# Patient Record
Sex: Female | Born: 1940
Health system: Southern US, Community
[De-identification: ages and names within clinical notes are randomized; demographics above are authoritative.]

## PROBLEM LIST (undated history)

## (undated) DIAGNOSIS — L905 Scar conditions and fibrosis of skin: Secondary | ICD-10-CM

## (undated) DIAGNOSIS — Z8616 Personal history of COVID-19: Secondary | ICD-10-CM

## (undated) DIAGNOSIS — M545 Low back pain, unspecified: Secondary | ICD-10-CM

## (undated) DIAGNOSIS — K219 Gastro-esophageal reflux disease without esophagitis: Secondary | ICD-10-CM

## (undated) DIAGNOSIS — M810 Age-related osteoporosis without current pathological fracture: Secondary | ICD-10-CM

## (undated) DIAGNOSIS — I10 Essential (primary) hypertension: Secondary | ICD-10-CM

## (undated) DIAGNOSIS — E782 Mixed hyperlipidemia: Secondary | ICD-10-CM

## (undated) DIAGNOSIS — E0590 Thyrotoxicosis, unspecified without thyrotoxic crisis or storm: Secondary | ICD-10-CM

## (undated) DIAGNOSIS — E559 Vitamin D deficiency, unspecified: Secondary | ICD-10-CM

## (undated) DIAGNOSIS — N189 Chronic kidney disease, unspecified: Secondary | ICD-10-CM

## (undated) DIAGNOSIS — E7800 Pure hypercholesterolemia, unspecified: Secondary | ICD-10-CM

## (undated) DIAGNOSIS — R7301 Impaired fasting glucose: Secondary | ICD-10-CM

## (undated) DIAGNOSIS — M1990 Unspecified osteoarthritis, unspecified site: Secondary | ICD-10-CM

## (undated) DIAGNOSIS — R202 Paresthesia of skin: Secondary | ICD-10-CM

## (undated) DIAGNOSIS — K2270 Barrett's esophagus without dysplasia: Secondary | ICD-10-CM

## (undated) DIAGNOSIS — K589 Irritable bowel syndrome without diarrhea: Secondary | ICD-10-CM

## (undated) DIAGNOSIS — F5101 Primary insomnia: Secondary | ICD-10-CM

## (undated) DIAGNOSIS — I129 Hypertensive chronic kidney disease with stage 1 through stage 4 chronic kidney disease, or unspecified chronic kidney disease: Secondary | ICD-10-CM

## (undated) DIAGNOSIS — H9190 Unspecified hearing loss, unspecified ear: Secondary | ICD-10-CM

## (undated) DIAGNOSIS — C801 Malignant (primary) neoplasm, unspecified: Secondary | ICD-10-CM

## (undated) HISTORY — PX: MULTIPLE TOOTH EXTRACTIONS: SHX2053

## (undated) HISTORY — DX: Paresthesia of skin: R20.2

## (undated) HISTORY — PX: APPENDECTOMY: SHX54

## (undated) HISTORY — DX: Hypertensive chronic kidney disease with stage 1 through stage 4 chronic kidney disease, or unspecified chronic kidney disease: I12.9

## (undated) HISTORY — PX: CARDIAC CATHETERIZATION: SHX172

## (undated) HISTORY — PX: CATARACT EXTRACTION W/ INTRAOCULAR LENS  IMPLANT, BILATERAL: SHX1307

## (undated) HISTORY — DX: Impaired fasting glucose: R73.01

## (undated) HISTORY — DX: Unspecified osteoarthritis, unspecified site: M19.90

## (undated) HISTORY — DX: Chronic kidney disease, unspecified: N18.9

## (undated) HISTORY — DX: Barrett's esophagus without dysplasia: K22.70

## (undated) HISTORY — DX: Personal history of COVID-19: Z86.16

## (undated) HISTORY — DX: Malignant (primary) neoplasm, unspecified: C80.1

## (undated) HISTORY — DX: Irritable bowel syndrome, unspecified: K58.9

## (undated) HISTORY — PX: COLONOSCOPY W/ BIOPSIES AND POLYPECTOMY: SHX1376

## (undated) HISTORY — DX: Scar conditions and fibrosis of skin: L90.5

## (undated) HISTORY — PX: BUNIONECTOMY: SHX129

## (undated) HISTORY — DX: Essential (primary) hypertension: I10

## (undated) HISTORY — DX: Mixed hyperlipidemia: E78.2

## (undated) HISTORY — DX: Primary insomnia: F51.01

## (undated) HISTORY — DX: Vitamin D deficiency, unspecified: E55.9

## (undated) HISTORY — DX: Age-related osteoporosis without current pathological fracture: M81.0

## (undated) HISTORY — DX: Unspecified hearing loss, unspecified ear: H91.90

## (undated) HISTORY — DX: Thyrotoxicosis, unspecified without thyrotoxic crisis or storm: E05.90

## (undated) HISTORY — DX: Low back pain, unspecified: M54.50

## (undated) HISTORY — PX: TUBAL LIGATION: SHX77

---
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
## 2011-11-15 DIAGNOSIS — I10 Essential (primary) hypertension: Secondary | ICD-10-CM | POA: Diagnosis not present

## 2011-11-15 DIAGNOSIS — R197 Diarrhea, unspecified: Secondary | ICD-10-CM | POA: Diagnosis not present

## 2012-03-12 DIAGNOSIS — M546 Pain in thoracic spine: Secondary | ICD-10-CM | POA: Diagnosis not present

## 2012-03-12 DIAGNOSIS — G4700 Insomnia, unspecified: Secondary | ICD-10-CM | POA: Diagnosis not present

## 2012-04-02 DIAGNOSIS — H18459 Nodular corneal degeneration, unspecified eye: Secondary | ICD-10-CM | POA: Diagnosis not present

## 2012-04-02 DIAGNOSIS — H2510 Age-related nuclear cataract, unspecified eye: Secondary | ICD-10-CM | POA: Diagnosis not present

## 2012-04-11 DIAGNOSIS — H18459 Nodular corneal degeneration, unspecified eye: Secondary | ICD-10-CM | POA: Diagnosis not present

## 2012-05-12 DIAGNOSIS — H2589 Other age-related cataract: Secondary | ICD-10-CM | POA: Diagnosis not present

## 2012-05-12 DIAGNOSIS — I10 Essential (primary) hypertension: Secondary | ICD-10-CM | POA: Diagnosis not present

## 2012-05-12 DIAGNOSIS — H2510 Age-related nuclear cataract, unspecified eye: Secondary | ICD-10-CM | POA: Diagnosis not present

## 2012-05-12 DIAGNOSIS — F17200 Nicotine dependence, unspecified, uncomplicated: Secondary | ICD-10-CM | POA: Diagnosis not present

## 2012-05-26 DIAGNOSIS — H2510 Age-related nuclear cataract, unspecified eye: Secondary | ICD-10-CM | POA: Diagnosis not present

## 2012-05-26 DIAGNOSIS — H2589 Other age-related cataract: Secondary | ICD-10-CM | POA: Diagnosis not present

## 2012-05-26 DIAGNOSIS — F17200 Nicotine dependence, unspecified, uncomplicated: Secondary | ICD-10-CM | POA: Diagnosis not present

## 2012-05-27 DIAGNOSIS — H2589 Other age-related cataract: Secondary | ICD-10-CM | POA: Diagnosis not present

## 2012-06-11 DIAGNOSIS — I10 Essential (primary) hypertension: Secondary | ICD-10-CM | POA: Diagnosis not present

## 2012-06-11 DIAGNOSIS — E785 Hyperlipidemia, unspecified: Secondary | ICD-10-CM | POA: Diagnosis not present

## 2012-07-30 DIAGNOSIS — I10 Essential (primary) hypertension: Secondary | ICD-10-CM | POA: Diagnosis not present

## 2012-07-30 DIAGNOSIS — N183 Chronic kidney disease, stage 3 unspecified: Secondary | ICD-10-CM | POA: Diagnosis not present

## 2012-07-30 DIAGNOSIS — M129 Arthropathy, unspecified: Secondary | ICD-10-CM | POA: Diagnosis not present

## 2012-08-01 DIAGNOSIS — N390 Urinary tract infection, site not specified: Secondary | ICD-10-CM | POA: Diagnosis not present

## 2012-08-01 DIAGNOSIS — I10 Essential (primary) hypertension: Secondary | ICD-10-CM | POA: Diagnosis not present

## 2012-08-01 DIAGNOSIS — N183 Chronic kidney disease, stage 3 unspecified: Secondary | ICD-10-CM | POA: Diagnosis not present

## 2012-08-08 DIAGNOSIS — N289 Disorder of kidney and ureter, unspecified: Secondary | ICD-10-CM | POA: Diagnosis not present

## 2012-08-08 DIAGNOSIS — N189 Chronic kidney disease, unspecified: Secondary | ICD-10-CM | POA: Diagnosis not present

## 2012-09-04 DIAGNOSIS — G4700 Insomnia, unspecified: Secondary | ICD-10-CM | POA: Diagnosis not present

## 2012-09-04 DIAGNOSIS — N182 Chronic kidney disease, stage 2 (mild): Secondary | ICD-10-CM | POA: Diagnosis not present

## 2012-09-04 DIAGNOSIS — Z23 Encounter for immunization: Secondary | ICD-10-CM | POA: Diagnosis not present

## 2012-09-04 DIAGNOSIS — I10 Essential (primary) hypertension: Secondary | ICD-10-CM | POA: Diagnosis not present

## 2012-09-09 DIAGNOSIS — H905 Unspecified sensorineural hearing loss: Secondary | ICD-10-CM | POA: Diagnosis not present

## 2012-09-09 DIAGNOSIS — H903 Sensorineural hearing loss, bilateral: Secondary | ICD-10-CM | POA: Diagnosis not present

## 2012-09-09 DIAGNOSIS — H9319 Tinnitus, unspecified ear: Secondary | ICD-10-CM | POA: Diagnosis not present

## 2012-10-29 DIAGNOSIS — N183 Chronic kidney disease, stage 3 unspecified: Secondary | ICD-10-CM | POA: Diagnosis not present

## 2012-10-29 DIAGNOSIS — I10 Essential (primary) hypertension: Secondary | ICD-10-CM | POA: Diagnosis not present

## 2012-10-29 DIAGNOSIS — M129 Arthropathy, unspecified: Secondary | ICD-10-CM | POA: Diagnosis not present

## 2012-12-11 DIAGNOSIS — N289 Disorder of kidney and ureter, unspecified: Secondary | ICD-10-CM | POA: Diagnosis not present

## 2012-12-11 DIAGNOSIS — R799 Abnormal finding of blood chemistry, unspecified: Secondary | ICD-10-CM | POA: Diagnosis not present

## 2012-12-25 DIAGNOSIS — I700 Atherosclerosis of aorta: Secondary | ICD-10-CM | POA: Diagnosis not present

## 2012-12-25 DIAGNOSIS — N269 Renal sclerosis, unspecified: Secondary | ICD-10-CM | POA: Diagnosis not present

## 2012-12-25 DIAGNOSIS — N289 Disorder of kidney and ureter, unspecified: Secondary | ICD-10-CM | POA: Diagnosis not present

## 2012-12-29 DIAGNOSIS — Z961 Presence of intraocular lens: Secondary | ICD-10-CM | POA: Diagnosis not present

## 2012-12-29 DIAGNOSIS — H26499 Other secondary cataract, unspecified eye: Secondary | ICD-10-CM | POA: Diagnosis not present

## 2013-01-01 DIAGNOSIS — H26499 Other secondary cataract, unspecified eye: Secondary | ICD-10-CM | POA: Diagnosis not present

## 2013-01-01 DIAGNOSIS — H04129 Dry eye syndrome of unspecified lacrimal gland: Secondary | ICD-10-CM | POA: Diagnosis not present

## 2013-01-05 DIAGNOSIS — R7989 Other specified abnormal findings of blood chemistry: Secondary | ICD-10-CM | POA: Diagnosis not present

## 2013-01-05 DIAGNOSIS — N2889 Other specified disorders of kidney and ureter: Secondary | ICD-10-CM | POA: Diagnosis not present

## 2013-01-05 DIAGNOSIS — N289 Disorder of kidney and ureter, unspecified: Secondary | ICD-10-CM | POA: Diagnosis not present

## 2013-01-05 DIAGNOSIS — N281 Cyst of kidney, acquired: Secondary | ICD-10-CM | POA: Diagnosis not present

## 2013-01-07 DIAGNOSIS — N289 Disorder of kidney and ureter, unspecified: Secondary | ICD-10-CM | POA: Diagnosis not present

## 2013-01-07 DIAGNOSIS — M129 Arthropathy, unspecified: Secondary | ICD-10-CM | POA: Diagnosis not present

## 2013-01-07 DIAGNOSIS — N183 Chronic kidney disease, stage 3 unspecified: Secondary | ICD-10-CM | POA: Diagnosis not present

## 2013-01-07 DIAGNOSIS — I10 Essential (primary) hypertension: Secondary | ICD-10-CM | POA: Diagnosis not present

## 2013-01-20 DIAGNOSIS — E785 Hyperlipidemia, unspecified: Secondary | ICD-10-CM | POA: Diagnosis not present

## 2013-01-20 DIAGNOSIS — H26499 Other secondary cataract, unspecified eye: Secondary | ICD-10-CM | POA: Diagnosis not present

## 2013-01-27 DIAGNOSIS — H26499 Other secondary cataract, unspecified eye: Secondary | ICD-10-CM | POA: Diagnosis not present

## 2013-01-27 DIAGNOSIS — Z9849 Cataract extraction status, unspecified eye: Secondary | ICD-10-CM | POA: Diagnosis not present

## 2013-01-27 DIAGNOSIS — Z961 Presence of intraocular lens: Secondary | ICD-10-CM | POA: Diagnosis not present

## 2013-03-29 DIAGNOSIS — N390 Urinary tract infection, site not specified: Secondary | ICD-10-CM | POA: Diagnosis not present

## 2013-03-29 DIAGNOSIS — R112 Nausea with vomiting, unspecified: Secondary | ICD-10-CM | POA: Diagnosis not present

## 2013-03-29 DIAGNOSIS — R197 Diarrhea, unspecified: Secondary | ICD-10-CM | POA: Diagnosis not present

## 2013-03-29 DIAGNOSIS — R300 Dysuria: Secondary | ICD-10-CM | POA: Diagnosis not present

## 2013-06-04 DIAGNOSIS — D485 Neoplasm of uncertain behavior of skin: Secondary | ICD-10-CM | POA: Diagnosis not present

## 2013-06-04 DIAGNOSIS — N182 Chronic kidney disease, stage 2 (mild): Secondary | ICD-10-CM | POA: Diagnosis not present

## 2013-06-04 DIAGNOSIS — E785 Hyperlipidemia, unspecified: Secondary | ICD-10-CM | POA: Diagnosis not present

## 2013-06-04 DIAGNOSIS — I10 Essential (primary) hypertension: Secondary | ICD-10-CM | POA: Diagnosis not present

## 2013-06-04 DIAGNOSIS — M47812 Spondylosis without myelopathy or radiculopathy, cervical region: Secondary | ICD-10-CM | POA: Diagnosis not present

## 2013-06-04 DIAGNOSIS — M542 Cervicalgia: Secondary | ICD-10-CM | POA: Diagnosis not present

## 2013-06-09 DIAGNOSIS — M47812 Spondylosis without myelopathy or radiculopathy, cervical region: Secondary | ICD-10-CM | POA: Diagnosis not present

## 2013-06-09 DIAGNOSIS — M542 Cervicalgia: Secondary | ICD-10-CM | POA: Diagnosis not present

## 2013-06-11 DIAGNOSIS — D485 Neoplasm of uncertain behavior of skin: Secondary | ICD-10-CM | POA: Diagnosis not present

## 2013-06-11 DIAGNOSIS — M542 Cervicalgia: Secondary | ICD-10-CM | POA: Diagnosis not present

## 2013-06-11 DIAGNOSIS — M47812 Spondylosis without myelopathy or radiculopathy, cervical region: Secondary | ICD-10-CM | POA: Diagnosis not present

## 2013-06-11 DIAGNOSIS — L821 Other seborrheic keratosis: Secondary | ICD-10-CM | POA: Diagnosis not present

## 2013-06-16 DIAGNOSIS — M542 Cervicalgia: Secondary | ICD-10-CM | POA: Diagnosis not present

## 2013-06-16 DIAGNOSIS — M47812 Spondylosis without myelopathy or radiculopathy, cervical region: Secondary | ICD-10-CM | POA: Diagnosis not present

## 2013-06-18 DIAGNOSIS — M542 Cervicalgia: Secondary | ICD-10-CM | POA: Diagnosis not present

## 2013-06-18 DIAGNOSIS — M47812 Spondylosis without myelopathy or radiculopathy, cervical region: Secondary | ICD-10-CM | POA: Diagnosis not present

## 2013-06-25 DIAGNOSIS — M542 Cervicalgia: Secondary | ICD-10-CM | POA: Diagnosis not present

## 2013-06-25 DIAGNOSIS — M47812 Spondylosis without myelopathy or radiculopathy, cervical region: Secondary | ICD-10-CM | POA: Diagnosis not present

## 2013-08-14 DIAGNOSIS — F17200 Nicotine dependence, unspecified, uncomplicated: Secondary | ICD-10-CM | POA: Diagnosis not present

## 2013-08-14 DIAGNOSIS — L84 Corns and callosities: Secondary | ICD-10-CM | POA: Diagnosis not present

## 2013-08-14 DIAGNOSIS — I10 Essential (primary) hypertension: Secondary | ICD-10-CM | POA: Diagnosis not present

## 2013-08-14 DIAGNOSIS — E7800 Pure hypercholesterolemia, unspecified: Secondary | ICD-10-CM | POA: Diagnosis not present

## 2013-08-14 DIAGNOSIS — G5600 Carpal tunnel syndrome, unspecified upper limb: Secondary | ICD-10-CM | POA: Diagnosis not present

## 2013-08-14 DIAGNOSIS — Z1211 Encounter for screening for malignant neoplasm of colon: Secondary | ICD-10-CM | POA: Diagnosis not present

## 2013-08-14 DIAGNOSIS — Z1239 Encounter for other screening for malignant neoplasm of breast: Secondary | ICD-10-CM | POA: Diagnosis not present

## 2013-08-14 DIAGNOSIS — Z13820 Encounter for screening for osteoporosis: Secondary | ICD-10-CM | POA: Diagnosis not present

## 2013-09-09 DIAGNOSIS — M810 Age-related osteoporosis without current pathological fracture: Secondary | ICD-10-CM | POA: Diagnosis not present

## 2013-09-09 DIAGNOSIS — Z1231 Encounter for screening mammogram for malignant neoplasm of breast: Secondary | ICD-10-CM | POA: Diagnosis not present

## 2013-09-09 DIAGNOSIS — M899 Disorder of bone, unspecified: Secondary | ICD-10-CM | POA: Diagnosis not present

## 2013-09-10 ENCOUNTER — Encounter (INDEPENDENT_AMBULATORY_CARE_PROVIDER_SITE_OTHER): Payer: Self-pay

## 2013-09-10 ENCOUNTER — Ambulatory Visit (INDEPENDENT_AMBULATORY_CARE_PROVIDER_SITE_OTHER): Payer: Medicare Other

## 2013-09-10 VITALS — BP 127/68 | HR 77 | Resp 18

## 2013-09-10 DIAGNOSIS — Q828 Other specified congenital malformations of skin: Secondary | ICD-10-CM | POA: Diagnosis not present

## 2013-09-10 DIAGNOSIS — M79672 Pain in left foot: Secondary | ICD-10-CM

## 2013-09-10 DIAGNOSIS — M2040 Other hammer toe(s) (acquired), unspecified foot: Secondary | ICD-10-CM

## 2013-09-10 DIAGNOSIS — M79609 Pain in unspecified limb: Secondary | ICD-10-CM

## 2013-09-10 DIAGNOSIS — M19079 Primary osteoarthritis, unspecified ankle and foot: Secondary | ICD-10-CM | POA: Diagnosis not present

## 2013-09-10 DIAGNOSIS — M79671 Pain in right foot: Secondary | ICD-10-CM

## 2013-09-10 DIAGNOSIS — M19071 Primary osteoarthritis, right ankle and foot: Secondary | ICD-10-CM

## 2013-09-10 NOTE — Progress Notes (Signed)
  Subjective:    Patient ID: Pamela Schwartz, female    DOB: [birth year], 72 y.o.   MRN: [id number]  HPI corns are on both 2nd toes and no burning or throbing, hurts if standing a lot and rubbing against each other and been going on for about middle of august of this year Patient has a previous history of bunion surgery correction with screw fixations done several years ago in [place]. Patient is also hammertoe repair second digits bilateral however has residual medial and dorsal displacement of the second toes with successful fusion on the left second toe, however the right second toe has a nonunion site. Both second toes have medial keratoses secondary to irritation against the hallux. Patient has adductovarus rotation is of all lesser digits 234 and 5.   Review of Systems  Constitutional: Negative.   HENT:       Ringing in the ears  Eyes: Negative.   Respiratory: Negative.   Cardiovascular: Negative.   Gastrointestinal: Negative.   Endocrine: Negative.   Genitourinary: Negative.   Musculoskeletal: Negative.   Skin: Negative.   Allergic/Immunologic: Negative.   Neurological: Negative.   Hematological: Negative.   Psychiatric/Behavioral: Negative.        Objective:   Physical Exam  Vitals reviewed. Constitutional: She is oriented to person, place, and time. She appears well-developed and well-nourished.  Cardiovascular:  Pulses:      Dorsalis pedis pulses are 2+ on the right side, and 2+ on the left side.       Posterior tibial pulses are 2+ on the right side, and 2+ on the left side.  Musculoskeletal:  Hallux is rectus with a medial healed incision line from previous bunionectomy. Bilateral. Patient's had previous hammertoe repairs with a nonunion second digit IP joint right. Has medial displacements at the MTP joints digits 234 and 5. Bilateral  Neurological: She is alert and oriented to person, place, and time. She has normal strength and normal reflexes.  Epicritic and  proprioceptive sensations intact and symmetric bilateral. Normal plantar response and DTRs noted.  Skin: Skin is warm and dry. No cyanosis. Nails show no clubbing.  Skin color pigment normal. Well-healed incisions noted on the hallux and second digits bilateral. They're keratoses medially second digit proximal IP joints bilateral  Psychiatric: She has a normal mood and affect. Her behavior is normal.          Assessment & Plan:  Assessment this time his result HAV deformity bilateral with successful surgery however has residual keratoses second digit to medial displacements at the MTP joints digits 234 and 5. Patient demonstrates and osteoarthropathy the forefoot midfoot and radiographically. There is adductovarus rotation of lesser digits 34 and 5 bilateral medial displacements at the MTP joints bilateral. May recommendations for a straight rather than curved last shoes. Dispensed some tube foam silicone toe shields for the second toes bilateral following debridement of keratoses both second toes.  Patient may be future candidate for hammertoe review with screw fixation second toe right foot in the future. Patient may also be candidate for tenotomy capsulotomy procedures lesser digits 345 if she continues to have difficulties. Followup on an as-needed basis if symptoms persist. May also be candidate for future palliative care and as-needed basis  Harriet Masson DPM

## 2013-09-10 NOTE — Patient Instructions (Signed)
Osteoarthritis Osteoarthritis is the most common form of arthritis. It is redness, soreness, and swelling (inflammation) affecting the cartilage. Cartilage acts as a cushion, covering the ends of bones where they meet to form a joint. CAUSES  Over time, the cartilage begins to wear away. This causes bone to rub on bone. This produces pain and stiffness in the affected joints. Factors that contribute to this problem are:  Excessive body weight.  Age.  Overuse of joints. SYMPTOMS   People with osteoarthritis usually experience joint pain, swelling, or stiffness.  Over time, the joint may lose its normal shape.  Small deposits of bone (osteophytes) may grow on the edges of the joint.  Bits of bone or cartilage can break off and float inside the joint space. This may cause more pain and damage.  Osteoarthritis can lead to depression, anxiety, feelings of helplessness, and limitations on daily activities. The most commonly affected joints are in the:  Ends of the fingers.  Thumbs.  Neck.  Lower back.  Knees.  Hips. DIAGNOSIS  Diagnosis is mostly based on your symptoms and exam. Tests may be helpful, including:  X-rays of the affected joint.  A computerized magnetic scan (MRI).  Blood tests to rule out other types of arthritis.  Joint fluid tests. This involves using a needle to draw fluid from the joint and examining the fluid under a microscope. TREATMENT  Goals of treatment are to control pain, improve joint function, maintain a normal body weight, and maintain a healthy lifestyle. Treatment approaches may include:  A prescribed exercise program with rest and joint relief.  Weight control with nutritional education.  Pain relief techniques such as:  Properly applied heat and cold.  Electric pulses delivered to nerve endings under the skin (transcutaneous electrical nerve stimulation, TENS).  Massage.  Certain supplements. Ask your caregiver before using any  supplements, especially in combination with prescribed drugs.  Medicines to control pain, such as:  Acetaminophen.  Nonsteroidal anti-inflammatory drugs (NSAIDs), such as naproxen.  Narcotic or central-acting agents, such as tramadol. This drug carries a risk of addiction and is generally prescribed for short-term use.  Corticosteroids. These can be given orally or as injection. This is a short-term treatment, not recommended for routine use.  Surgery to reposition the bones and relieve pain (osteotomy) or to remove loose pieces of bone and cartilage. Joint replacement may be needed in advanced states of osteoarthritis. HOME CARE INSTRUCTIONS  Your caregiver can recommend specific types of exercise. These may include:  Strengthening exercises. These are done to strengthen the muscles that support joints affected by arthritis. They can be performed with weights or with exercise bands to add resistance.  Aerobic activities. These are exercises, such as brisk walking or low-impact aerobics, that get your heart pumping. They can help keep your lungs and circulatory system in shape.  Range-of-motion activities. These keep your joints limber.  Balance and agility exercises. These help you maintain daily living skills. Learning about your condition and being actively involved in your care will help improve the course of your osteoarthritis. SEEK MEDICAL CARE IF:   You feel hot or your skin turns red.  You develop a rash in addition to your joint pain.  You have an oral temperature above 102 F (38.9 C). FOR MORE INFORMATION  National Institute of Arthritis and Musculoskeletal and Skin Diseases: www.niams.nih.gov National Institute on Aging: www.nia.nih.gov American College of Rheumatology: www.rheumatology.org Document Released: 10/29/2005 Document Revised: 01/21/2012 Document Reviewed: 02/09/2010 [employer] Patient Information 2014 [employer].  

## 2013-09-22 DIAGNOSIS — M542 Cervicalgia: Secondary | ICD-10-CM | POA: Diagnosis not present

## 2013-09-22 DIAGNOSIS — I10 Essential (primary) hypertension: Secondary | ICD-10-CM | POA: Diagnosis not present

## 2013-09-22 DIAGNOSIS — G5600 Carpal tunnel syndrome, unspecified upper limb: Secondary | ICD-10-CM | POA: Diagnosis not present

## 2013-09-22 DIAGNOSIS — M810 Age-related osteoporosis without current pathological fracture: Secondary | ICD-10-CM | POA: Diagnosis not present

## 2013-09-22 DIAGNOSIS — Z0000 Encounter for general adult medical examination without abnormal findings: Secondary | ICD-10-CM | POA: Diagnosis not present

## 2013-09-22 DIAGNOSIS — F17200 Nicotine dependence, unspecified, uncomplicated: Secondary | ICD-10-CM | POA: Diagnosis not present

## 2013-09-22 DIAGNOSIS — M216X9 Other acquired deformities of unspecified foot: Secondary | ICD-10-CM | POA: Diagnosis not present

## 2013-10-12 DIAGNOSIS — M542 Cervicalgia: Secondary | ICD-10-CM | POA: Diagnosis not present

## 2013-10-15 DIAGNOSIS — M542 Cervicalgia: Secondary | ICD-10-CM | POA: Diagnosis not present

## 2013-10-21 DIAGNOSIS — M542 Cervicalgia: Secondary | ICD-10-CM | POA: Diagnosis not present

## 2013-10-23 DIAGNOSIS — M542 Cervicalgia: Secondary | ICD-10-CM | POA: Diagnosis not present

## 2013-10-27 DIAGNOSIS — M542 Cervicalgia: Secondary | ICD-10-CM | POA: Diagnosis not present

## 2013-12-08 DIAGNOSIS — M542 Cervicalgia: Secondary | ICD-10-CM | POA: Diagnosis not present

## 2013-12-08 DIAGNOSIS — E7800 Pure hypercholesterolemia, unspecified: Secondary | ICD-10-CM | POA: Diagnosis not present

## 2013-12-08 DIAGNOSIS — M6530 Trigger finger, unspecified finger: Secondary | ICD-10-CM | POA: Diagnosis not present

## 2013-12-08 DIAGNOSIS — E559 Vitamin D deficiency, unspecified: Secondary | ICD-10-CM | POA: Diagnosis not present

## 2013-12-08 DIAGNOSIS — I10 Essential (primary) hypertension: Secondary | ICD-10-CM | POA: Diagnosis not present

## 2013-12-08 DIAGNOSIS — Z79899 Other long term (current) drug therapy: Secondary | ICD-10-CM | POA: Diagnosis not present

## 2013-12-08 DIAGNOSIS — G5600 Carpal tunnel syndrome, unspecified upper limb: Secondary | ICD-10-CM | POA: Diagnosis not present

## 2014-02-17 DIAGNOSIS — M940 Chondrocostal junction syndrome [Tietze]: Secondary | ICD-10-CM | POA: Diagnosis not present

## 2014-03-23 DIAGNOSIS — I10 Essential (primary) hypertension: Secondary | ICD-10-CM | POA: Diagnosis not present

## 2014-03-23 DIAGNOSIS — M542 Cervicalgia: Secondary | ICD-10-CM | POA: Diagnosis not present

## 2014-03-23 DIAGNOSIS — E538 Deficiency of other specified B group vitamins: Secondary | ICD-10-CM | POA: Diagnosis not present

## 2014-03-23 DIAGNOSIS — E7800 Pure hypercholesterolemia, unspecified: Secondary | ICD-10-CM | POA: Diagnosis not present

## 2014-03-23 DIAGNOSIS — E782 Mixed hyperlipidemia: Secondary | ICD-10-CM | POA: Diagnosis not present

## 2014-03-23 DIAGNOSIS — E559 Vitamin D deficiency, unspecified: Secondary | ICD-10-CM | POA: Diagnosis not present

## 2014-05-18 DIAGNOSIS — N281 Cyst of kidney, acquired: Secondary | ICD-10-CM | POA: Diagnosis not present

## 2014-05-18 DIAGNOSIS — R1011 Right upper quadrant pain: Secondary | ICD-10-CM | POA: Diagnosis not present

## 2014-05-18 DIAGNOSIS — R1013 Epigastric pain: Secondary | ICD-10-CM | POA: Diagnosis not present

## 2014-05-28 DIAGNOSIS — R1013 Epigastric pain: Secondary | ICD-10-CM | POA: Diagnosis not present

## 2014-05-28 DIAGNOSIS — R112 Nausea with vomiting, unspecified: Secondary | ICD-10-CM | POA: Diagnosis not present

## 2014-06-15 DIAGNOSIS — R112 Nausea with vomiting, unspecified: Secondary | ICD-10-CM | POA: Diagnosis not present

## 2014-06-15 DIAGNOSIS — K589 Irritable bowel syndrome without diarrhea: Secondary | ICD-10-CM | POA: Diagnosis not present

## 2014-06-15 DIAGNOSIS — R1013 Epigastric pain: Secondary | ICD-10-CM | POA: Diagnosis not present

## 2014-06-21 DIAGNOSIS — K2270 Barrett's esophagus without dysplasia: Secondary | ICD-10-CM | POA: Diagnosis not present

## 2014-06-21 DIAGNOSIS — D130 Benign neoplasm of esophagus: Secondary | ICD-10-CM | POA: Diagnosis not present

## 2014-06-21 DIAGNOSIS — N189 Chronic kidney disease, unspecified: Secondary | ICD-10-CM | POA: Diagnosis not present

## 2014-06-21 DIAGNOSIS — K449 Diaphragmatic hernia without obstruction or gangrene: Secondary | ICD-10-CM | POA: Diagnosis not present

## 2014-06-21 DIAGNOSIS — Z7982 Long term (current) use of aspirin: Secondary | ICD-10-CM | POA: Diagnosis not present

## 2014-06-21 DIAGNOSIS — E559 Vitamin D deficiency, unspecified: Secondary | ICD-10-CM | POA: Diagnosis not present

## 2014-06-21 DIAGNOSIS — D1330 Benign neoplasm of unspecified part of small intestine: Secondary | ICD-10-CM | POA: Diagnosis not present

## 2014-06-21 DIAGNOSIS — Z79899 Other long term (current) drug therapy: Secondary | ICD-10-CM | POA: Diagnosis not present

## 2014-06-21 DIAGNOSIS — M810 Age-related osteoporosis without current pathological fracture: Secondary | ICD-10-CM | POA: Diagnosis not present

## 2014-06-21 DIAGNOSIS — K589 Irritable bowel syndrome without diarrhea: Secondary | ICD-10-CM | POA: Diagnosis not present

## 2014-06-21 DIAGNOSIS — K2970 Gastritis, unspecified, without bleeding: Secondary | ICD-10-CM | POA: Diagnosis not present

## 2014-06-21 DIAGNOSIS — F17200 Nicotine dependence, unspecified, uncomplicated: Secondary | ICD-10-CM | POA: Diagnosis not present

## 2014-06-21 DIAGNOSIS — R112 Nausea with vomiting, unspecified: Secondary | ICD-10-CM | POA: Diagnosis not present

## 2014-06-21 DIAGNOSIS — H547 Unspecified visual loss: Secondary | ICD-10-CM | POA: Diagnosis not present

## 2014-06-21 DIAGNOSIS — R1013 Epigastric pain: Secondary | ICD-10-CM | POA: Diagnosis not present

## 2014-06-21 DIAGNOSIS — I129 Hypertensive chronic kidney disease with stage 1 through stage 4 chronic kidney disease, or unspecified chronic kidney disease: Secondary | ICD-10-CM | POA: Diagnosis not present

## 2014-06-28 DIAGNOSIS — E7800 Pure hypercholesterolemia, unspecified: Secondary | ICD-10-CM | POA: Diagnosis not present

## 2014-06-28 DIAGNOSIS — R197 Diarrhea, unspecified: Secondary | ICD-10-CM | POA: Diagnosis not present

## 2014-06-28 DIAGNOSIS — K2270 Barrett's esophagus without dysplasia: Secondary | ICD-10-CM | POA: Diagnosis not present

## 2014-06-28 DIAGNOSIS — E559 Vitamin D deficiency, unspecified: Secondary | ICD-10-CM | POA: Diagnosis not present

## 2014-06-28 DIAGNOSIS — I10 Essential (primary) hypertension: Secondary | ICD-10-CM | POA: Diagnosis not present

## 2014-06-28 DIAGNOSIS — K589 Irritable bowel syndrome without diarrhea: Secondary | ICD-10-CM | POA: Diagnosis not present

## 2014-07-12 DIAGNOSIS — I129 Hypertensive chronic kidney disease with stage 1 through stage 4 chronic kidney disease, or unspecified chronic kidney disease: Secondary | ICD-10-CM | POA: Diagnosis not present

## 2014-07-12 DIAGNOSIS — D128 Benign neoplasm of rectum: Secondary | ICD-10-CM | POA: Diagnosis not present

## 2014-07-12 DIAGNOSIS — D175 Benign lipomatous neoplasm of intra-abdominal organs: Secondary | ICD-10-CM | POA: Diagnosis not present

## 2014-07-12 DIAGNOSIS — K5730 Diverticulosis of large intestine without perforation or abscess without bleeding: Secondary | ICD-10-CM | POA: Diagnosis not present

## 2014-07-12 DIAGNOSIS — D126 Benign neoplasm of colon, unspecified: Secondary | ICD-10-CM | POA: Diagnosis not present

## 2014-07-12 DIAGNOSIS — M810 Age-related osteoporosis without current pathological fracture: Secondary | ICD-10-CM | POA: Diagnosis not present

## 2014-07-12 DIAGNOSIS — Z1211 Encounter for screening for malignant neoplasm of colon: Secondary | ICD-10-CM | POA: Diagnosis not present

## 2014-07-12 DIAGNOSIS — N189 Chronic kidney disease, unspecified: Secondary | ICD-10-CM | POA: Diagnosis not present

## 2014-07-12 DIAGNOSIS — D129 Benign neoplasm of anus and anal canal: Secondary | ICD-10-CM | POA: Diagnosis not present

## 2014-07-12 DIAGNOSIS — Z79899 Other long term (current) drug therapy: Secondary | ICD-10-CM | POA: Diagnosis not present

## 2014-07-12 DIAGNOSIS — F17200 Nicotine dependence, unspecified, uncomplicated: Secondary | ICD-10-CM | POA: Diagnosis not present

## 2014-07-12 DIAGNOSIS — Z7982 Long term (current) use of aspirin: Secondary | ICD-10-CM | POA: Diagnosis not present

## 2014-07-12 DIAGNOSIS — R1013 Epigastric pain: Secondary | ICD-10-CM | POA: Diagnosis not present

## 2014-07-12 DIAGNOSIS — K589 Irritable bowel syndrome without diarrhea: Secondary | ICD-10-CM | POA: Diagnosis not present

## 2014-07-12 DIAGNOSIS — K648 Other hemorrhoids: Secondary | ICD-10-CM | POA: Diagnosis not present

## 2014-07-12 DIAGNOSIS — E559 Vitamin D deficiency, unspecified: Secondary | ICD-10-CM | POA: Diagnosis not present

## 2014-07-13 DIAGNOSIS — Z23 Encounter for immunization: Secondary | ICD-10-CM | POA: Diagnosis not present

## 2014-08-19 DIAGNOSIS — H3531 Nonexudative age-related macular degeneration: Secondary | ICD-10-CM | POA: Diagnosis not present

## 2014-10-05 DIAGNOSIS — F17210 Nicotine dependence, cigarettes, uncomplicated: Secondary | ICD-10-CM | POA: Diagnosis not present

## 2014-10-05 DIAGNOSIS — E780 Pure hypercholesterolemia: Secondary | ICD-10-CM | POA: Diagnosis not present

## 2014-10-05 DIAGNOSIS — Z1231 Encounter for screening mammogram for malignant neoplasm of breast: Secondary | ICD-10-CM | POA: Diagnosis not present

## 2014-10-05 DIAGNOSIS — K580 Irritable bowel syndrome with diarrhea: Secondary | ICD-10-CM | POA: Diagnosis not present

## 2014-10-05 DIAGNOSIS — K2270 Barrett's esophagus without dysplasia: Secondary | ICD-10-CM | POA: Diagnosis not present

## 2014-10-05 DIAGNOSIS — I10 Essential (primary) hypertension: Secondary | ICD-10-CM | POA: Diagnosis not present

## 2014-10-05 DIAGNOSIS — E559 Vitamin D deficiency, unspecified: Secondary | ICD-10-CM | POA: Diagnosis not present

## 2014-10-19 DIAGNOSIS — Z23 Encounter for immunization: Secondary | ICD-10-CM | POA: Diagnosis not present

## 2014-10-20 DIAGNOSIS — Z1231 Encounter for screening mammogram for malignant neoplasm of breast: Secondary | ICD-10-CM | POA: Diagnosis not present

## 2014-12-09 DIAGNOSIS — I493 Ventricular premature depolarization: Secondary | ICD-10-CM | POA: Diagnosis not present

## 2014-12-09 DIAGNOSIS — Z0000 Encounter for general adult medical examination without abnormal findings: Secondary | ICD-10-CM | POA: Diagnosis not present

## 2014-12-09 DIAGNOSIS — M810 Age-related osteoporosis without current pathological fracture: Secondary | ICD-10-CM | POA: Diagnosis not present

## 2014-12-09 DIAGNOSIS — F17218 Nicotine dependence, cigarettes, with other nicotine-induced disorders: Secondary | ICD-10-CM | POA: Diagnosis not present

## 2014-12-09 DIAGNOSIS — R05 Cough: Secondary | ICD-10-CM | POA: Diagnosis not present

## 2014-12-09 DIAGNOSIS — F17210 Nicotine dependence, cigarettes, uncomplicated: Secondary | ICD-10-CM | POA: Diagnosis not present

## 2014-12-13 DIAGNOSIS — R05 Cough: Secondary | ICD-10-CM | POA: Diagnosis not present

## 2014-12-13 DIAGNOSIS — F17200 Nicotine dependence, unspecified, uncomplicated: Secondary | ICD-10-CM | POA: Diagnosis not present

## 2014-12-27 DIAGNOSIS — R21 Rash and other nonspecific skin eruption: Secondary | ICD-10-CM | POA: Diagnosis not present

## 2015-01-11 DIAGNOSIS — I10 Essential (primary) hypertension: Secondary | ICD-10-CM | POA: Diagnosis not present

## 2015-01-11 DIAGNOSIS — F17210 Nicotine dependence, cigarettes, uncomplicated: Secondary | ICD-10-CM | POA: Diagnosis not present

## 2015-01-11 DIAGNOSIS — R21 Rash and other nonspecific skin eruption: Secondary | ICD-10-CM | POA: Diagnosis not present

## 2015-01-11 DIAGNOSIS — E559 Vitamin D deficiency, unspecified: Secondary | ICD-10-CM | POA: Diagnosis not present

## 2015-01-11 DIAGNOSIS — E780 Pure hypercholesterolemia: Secondary | ICD-10-CM | POA: Diagnosis not present

## 2015-01-11 DIAGNOSIS — I493 Ventricular premature depolarization: Secondary | ICD-10-CM | POA: Diagnosis not present

## 2015-01-11 DIAGNOSIS — K580 Irritable bowel syndrome with diarrhea: Secondary | ICD-10-CM | POA: Diagnosis not present

## 2015-01-11 DIAGNOSIS — K2270 Barrett's esophagus without dysplasia: Secondary | ICD-10-CM | POA: Diagnosis not present

## 2015-02-08 DIAGNOSIS — L02215 Cutaneous abscess of perineum: Secondary | ICD-10-CM | POA: Diagnosis not present

## 2015-02-08 DIAGNOSIS — L03319 Cellulitis of trunk, unspecified: Secondary | ICD-10-CM | POA: Diagnosis not present

## 2015-02-11 DIAGNOSIS — L02215 Cutaneous abscess of perineum: Secondary | ICD-10-CM | POA: Diagnosis not present

## 2015-04-15 DIAGNOSIS — J209 Acute bronchitis, unspecified: Secondary | ICD-10-CM | POA: Diagnosis not present

## 2015-04-15 DIAGNOSIS — J0100 Acute maxillary sinusitis, unspecified: Secondary | ICD-10-CM | POA: Diagnosis not present

## 2015-04-18 DIAGNOSIS — R05 Cough: Secondary | ICD-10-CM | POA: Diagnosis not present

## 2015-04-18 DIAGNOSIS — J4541 Moderate persistent asthma with (acute) exacerbation: Secondary | ICD-10-CM | POA: Diagnosis not present

## 2015-04-18 DIAGNOSIS — J0100 Acute maxillary sinusitis, unspecified: Secondary | ICD-10-CM | POA: Diagnosis not present

## 2015-04-18 DIAGNOSIS — J209 Acute bronchitis, unspecified: Secondary | ICD-10-CM | POA: Diagnosis not present

## 2015-04-21 DIAGNOSIS — I10 Essential (primary) hypertension: Secondary | ICD-10-CM | POA: Diagnosis not present

## 2015-04-21 DIAGNOSIS — I493 Ventricular premature depolarization: Secondary | ICD-10-CM | POA: Diagnosis not present

## 2015-04-21 DIAGNOSIS — M542 Cervicalgia: Secondary | ICD-10-CM | POA: Diagnosis not present

## 2015-04-21 DIAGNOSIS — E780 Pure hypercholesterolemia: Secondary | ICD-10-CM | POA: Diagnosis not present

## 2015-04-21 DIAGNOSIS — J4541 Moderate persistent asthma with (acute) exacerbation: Secondary | ICD-10-CM | POA: Diagnosis not present

## 2015-04-21 DIAGNOSIS — J3089 Other allergic rhinitis: Secondary | ICD-10-CM | POA: Diagnosis not present

## 2015-05-26 DIAGNOSIS — H524 Presbyopia: Secondary | ICD-10-CM | POA: Diagnosis not present

## 2015-05-26 DIAGNOSIS — H3531 Nonexudative age-related macular degeneration: Secondary | ICD-10-CM | POA: Diagnosis not present

## 2015-07-25 DIAGNOSIS — E780 Pure hypercholesterolemia: Secondary | ICD-10-CM | POA: Diagnosis not present

## 2015-07-25 DIAGNOSIS — I10 Essential (primary) hypertension: Secondary | ICD-10-CM | POA: Diagnosis not present

## 2015-07-25 DIAGNOSIS — R7301 Impaired fasting glucose: Secondary | ICD-10-CM | POA: Diagnosis not present

## 2015-07-28 DIAGNOSIS — S61011A Laceration without foreign body of right thumb without damage to nail, initial encounter: Secondary | ICD-10-CM | POA: Diagnosis not present

## 2015-07-28 DIAGNOSIS — I10 Essential (primary) hypertension: Secondary | ICD-10-CM | POA: Diagnosis not present

## 2015-07-28 DIAGNOSIS — E780 Pure hypercholesterolemia: Secondary | ICD-10-CM | POA: Diagnosis not present

## 2015-07-28 DIAGNOSIS — G5600 Carpal tunnel syndrome, unspecified upper limb: Secondary | ICD-10-CM | POA: Diagnosis not present

## 2015-07-28 DIAGNOSIS — Z23 Encounter for immunization: Secondary | ICD-10-CM | POA: Diagnosis not present

## 2015-07-28 DIAGNOSIS — M542 Cervicalgia: Secondary | ICD-10-CM | POA: Diagnosis not present

## 2015-07-28 DIAGNOSIS — R7301 Impaired fasting glucose: Secondary | ICD-10-CM | POA: Diagnosis not present

## 2015-08-29 DIAGNOSIS — I10 Essential (primary) hypertension: Secondary | ICD-10-CM | POA: Diagnosis not present

## 2015-08-29 DIAGNOSIS — R001 Bradycardia, unspecified: Secondary | ICD-10-CM | POA: Diagnosis not present

## 2015-08-29 DIAGNOSIS — R0789 Other chest pain: Secondary | ICD-10-CM | POA: Diagnosis not present

## 2015-08-29 DIAGNOSIS — M542 Cervicalgia: Secondary | ICD-10-CM | POA: Diagnosis not present

## 2015-08-30 DIAGNOSIS — R001 Bradycardia, unspecified: Secondary | ICD-10-CM | POA: Diagnosis not present

## 2015-09-13 DIAGNOSIS — R0789 Other chest pain: Secondary | ICD-10-CM | POA: Diagnosis not present

## 2015-09-30 DIAGNOSIS — M47812 Spondylosis without myelopathy or radiculopathy, cervical region: Secondary | ICD-10-CM | POA: Diagnosis not present

## 2015-09-30 DIAGNOSIS — M542 Cervicalgia: Secondary | ICD-10-CM | POA: Diagnosis not present

## 2015-10-03 DIAGNOSIS — J3089 Other allergic rhinitis: Secondary | ICD-10-CM | POA: Diagnosis not present

## 2015-10-03 DIAGNOSIS — M50123 Cervical disc disorder at C6-C7 level with radiculopathy: Secondary | ICD-10-CM | POA: Diagnosis not present

## 2015-10-03 DIAGNOSIS — R0789 Other chest pain: Secondary | ICD-10-CM | POA: Diagnosis not present

## 2015-10-03 DIAGNOSIS — I10 Essential (primary) hypertension: Secondary | ICD-10-CM | POA: Diagnosis not present

## 2015-10-03 DIAGNOSIS — R008 Other abnormalities of heart beat: Secondary | ICD-10-CM | POA: Diagnosis not present

## 2015-10-03 DIAGNOSIS — M50122 Cervical disc disorder at C5-C6 level with radiculopathy: Secondary | ICD-10-CM | POA: Diagnosis not present

## 2015-10-13 DIAGNOSIS — M47817 Spondylosis without myelopathy or radiculopathy, lumbosacral region: Secondary | ICD-10-CM | POA: Diagnosis not present

## 2015-10-13 DIAGNOSIS — M50122 Cervical disc disorder at C5-C6 level with radiculopathy: Secondary | ICD-10-CM | POA: Diagnosis not present

## 2015-10-13 DIAGNOSIS — M50123 Cervical disc disorder at C6-C7 level with radiculopathy: Secondary | ICD-10-CM | POA: Diagnosis not present

## 2015-10-13 DIAGNOSIS — M47892 Other spondylosis, cervical region: Secondary | ICD-10-CM | POA: Diagnosis not present

## 2015-10-19 DIAGNOSIS — E042 Nontoxic multinodular goiter: Secondary | ICD-10-CM | POA: Diagnosis not present

## 2015-10-20 DIAGNOSIS — Z6825 Body mass index (BMI) 25.0-25.9, adult: Secondary | ICD-10-CM | POA: Diagnosis not present

## 2015-10-20 DIAGNOSIS — M5412 Radiculopathy, cervical region: Secondary | ICD-10-CM | POA: Diagnosis not present

## 2015-10-31 DIAGNOSIS — E041 Nontoxic single thyroid nodule: Secondary | ICD-10-CM | POA: Diagnosis not present

## 2015-10-31 DIAGNOSIS — E042 Nontoxic multinodular goiter: Secondary | ICD-10-CM | POA: Diagnosis not present

## 2015-11-22 DIAGNOSIS — M5412 Radiculopathy, cervical region: Secondary | ICD-10-CM | POA: Diagnosis not present

## 2015-12-12 DIAGNOSIS — Z0000 Encounter for general adult medical examination without abnormal findings: Secondary | ICD-10-CM | POA: Diagnosis not present

## 2015-12-12 DIAGNOSIS — L658 Other specified nonscarring hair loss: Secondary | ICD-10-CM | POA: Diagnosis not present

## 2015-12-12 DIAGNOSIS — E782 Mixed hyperlipidemia: Secondary | ICD-10-CM | POA: Diagnosis not present

## 2015-12-12 DIAGNOSIS — I7389 Other specified peripheral vascular diseases: Secondary | ICD-10-CM | POA: Diagnosis not present

## 2015-12-12 DIAGNOSIS — I10 Essential (primary) hypertension: Secondary | ICD-10-CM | POA: Diagnosis not present

## 2015-12-12 DIAGNOSIS — Z6825 Body mass index (BMI) 25.0-25.9, adult: Secondary | ICD-10-CM | POA: Diagnosis not present

## 2015-12-12 DIAGNOSIS — M810 Age-related osteoporosis without current pathological fracture: Secondary | ICD-10-CM | POA: Diagnosis not present

## 2015-12-12 DIAGNOSIS — R7301 Impaired fasting glucose: Secondary | ICD-10-CM | POA: Diagnosis not present

## 2015-12-12 DIAGNOSIS — Z1211 Encounter for screening for malignant neoplasm of colon: Secondary | ICD-10-CM | POA: Diagnosis not present

## 2015-12-13 DIAGNOSIS — M5412 Radiculopathy, cervical region: Secondary | ICD-10-CM | POA: Diagnosis not present

## 2015-12-19 DIAGNOSIS — Z780 Asymptomatic menopausal state: Secondary | ICD-10-CM | POA: Diagnosis not present

## 2015-12-19 DIAGNOSIS — M8589 Other specified disorders of bone density and structure, multiple sites: Secondary | ICD-10-CM | POA: Diagnosis not present

## 2015-12-22 DIAGNOSIS — I739 Peripheral vascular disease, unspecified: Secondary | ICD-10-CM | POA: Diagnosis not present

## 2015-12-22 DIAGNOSIS — I743 Embolism and thrombosis of arteries of the lower extremities: Secondary | ICD-10-CM | POA: Diagnosis not present

## 2016-01-04 DIAGNOSIS — I10 Essential (primary) hypertension: Secondary | ICD-10-CM | POA: Diagnosis not present

## 2016-01-04 DIAGNOSIS — Z6825 Body mass index (BMI) 25.0-25.9, adult: Secondary | ICD-10-CM | POA: Diagnosis not present

## 2016-01-04 DIAGNOSIS — M47812 Spondylosis without myelopathy or radiculopathy, cervical region: Secondary | ICD-10-CM | POA: Diagnosis not present

## 2016-01-05 DIAGNOSIS — I10 Essential (primary) hypertension: Secondary | ICD-10-CM | POA: Diagnosis not present

## 2016-01-05 DIAGNOSIS — I70203 Unspecified atherosclerosis of native arteries of extremities, bilateral legs: Secondary | ICD-10-CM | POA: Diagnosis not present

## 2016-02-14 DIAGNOSIS — K210 Gastro-esophageal reflux disease with esophagitis: Secondary | ICD-10-CM | POA: Diagnosis not present

## 2016-02-14 DIAGNOSIS — K591 Functional diarrhea: Secondary | ICD-10-CM | POA: Diagnosis not present

## 2016-02-14 DIAGNOSIS — A09 Infectious gastroenteritis and colitis, unspecified: Secondary | ICD-10-CM | POA: Diagnosis not present

## 2016-02-14 DIAGNOSIS — R197 Diarrhea, unspecified: Secondary | ICD-10-CM | POA: Diagnosis not present

## 2016-02-27 DIAGNOSIS — H353131 Nonexudative age-related macular degeneration, bilateral, early dry stage: Secondary | ICD-10-CM | POA: Diagnosis not present

## 2016-02-27 DIAGNOSIS — H26493 Other secondary cataract, bilateral: Secondary | ICD-10-CM | POA: Diagnosis not present

## 2016-03-02 ENCOUNTER — Other Ambulatory Visit: Payer: Self-pay | Admitting: Neurosurgery

## 2016-03-02 DIAGNOSIS — Z6825 Body mass index (BMI) 25.0-25.9, adult: Secondary | ICD-10-CM | POA: Diagnosis not present

## 2016-03-02 DIAGNOSIS — I10 Essential (primary) hypertension: Secondary | ICD-10-CM | POA: Diagnosis not present

## 2016-03-02 DIAGNOSIS — M47812 Spondylosis without myelopathy or radiculopathy, cervical region: Secondary | ICD-10-CM | POA: Diagnosis not present

## 2016-03-05 ENCOUNTER — Encounter (HOSPITAL_COMMUNITY): Payer: Self-pay | Admitting: *Deleted

## 2016-03-05 NOTE — Progress Notes (Signed)
Spoke with medical staff, Nira Conn, from Dr. Burke Keels office regarding pt cardiac records requested. According to Nira Conn, she can only send records from office visit in 2012; there was an electronic conversion in 2011 and she is unable to get any EKG's or cardiac records from that period and prior. Heather to fax what records she has on file.

## 2016-03-05 NOTE — Progress Notes (Signed)
Pt denies SOB, chest pain, and being under the care of a cardiologist. Pt denies having an echo but stated that a cardiac cath was done " 5 years ago at [facility] in [place], [place] by Dr. Claudius Sis, everything was fine, nothing was done." Pt stated that an EKG and CXR was done within the last year at [facility]; records requested for all diagnostic studies. Pt made aware to stop taking Aspirin, vitamins, fish oil and herbal medications. Do not take any NSAIDs ie: Ibuprofen, Advil, Naproxen, BC and Goody Powder or any medication containing Aspirin. Pt verbalized understanding of all pre-op instructions.

## 2016-03-06 ENCOUNTER — Inpatient Hospital Stay (HOSPITAL_COMMUNITY): Payer: Medicare Other | Admitting: Anesthesiology

## 2016-03-06 ENCOUNTER — Encounter (HOSPITAL_COMMUNITY): Payer: Self-pay | Admitting: *Deleted

## 2016-03-06 ENCOUNTER — Encounter (HOSPITAL_COMMUNITY)
Admission: RE | Disposition: A | Discharge status: Home / Self Care | Payer: Self-pay | Source: Ambulatory Visit | Attending: Neurosurgery

## 2016-03-06 ENCOUNTER — Inpatient Hospital Stay (HOSPITAL_COMMUNITY)
Admission: RE | Admit: 2016-03-06 | Discharge: 2016-03-07 | DRG: 473 | Disposition: A | Discharge status: Home / Self Care | Payer: Medicare Other | Source: Ambulatory Visit | Attending: Neurosurgery | Admitting: Neurosurgery

## 2016-03-06 ENCOUNTER — Inpatient Hospital Stay (HOSPITAL_COMMUNITY): Payer: Medicare Other

## 2016-03-06 DIAGNOSIS — Z961 Presence of intraocular lens: Secondary | ICD-10-CM | POA: Diagnosis present

## 2016-03-06 DIAGNOSIS — E7800 Pure hypercholesterolemia, unspecified: Secondary | ICD-10-CM | POA: Diagnosis present

## 2016-03-06 DIAGNOSIS — I129 Hypertensive chronic kidney disease with stage 1 through stage 4 chronic kidney disease, or unspecified chronic kidney disease: Secondary | ICD-10-CM | POA: Diagnosis present

## 2016-03-06 DIAGNOSIS — Z888 Allergy status to other drugs, medicaments and biological substances status: Secondary | ICD-10-CM

## 2016-03-06 DIAGNOSIS — M479 Spondylosis, unspecified: Secondary | ICD-10-CM | POA: Diagnosis present

## 2016-03-06 DIAGNOSIS — K219 Gastro-esophageal reflux disease without esophagitis: Secondary | ICD-10-CM | POA: Diagnosis present

## 2016-03-06 DIAGNOSIS — Z9842 Cataract extraction status, left eye: Secondary | ICD-10-CM

## 2016-03-06 DIAGNOSIS — N189 Chronic kidney disease, unspecified: Secondary | ICD-10-CM | POA: Diagnosis present

## 2016-03-06 DIAGNOSIS — Z7982 Long term (current) use of aspirin: Secondary | ICD-10-CM

## 2016-03-06 DIAGNOSIS — Z9841 Cataract extraction status, right eye: Secondary | ICD-10-CM

## 2016-03-06 DIAGNOSIS — M79601 Pain in right arm: Secondary | ICD-10-CM | POA: Diagnosis present

## 2016-03-06 DIAGNOSIS — Z87891 Personal history of nicotine dependence: Secondary | ICD-10-CM | POA: Diagnosis not present

## 2016-03-06 DIAGNOSIS — M4322 Fusion of spine, cervical region: Secondary | ICD-10-CM | POA: Diagnosis not present

## 2016-03-06 DIAGNOSIS — Z91048 Other nonmedicinal substance allergy status: Secondary | ICD-10-CM | POA: Diagnosis not present

## 2016-03-06 DIAGNOSIS — M542 Cervicalgia: Secondary | ICD-10-CM

## 2016-03-06 DIAGNOSIS — H9190 Unspecified hearing loss, unspecified ear: Secondary | ICD-10-CM | POA: Diagnosis present

## 2016-03-06 DIAGNOSIS — R531 Weakness: Secondary | ICD-10-CM | POA: Diagnosis present

## 2016-03-06 DIAGNOSIS — Z79899 Other long term (current) drug therapy: Secondary | ICD-10-CM

## 2016-03-06 DIAGNOSIS — K589 Irritable bowel syndrome without diarrhea: Secondary | ICD-10-CM | POA: Diagnosis present

## 2016-03-06 DIAGNOSIS — M47812 Spondylosis without myelopathy or radiculopathy, cervical region: Secondary | ICD-10-CM | POA: Diagnosis not present

## 2016-03-06 DIAGNOSIS — M79602 Pain in left arm: Secondary | ICD-10-CM | POA: Diagnosis present

## 2016-03-06 DIAGNOSIS — M4802 Spinal stenosis, cervical region: Principal | ICD-10-CM | POA: Diagnosis present

## 2016-03-06 DIAGNOSIS — M5412 Radiculopathy, cervical region: Secondary | ICD-10-CM | POA: Diagnosis present

## 2016-03-06 HISTORY — DX: Spinal stenosis, cervical region: M48.02

## 2016-03-06 HISTORY — DX: Pure hypercholesterolemia, unspecified: E78.00

## 2016-03-06 HISTORY — PX: ANTERIOR CERVICAL DECOMP/DISCECTOMY FUSION: SHX1161

## 2016-03-06 HISTORY — DX: Gastro-esophageal reflux disease without esophagitis: K21.9

## 2016-03-06 LAB — BASIC METABOLIC PANEL
Anion gap: 12 (ref 5–15)
BUN: 25 mg/dL — ABNORMAL HIGH (ref 6–20)
CO2: 23 mmol/L (ref 22–32)
Calcium: 9.9 mg/dL (ref 8.9–10.3)
Chloride: 106 mmol/L (ref 101–111)
Creatinine, Ser: 1.56 mg/dL — ABNORMAL HIGH (ref 0.44–1.00)
GFR calc Af Amer: 37 mL/min — ABNORMAL LOW (ref >60)
GFR calc non Af Amer: 32 mL/min — ABNORMAL LOW (ref >60)
Glucose, Bld: 106 mg/dL — ABNORMAL HIGH (ref 65–99)
Potassium: 4.4 mmol/L (ref 3.5–5.1)
Sodium: 141 mmol/L (ref 135–145)

## 2016-03-06 LAB — CBC WITH DIFFERENTIAL/PLATELET
Basophils Absolute: 0 10*3/uL (ref 0.0–0.1)
Basophils Relative: 0 %
Eosinophils Absolute: 0.5 10*3/uL (ref 0.0–0.7)
Eosinophils Relative: 6 %
HCT: 42.1 % (ref 36.0–46.0)
Hemoglobin: 13.3 g/dL (ref 12.0–15.0)
Lymphocytes Relative: 28 %
Lymphs Abs: 2.2 10*3/uL (ref 0.7–4.0)
MCH: 29.2 pg (ref 26.0–34.0)
MCHC: 31.6 g/dL (ref 30.0–36.0)
MCV: 92.5 fL (ref 78.0–100.0)
Monocytes Absolute: 0.6 10*3/uL (ref 0.1–1.0)
Monocytes Relative: 8 %
Neutro Abs: 4.6 10*3/uL (ref 1.7–7.7)
Neutrophils Relative %: 58 %
Platelets: 204 10*3/uL (ref 150–400)
RBC: 4.55 MIL/uL (ref 3.87–5.11)
RDW: 12.7 % (ref 11.5–15.5)
WBC: 8 10*3/uL (ref 4.0–10.5)

## 2016-03-06 LAB — SURGICAL PCR SCREEN
MRSA, PCR: NEGATIVE
Staphylococcus aureus: NEGATIVE

## 2016-03-06 SURGERY — ANTERIOR CERVICAL DECOMPRESSION/DISCECTOMY FUSION 2 LEVELS
Anesthesia: General | Site: Neck

## 2016-03-06 MED ORDER — AMLODIPINE BESYLATE 10 MG PO TABS
10.0000 mg | ORAL_TABLET | Freq: Every day | ORAL | Status: DC
  Filled 2016-03-06: qty 1

## 2016-03-06 MED ORDER — FENTANYL CITRATE (PF) 250 MCG/5ML IJ SOLN
INTRAMUSCULAR | Status: AC
  Filled 2016-03-06: qty 5

## 2016-03-06 MED ORDER — EPHEDRINE SULFATE 50 MG/ML IJ SOLN
INTRAMUSCULAR | Status: DC | PRN
  Administered 2016-03-06: 10 mg via INTRAVENOUS

## 2016-03-06 MED ORDER — SODIUM CHLORIDE 0.9 % IV SOLN: 250.0000 mL | INTRAVENOUS | Status: DC

## 2016-03-06 MED ORDER — SODIUM CHLORIDE 0.9% FLUSH: 3.0000 mL | INTRAVENOUS | Status: DC | PRN

## 2016-03-06 MED ORDER — FENTANYL CITRATE (PF) 100 MCG/2ML IJ SOLN
INTRAMUSCULAR | Status: AC
  Filled 2016-03-06: qty 2

## 2016-03-06 MED ORDER — MUPIROCIN 2 % EX OINT
TOPICAL_OINTMENT | Freq: Once | CUTANEOUS | Status: AC
  Administered 2016-03-06: 1 via NASAL

## 2016-03-06 MED ORDER — MENTHOL 3 MG MT LOZG: 1.0000 | LOZENGE | OROMUCOSAL | Status: DC | PRN

## 2016-03-06 MED ORDER — PROPOFOL 10 MG/ML IV BOLUS
INTRAVENOUS | Status: DC | PRN
  Administered 2016-03-06: 120 mg via INTRAVENOUS

## 2016-03-06 MED ORDER — ONDANSETRON HCL 4 MG/2ML IJ SOLN: 4.0000 mg | Freq: Once | INTRAMUSCULAR | Status: DC | PRN

## 2016-03-06 MED ORDER — CEFAZOLIN SODIUM-DEXTROSE 2-4 GM/100ML-% IV SOLN
2.0000 g | INTRAVENOUS | Status: AC
  Administered 2016-03-06: 2 g via INTRAVENOUS
  Filled 2016-03-06: qty 100

## 2016-03-06 MED ORDER — DICYCLOMINE HCL 10 MG PO CAPS
20.0000 mg | ORAL_CAPSULE | Freq: Three times a day (TID) | ORAL | Status: DC
  Filled 2016-03-06 (×2): qty 2

## 2016-03-06 MED ORDER — OXYCODONE-ACETAMINOPHEN 5-325 MG PO TABS
1.0000 | ORAL_TABLET | ORAL | Status: DC | PRN
  Administered 2016-03-06 – 2016-03-07 (×3): 2 via ORAL
  Filled 2016-03-06 (×2): qty 2

## 2016-03-06 MED ORDER — ACETAMINOPHEN 10 MG/ML IV SOLN
INTRAVENOUS | Status: AC
  Administered 2016-03-06: 1000 mg via INTRAVENOUS
  Filled 2016-03-06: qty 100

## 2016-03-06 MED ORDER — THROMBIN 5000 UNITS EX SOLR
OROMUCOSAL | Status: DC | PRN
  Administered 2016-03-06: 10 mL via TOPICAL

## 2016-03-06 MED ORDER — HYDROMORPHONE HCL 1 MG/ML IJ SOLN: 0.5000 mg | INTRAMUSCULAR | Status: DC | PRN

## 2016-03-06 MED ORDER — CEFAZOLIN SODIUM 1-5 GM-% IV SOLN
1.0000 g | Freq: Three times a day (TID) | INTRAVENOUS | Status: AC
  Administered 2016-03-06 – 2016-03-07 (×2): 1 g via INTRAVENOUS
  Filled 2016-03-06 (×2): qty 50

## 2016-03-06 MED ORDER — OXYCODONE-ACETAMINOPHEN 5-325 MG PO TABS
ORAL_TABLET | ORAL | Status: AC
  Filled 2016-03-06: qty 2

## 2016-03-06 MED ORDER — CYCLOBENZAPRINE HCL 10 MG PO TABS
10.0000 mg | ORAL_TABLET | Freq: Three times a day (TID) | ORAL | Status: DC | PRN
  Administered 2016-03-06 (×2): 10 mg via ORAL
  Filled 2016-03-06: qty 1

## 2016-03-06 MED ORDER — LACTATED RINGERS IV SOLN
INTRAVENOUS | Status: DC
  Administered 2016-03-06 (×3): via INTRAVENOUS

## 2016-03-06 MED ORDER — 0.9 % SODIUM CHLORIDE (POUR BTL) OPTIME
TOPICAL | Status: DC | PRN
  Administered 2016-03-06: 1000 mL

## 2016-03-06 MED ORDER — SODIUM CHLORIDE 0.9% FLUSH
3.0000 mL | Freq: Two times a day (BID) | INTRAVENOUS | Status: DC
  Administered 2016-03-06: 3 mL via INTRAVENOUS

## 2016-03-06 MED ORDER — PHENYLEPHRINE HCL 10 MG/ML IJ SOLN
INTRAMUSCULAR | Status: DC | PRN
  Administered 2016-03-06: 80 ug via INTRAVENOUS

## 2016-03-06 MED ORDER — TRAMADOL HCL 50 MG PO TABS: 50.0000 mg | ORAL_TABLET | Freq: Three times a day (TID) | ORAL | Status: DC | PRN

## 2016-03-06 MED ORDER — ACETAMINOPHEN 650 MG RE SUPP: 650.0000 mg | RECTAL | Status: DC | PRN

## 2016-03-06 MED ORDER — SODIUM CHLORIDE 0.9 % IR SOLN
Status: DC | PRN
  Administered 2016-03-06: 500 mL

## 2016-03-06 MED ORDER — HEMOSTATIC AGENTS (NO CHARGE) OPTIME
TOPICAL | Status: DC | PRN
  Administered 2016-03-06: 1 via TOPICAL

## 2016-03-06 MED ORDER — HYDROCODONE-ACETAMINOPHEN 5-325 MG PO TABS: 1.0000 | ORAL_TABLET | ORAL | Status: DC | PRN

## 2016-03-06 MED ORDER — SUGAMMADEX SODIUM 200 MG/2ML IV SOLN
INTRAVENOUS | Status: AC
  Filled 2016-03-06: qty 2

## 2016-03-06 MED ORDER — MIDAZOLAM HCL 2 MG/2ML IJ SOLN
INTRAMUSCULAR | Status: AC
  Filled 2016-03-06: qty 2

## 2016-03-06 MED ORDER — CYCLOBENZAPRINE HCL 10 MG PO TABS
ORAL_TABLET | ORAL | Status: AC
  Filled 2016-03-06: qty 1

## 2016-03-06 MED ORDER — ROCURONIUM BROMIDE 100 MG/10ML IV SOLN
INTRAVENOUS | Status: DC | PRN
  Administered 2016-03-06: 40 mg via INTRAVENOUS

## 2016-03-06 MED ORDER — PROPOFOL 10 MG/ML IV BOLUS
INTRAVENOUS | Status: AC
  Filled 2016-03-06: qty 20

## 2016-03-06 MED ORDER — ACETAMINOPHEN 325 MG PO TABS: 650.0000 mg | ORAL_TABLET | ORAL | Status: DC | PRN

## 2016-03-06 MED ORDER — ROCURONIUM BROMIDE 50 MG/5ML IV SOLN
INTRAVENOUS | Status: AC
  Filled 2016-03-06: qty 1

## 2016-03-06 MED ORDER — MIDAZOLAM HCL 5 MG/5ML IJ SOLN
INTRAMUSCULAR | Status: DC | PRN
  Administered 2016-03-06: 1 mg via INTRAVENOUS

## 2016-03-06 MED ORDER — METOPROLOL SUCCINATE ER 100 MG PO TB24
300.0000 mg | ORAL_TABLET | Freq: Every day | ORAL | Status: DC
  Filled 2016-03-06: qty 3

## 2016-03-06 MED ORDER — ONDANSETRON HCL 4 MG/2ML IJ SOLN
INTRAMUSCULAR | Status: AC
  Filled 2016-03-06: qty 2

## 2016-03-06 MED ORDER — DEXAMETHASONE SODIUM PHOSPHATE 10 MG/ML IJ SOLN
10.0000 mg | INTRAMUSCULAR | Status: AC
  Administered 2016-03-06: 10 mg via INTRAVENOUS
  Filled 2016-03-06: qty 1

## 2016-03-06 MED ORDER — LIDOCAINE HCL (CARDIAC) 20 MG/ML IV SOLN
INTRAVENOUS | Status: DC | PRN
  Administered 2016-03-06: 100 mg via INTRAVENOUS

## 2016-03-06 MED ORDER — ONDANSETRON HCL 4 MG/2ML IJ SOLN: 4.0000 mg | INTRAMUSCULAR | Status: DC | PRN

## 2016-03-06 MED ORDER — ZOLPIDEM TARTRATE 5 MG PO TABS: 5.0000 mg | ORAL_TABLET | Freq: Every evening | ORAL | Status: DC | PRN

## 2016-03-06 MED ORDER — THROMBIN 5000 UNITS EX SOLR
CUTANEOUS | Status: DC | PRN
  Administered 2016-03-06 (×2): 5000 [IU] via TOPICAL

## 2016-03-06 MED ORDER — MUPIROCIN 2 % EX OINT
TOPICAL_OINTMENT | CUTANEOUS | Status: AC
  Filled 2016-03-06: qty 22

## 2016-03-06 MED ORDER — FENTANYL CITRATE (PF) 100 MCG/2ML IJ SOLN
INTRAMUSCULAR | Status: DC | PRN
  Administered 2016-03-06: 50 ug via INTRAVENOUS
  Administered 2016-03-06 (×2): 100 ug via INTRAVENOUS

## 2016-03-06 MED ORDER — VITAMIN D (ERGOCALCIFEROL) 1.25 MG (50000 UNIT) PO CAPS: 50000.0000 [IU] | ORAL_CAPSULE | ORAL | Status: DC

## 2016-03-06 MED ORDER — PRAVASTATIN SODIUM 40 MG PO TABS
20.0000 mg | ORAL_TABLET | Freq: Every day | ORAL | Status: DC
  Administered 2016-03-06: 20 mg via ORAL
  Filled 2016-03-06: qty 1

## 2016-03-06 MED ORDER — LIDOCAINE HCL (CARDIAC) 20 MG/ML IV SOLN
INTRAVENOUS | Status: AC
  Filled 2016-03-06: qty 5

## 2016-03-06 MED ORDER — SUGAMMADEX SODIUM 200 MG/2ML IV SOLN
INTRAVENOUS | Status: DC | PRN
  Administered 2016-03-06: 150 mg via INTRAVENOUS

## 2016-03-06 MED ORDER — ONDANSETRON HCL 4 MG/2ML IJ SOLN
INTRAMUSCULAR | Status: DC | PRN
  Administered 2016-03-06: 4 mg via INTRAVENOUS

## 2016-03-06 MED ORDER — FENTANYL CITRATE (PF) 100 MCG/2ML IJ SOLN
25.0000 ug | INTRAMUSCULAR | Status: DC | PRN
  Administered 2016-03-06 (×2): 50 ug via INTRAVENOUS
  Administered 2016-03-06: 25 ug via INTRAVENOUS

## 2016-03-06 MED ORDER — PANTOPRAZOLE SODIUM 40 MG PO TBEC
40.0000 mg | DELAYED_RELEASE_TABLET | Freq: Every day | ORAL | Status: DC
  Administered 2016-03-06: 40 mg via ORAL
  Filled 2016-03-06: qty 1

## 2016-03-06 MED ORDER — PHENOL 1.4 % MT LIQD: 1.0000 | OROMUCOSAL | Status: DC | PRN

## 2016-03-06 MED ORDER — ASPIRIN EC 81 MG PO TBEC
81.0000 mg | DELAYED_RELEASE_TABLET | Freq: Every day | ORAL | Status: DC
  Administered 2016-03-06: 81 mg via ORAL
  Filled 2016-03-06: qty 1

## 2016-03-06 MED ORDER — GEMFIBROZIL 600 MG PO TABS
600.0000 mg | ORAL_TABLET | Freq: Two times a day (BID) | ORAL | Status: DC
  Administered 2016-03-06: 600 mg via ORAL
  Filled 2016-03-06 (×2): qty 1

## 2016-03-06 SURGICAL SUPPLY — 54 items
BAG DECANTER FOR FLEXI CONT (MISCELLANEOUS) ×2 IMPLANT
BENZOIN TINCTURE PRP APPL 2/3 (GAUZE/BANDAGES/DRESSINGS) ×2 IMPLANT
BIT DRILL 13 (BIT) ×2 IMPLANT
BRUSH SCRUB EZ PLAIN DRY (MISCELLANEOUS) ×2 IMPLANT
BUR MATCHSTICK NEURO 3.0 LAGG (BURR) ×2 IMPLANT
CAGE PEEK 6X14X11 (Cage) ×2 IMPLANT
CANISTER SUCT 3000ML PPV (MISCELLANEOUS) ×2 IMPLANT
DRAPE C-ARM 42X72 X-RAY (DRAPES) ×4 IMPLANT
DRAPE LAPAROTOMY 100X72 PEDS (DRAPES) ×2 IMPLANT
DRAPE MICROSCOPE LEICA (MISCELLANEOUS) ×2 IMPLANT
DRAPE POUCH INSTRU U-SHP 10X18 (DRAPES) ×2 IMPLANT
DRSG OPSITE POSTOP 4X6 (GAUZE/BANDAGES/DRESSINGS) ×2 IMPLANT
DURAPREP 6ML APPLICATOR 50/CS (WOUND CARE) ×2 IMPLANT
ELECT COATED BLADE 2.86 ST (ELECTRODE) ×2 IMPLANT
ELECT REM PT RETURN 9FT ADLT (ELECTROSURGICAL) ×2
ELECTRODE REM PT RTRN 9FT ADLT (ELECTROSURGICAL) ×1 IMPLANT
GAUZE SPONGE 4X4 12PLY STRL (GAUZE/BANDAGES/DRESSINGS) ×2 IMPLANT
GAUZE SPONGE 4X4 16PLY XRAY LF (GAUZE/BANDAGES/DRESSINGS) IMPLANT
GLOVE ECLIPSE 9.0 STRL (GLOVE) ×2 IMPLANT
GLOVE EXAM NITRILE LRG STRL (GLOVE) IMPLANT
GLOVE EXAM NITRILE MD LF STRL (GLOVE) IMPLANT
GLOVE EXAM NITRILE XL STR (GLOVE) IMPLANT
GLOVE EXAM NITRILE XS STR PU (GLOVE) IMPLANT
GOWN STRL REUS W/ TWL LRG LVL3 (GOWN DISPOSABLE) IMPLANT
GOWN STRL REUS W/ TWL XL LVL3 (GOWN DISPOSABLE) ×1 IMPLANT
GOWN STRL REUS W/TWL 2XL LVL3 (GOWN DISPOSABLE) IMPLANT
GOWN STRL REUS W/TWL LRG LVL3 (GOWN DISPOSABLE)
GOWN STRL REUS W/TWL XL LVL3 (GOWN DISPOSABLE) ×1
HALTER HD/CHIN CERV TRACTION D (MISCELLANEOUS) ×2 IMPLANT
HEMOSTAT POWDER KIT SURGIFOAM (HEMOSTASIS) ×2 IMPLANT
HEMOSTAT SURGICEL 2X14 (HEMOSTASIS) IMPLANT
KIT BASIN OR (CUSTOM PROCEDURE TRAY) ×2 IMPLANT
KIT ROOM TURNOVER OR (KITS) ×2 IMPLANT
NEEDLE SPNL 20GX3.5 QUINCKE YW (NEEDLE) ×2 IMPLANT
NS IRRIG 1000ML POUR BTL (IV SOLUTION) ×2 IMPLANT
PACK LAMINECTOMY NEURO (CUSTOM PROCEDURE TRAY) ×2 IMPLANT
PAD ARMBOARD 7.5X6 YLW CONV (MISCELLANEOUS) ×6 IMPLANT
PLATE VISION ELITE 40MM (Plate) ×2 IMPLANT
RUBBERBAND STERILE (MISCELLANEOUS) ×4 IMPLANT
SCREW ST 13X4XST VA NS SPNE (Screw) ×6 IMPLANT
SCREW ST VAR 4 ATL (Screw) ×6 IMPLANT
SPACER SPNL 11X14X6XPEEK CVD (Cage) ×2 IMPLANT
SPCR SPNL 11X14X6XPEEK CVD (Cage) ×2 IMPLANT
SPONGE INTESTINAL PEANUT (DISPOSABLE) ×2 IMPLANT
SPONGE SURGIFOAM ABS GEL SZ50 (HEMOSTASIS) ×2 IMPLANT
STRIP CLOSURE SKIN 1/2X4 (GAUZE/BANDAGES/DRESSINGS) ×2 IMPLANT
SUT VIC AB 3-0 SH 8-18 (SUTURE) ×2 IMPLANT
SUT VIC AB 4-0 RB1 18 (SUTURE) ×2 IMPLANT
TAPE CLOTH 4X10 WHT NS (GAUZE/BANDAGES/DRESSINGS) ×2 IMPLANT
TAPE STRIPS DRAPE STRL (GAUZE/BANDAGES/DRESSINGS) ×2 IMPLANT
TOWEL OR 17X24 6PK STRL BLUE (TOWEL DISPOSABLE) ×2 IMPLANT
TOWEL OR 17X26 10 PK STRL BLUE (TOWEL DISPOSABLE) ×2 IMPLANT
TRAP SPECIMEN MUCOUS 40CC (MISCELLANEOUS) ×2 IMPLANT
WATER STERILE IRR 1000ML POUR (IV SOLUTION) ×2 IMPLANT

## 2016-03-06 NOTE — Op Note (Signed)
Date of procedure: 03/06/2016  Date of dictation: Same  Service: Neurosurgery  Preoperative diagnosis: C5-6, C6-7 spondylosis with stenosis and radiculopathy  Postoperative diagnosis: Same  Procedure Name: C5-6, C6-7 anterior cervical discectomy with interbody fusion utilizing interbody peek cages, locally harvested autograft, and anterior plate instrumentation.  Surgeon:Cadee Agro A.Massiah Longanecker, M.D.  Asst. Surgeon: Vertell Limber  Anesthesia: General  Indication: 75 year old female with neck and bilateral upper extremity symptoms. Workup demonstrates evidence of significant spondylosis and stenosis at C5-6 and C6-7. Patient presents now for total anterior cervical decompression and fusion in hopes of improving her symptoms.  Operative note: After induction of anesthesia, patient positioned supine with neck slightly extended and held in place of halter traction. Anterior cervical region prepped and draped sterilely. Incision made overlying C6. Dissection performed down to the platysma. Platysma divided vertically. Dissection proceeds along the medial border of the sternocleidomastoid muscle and carotid sheath. Trachea and esophagus mobilized and retracted towards the left. Prevertebral fascia stripped off the anterior spinal: Per longus colli muscles elevated bilaterally. Self retaining retractor placed. Fluoroscopy used. Levels confirm. Disc spaces incised and mid discectomies performed with various instruments to remove all elements the disc down to the level of the posterior annulus. Microscope [place] used throughout the remainder of the discectomy. Remaining aspects of annulus and osteophytes removed using a high-speed drill down to the level of the posterior longitudinal ligament. Posterior longitudinal ligament was then elevated and resected in a piecemeal fashion. Underlying thecal sac was then identified. A wide central decompression was then performed by undercutting the bodies of C5 and C6.  Decompression then proceeded into the neural foramina. Wide anterior foraminotomies were then performed bilaterally along the course of the C6 nerve root. At this point and very thorough decompression had been achieved. There was no evidence of injury to the thecal sac or nerve roots. Procedures then repeated at C6-7 again without complication. Wound was then irrigated and isolation and Surgifoam was used for hemostasis. 6 mm Medtronic anatomic peek cages packed with locally harvested autograft were then packed into place and recessed slightly from the anterior cortical margin. 40 mm Atlantis anterior cervical plate was then placed over the C5, C6 and C7 levels. This is an attached under fluoroscopic guidance using 13 mm variable-angle screws to each at both levels. All 6 screws were then final tightening and found to be solidly within the bone. Locking screws were engaged and L3 levels. Final images revealed good position of bone grafts and hardware at proper operative level with normal alignment of the spine. Wounds. One final time. Hemostasis was assured. Wound is then closed in typical fashion. Steri-Strips and sterile dressing applied. No apparent complications. Patient tolerated the procedure well and she returns to the recovery room postoperatively

## 2016-03-06 NOTE — Brief Op Note (Signed)
03/06/2016  1:15 PM  PATIENT:  Ventura Sellers  75 y.o. female  PRE-OPERATIVE DIAGNOSIS:  Spondylosis  POST-OPERATIVE DIAGNOSIS:  Spondylosis  PROCEDURE:  Procedure(s) with comments: ACDF - C5-C6 - C6-C7   (N/A) - ACDF - C5-C6 - C6-C7    SURGEON:  Surgeon(s) and Role:    * Earnie Larsson, MD - Primary    * Erline Levine, MD - Assisting  PHYSICIAN ASSISTANT:   ASSISTANTS:    ANESTHESIA:   general  EBL:  Total I/O In: 1000 [I.V.:1000] Out: 200 [Blood:200]  BLOOD ADMINISTERED:none  DRAINS: none   LOCAL MEDICATIONS USED:  NONE  SPECIMEN:  No Specimen  DISPOSITION OF SPECIMEN:  N/A  COUNTS:  YES  TOURNIQUET:  * No tourniquets in log *  DICTATION: .Dragon Dictation  PLAN OF CARE: Admit to inpatient   PATIENT DISPOSITION:  PACU - hemodynamically stable.   Delay start of Pharmacological VTE agent (>24hrs) due to surgical blood loss or risk of bleeding: yes

## 2016-03-06 NOTE — Transfer of Care (Signed)
Immediate Anesthesia Transfer of Care Note  Patient: Pamela Schwartz  Procedure(s) Performed: Procedure(s) with comments: ACDF - C5-C6 - C6-C7   (N/A) - ACDF - C5-C6 - C6-C7    Patient Location: PACU  Anesthesia Type:General  Level of Consciousness: awake, alert  and oriented  Airway & Oxygen Therapy: Patient Spontanous Breathing and Patient connected to nasal cannula oxygen  Post-op Assessment: Report given to RN and Post -op Vital signs reviewed and stable  Post vital signs: Reviewed and stable  Last Vitals:  Filed Vitals:   03/06/16 0923  BP: 147/76  Pulse: 65  Temp: 36.7 C  Resp: 20    Complications: No apparent anesthesia complications

## 2016-03-06 NOTE — Anesthesia Procedure Notes (Signed)
Procedure Name: Intubation Date/Time: 03/06/2016 11:32 AM Performed by: Clearnce Sorrel Pre-anesthesia Checklist: Patient identified, Timeout performed, Emergency Drugs available, Suction available and Patient being monitored Patient Re-evaluated:Patient Re-evaluated prior to inductionOxygen Delivery Method: Circle system utilized Preoxygenation: Pre-oxygenation with 100% oxygen Intubation Type: IV induction Ventilation: Mask ventilation without difficulty Laryngoscope Size: Mac and 3 Grade View: Grade I Tube type: Oral Tube size: 7.0 mm Number of attempts: 1 Airway Equipment and Method: Stylet Placement Confirmation: ETT inserted through vocal cords under direct vision,  breath sounds checked- equal and bilateral and positive ETCO2 Secured at: 22 cm Tube secured with: Tape Dental Injury: Teeth and Oropharynx as per pre-operative assessment

## 2016-03-06 NOTE — Anesthesia Preprocedure Evaluation (Addendum)
Anesthesia Evaluation  Patient identified by MRN, date of birth, ID band Patient awake    Reviewed: Allergy & Precautions, NPO status , Patient's Chart, lab work & pertinent test results  History of Anesthesia Complications Negative for: history of anesthetic complications  Airway Mallampati: II  TM Distance: >3 FB Neck ROM: Full    Dental no notable dental hx. (+) Dental Advisory Given   Pulmonary former smoker,    Pulmonary exam normal breath sounds clear to auscultation       Cardiovascular Exercise Tolerance: Good hypertension, Pt. on medications and Pt. on home beta blockers + CAD  Normal cardiovascular exam Rhythm:Regular Rate:Normal     Neuro/Psych negative neurological ROS  negative psych ROS   GI/Hepatic Neg liver ROS, GERD  Medicated and Controlled,  Endo/Other  negative endocrine ROS  Renal/GU negative Renal ROS  negative genitourinary   Musculoskeletal  (+) Arthritis ,   Abdominal   Peds negative pediatric ROS (+)  Hematology negative hematology ROS (+)   Anesthesia Other Findings   Reproductive/Obstetrics negative OB ROS                            Anesthesia Physical Anesthesia Plan  ASA: II  Anesthesia Plan: General   Post-op Pain Management:    Induction: Intravenous  Airway Management Planned: Oral ETT  Additional Equipment:   Intra-op Plan:   Post-operative Plan: Extubation in OR  Informed Consent: I have reviewed the patients History and Physical, chart, labs and discussed the procedure including the risks, benefits and alternatives for the proposed anesthesia with the patient or authorized representative who has indicated his/her understanding and acceptance.   Dental advisory given  Plan Discussed with: CRNA  Anesthesia Plan Comments:         Anesthesia Quick Evaluation

## 2016-03-06 NOTE — Anesthesia Postprocedure Evaluation (Signed)
Anesthesia Post Note  Patient: Pamela Schwartz  Procedure(s) Performed: Procedure(s) (LRB): ACDF - C5-C6 - C6-C7   (N/A)  Patient location during evaluation: PACU Anesthesia Type: General Level of consciousness: awake and alert Pain management: pain level controlled Vital Signs Assessment: post-procedure vital signs reviewed and stable Respiratory status: spontaneous breathing, nonlabored ventilation, respiratory function stable and patient connected to nasal cannula oxygen Cardiovascular status: blood pressure returned to baseline and stable Postop Assessment: no signs of nausea or vomiting Anesthetic complications: no    Last Vitals:  Filed Vitals:   03/06/16 1359 03/06/16 1454  BP:  170/71  Pulse: 66 66  Temp:  36.4 C  Resp: 17 18    Last Pain:  Filed Vitals:   03/06/16 1546  PainSc: 3                  Cam Dauphin JENNETTE

## 2016-03-06 NOTE — Progress Notes (Signed)
Orthopedic Tech Progress Note Patient Details:  [name] 03/28/1941 [id number] Patient already has c-collar. Patient ID: Pamela Schwartz, female   DOB: [birth year], 75 y.o.   MRN: [id number]   Braulio Bosch 03/06/2016, 3:12 PM

## 2016-03-06 NOTE — H&P (Signed)
Pamela Schwartz is an 75 y.o. female.   Chief Complaint: Neck and bilateral arm pain and weakness HPI: 70 female with progressive neck pain with associated bilateral upper extremity numbness, paresthesias and weakness failing conservative management. Workup demonstrates evidence of marked spondylosis with stenosis at C5-6 and C6-7. Patient presents now for two-level anterior cervical decompression and fusion in hopes of improving her symptoms.  Past Medical History  Diagnosis Date  . Hearing loss   . Arthritis   . Hypertension   . Chronic kidney disease   . Scars   . IBS (irritable bowel syndrome)   . Cancer [facility])     denies  . GERD (gastroesophageal reflux disease)   . Hypercholesterolemia     Past Surgical History  Procedure Laterality Date  . Appendectomy    . Cesarean section    . Bunionectomy      right and left  . Cardiac catheterization      had in [place] around 2012  . Tubal ligation    . Colonoscopy w/ biopsies and polypectomy    . Multiple tooth extractions    . Cataract extraction w/ intraocular lens  implant, bilateral      Family History  Problem Relation Age of Onset  . Hypertension Sister   . Hypertension Brother   . Renal Disease Brother   . Hypertension Sister   . Hypertension Sister    Social History:  reports that she quit smoking about 5 months ago. Her smoking use included Cigarettes. She smoked 0.50 packs per day. She has never used smokeless tobacco. She reports that she does not drink alcohol or use illicit drugs.  Allergies:  Allergies  Allergen Reactions  . Nickel   . Tape     adhesive    Medications Prior to Admission  Medication Sig Dispense Refill  . amLODipine (NORVASC) 10 MG tablet Take 10 mg by mouth daily.  1  . aspirin 81 MG tablet Take 81 mg by mouth daily.    Marland Kitchen dicyclomine (BENTYL) 20 MG tablet Take 20 mg by mouth 4 (four) times daily -  before meals and at bedtime.  1  . gemfibrozil (LOPID) 600 MG tablet Take 600 mg by  mouth 2 (two) times daily before a meal.    . metoprolol (TOPROL-XL) 200 MG 24 hr tablet Take 300 mg by mouth daily.    . pantoprazole (PROTONIX) 40 MG tablet Take 40 mg by mouth daily.  1  . pravastatin (PRAVACHOL) 20 MG tablet Take 20 mg by mouth at bedtime.  1  . traMADol (ULTRAM) 50 MG tablet Take 50 mg by mouth every 8 (eight) hours as needed for moderate pain.   2  . Vitamin D, Ergocalciferol, (DRISDOL) 50000 units CAPS capsule Take 50,000 Units by mouth 2 (two) times daily. Monday and Thursday  1  . zolpidem (AMBIEN CR) 12.5 MG CR tablet Take 12.5 mg by mouth at bedtime.  1  . Multiple Vitamins-Minerals (PRESERVISION AREDS PO) Take 1 tablet by mouth 2 (two) times daily.      Results for orders placed or performed during the hospital encounter of 03/06/16 (from the past 48 hour(s))  Basic metabolic panel     Status: Abnormal   Collection Time: 03/06/16  9:30 AM  Result Value Ref Range   Sodium 141 135 - 145 mmol/L   Potassium 4.4 3.5 - 5.1 mmol/L   Chloride 106 101 - 111 mmol/L   CO2 23 22 - 32 mmol/L   Glucose, Bld  106 (H) 65 - 99 mg/dL   BUN 25 (H) 6 - 20 mg/dL   Creatinine, Ser 1.56 (H) 0.44 - 1.00 mg/dL   Calcium 9.9 8.9 - 10.3 mg/dL   GFR calc non Af Amer 32 (L) >60 mL/min   GFR calc Af Amer 37 (L) >60 mL/min    Comment: (NOTE) The eGFR has been calculated using the CKD EPI equation. This calculation has not been validated in all clinical situations. eGFR's persistently <60 mL/min signify possible Chronic Kidney Disease.    Anion gap 12 5 - 15  CBC WITH DIFFERENTIAL     Status: None   Collection Time: 03/06/16  9:30 AM  Result Value Ref Range   WBC 8.0 4.0 - 10.5 K/uL   RBC 4.55 3.87 - 5.11 MIL/uL   Hemoglobin 13.3 12.0 - 15.0 g/dL   HCT 42.1 36.0 - 46.0 %   MCV 92.5 78.0 - 100.0 fL   MCH 29.2 26.0 - 34.0 pg   MCHC 31.6 30.0 - 36.0 g/dL   RDW 12.7 11.5 - 15.5 %   Platelets 204 150 - 400 K/uL   Neutrophils Relative % 58 %   Neutro Abs 4.6 1.7 - 7.7 K/uL    Lymphocytes Relative 28 %   Lymphs Abs 2.2 0.7 - 4.0 K/uL   Monocytes Relative 8 %   Monocytes Absolute 0.6 0.1 - 1.0 K/uL   Eosinophils Relative 6 %   Eosinophils Absolute 0.5 0.0 - 0.7 K/uL   Basophils Relative 0 %   Basophils Absolute 0.0 0.0 - 0.1 K/uL   No results found.    Blood pressure 147/76, pulse 65, temperature 98 F (36.7 C), resp. rate 20, height '5\' 5"'$  (1.651 m), weight 68.04 kg (150 lb), SpO2 100 %.  The patient is awake and alert. She is oriented and appropriate. Her speech is fluent. Her judgment and insight are intact. Her cranial nerve function is intact bilaterally. Motor and sensory function of the extremities reveal some mild distal weakness and loss of fine motor control in both hands. She has decreased sensation pinprick and light touch and her C6-C7 and C8 dermatomes bilaterally. Reflexes are brisk. No evidence of long tract signs. Gait reasonably normal. Posture normal. Examination of the head ears eyes nose and throat is unremarkable. Chest and abdomen are benign. Extremities are free from injury or deformity. Neck is supple with normal airway and normal pulses. Assessment/Plan  C5-6, C6-7 spondylosis with stenosis. Plan C5-6, C6-7 anterior cervical discectomy with interbody fusion utilizing interbody cages and locally harvested autograft augmented with anterior plate instrumentation. Risks and benefits explained. Patient wishes to proceed.  Param Capri A 03/06/2016, 10:56 AM

## 2016-03-07 MED ORDER — HYDROCODONE-ACETAMINOPHEN 5-325 MG PO TABS: 1.0000 | ORAL_TABLET | ORAL | Status: DC | PRN

## 2016-03-07 MED ORDER — CYCLOBENZAPRINE HCL 10 MG PO TABS: 10.0000 mg | ORAL_TABLET | Freq: Three times a day (TID) | ORAL | Status: DC | PRN

## 2016-03-07 NOTE — Discharge Summary (Signed)
Physician Discharge Summary  Patient ID: Pamela Schwartz MRN: [id number] DOB/AGE: May 18, 1941 75 y.o.  Admit date: 03/06/2016 Discharge date: 03/07/2016  Admission Diagnoses:  Discharge Diagnoses:  Active Problems:   Cervical spinal stenosis   Discharged Condition: good  Hospital Course: Patient admitted to the hospital where she underwent an uncomplicated two-level anterior cervical decompression infusion. Postoperative she is doing quite well peer preoperative neck and upper extremity pain improved. Pain well controlled. Swallowing well. Voice strong. Ready for discharge home.  Consults:   Significant Diagnostic Studies:   Treatments:   Discharge Exam: Blood pressure 142/64, pulse 66, temperature 98.2 F (36.8 C), temperature source Oral, resp. rate 18, height 5\' 5"  (1.651 m), weight 68.04 kg (150 lb), SpO2 97 %. Awake and alert. Oriented and appropriate. Speech fluent. Judgment and insight intact. Motor and sensory function extremities normal. Wound clean and dry. Chest and abdomen benign.  Disposition: Final discharge disposition not confirmed     Medication List    TAKE these medications        amLODipine 10 MG tablet  Commonly known as:  NORVASC  Take 10 mg by mouth daily.     aspirin 81 MG tablet  Take 81 mg by mouth daily.     cyclobenzaprine 10 MG tablet  Commonly known as:  FLEXERIL  Take 1 tablet (10 mg total) by mouth 3 (three) times daily as needed for muscle spasms.     dicyclomine 20 MG tablet  Commonly known as:  BENTYL  Take 20 mg by mouth 4 (four) times daily -  before meals and at bedtime.     gemfibrozil 600 MG tablet  Commonly known as:  LOPID  Take 600 mg by mouth 2 (two) times daily before a meal.     HYDROcodone-acetaminophen 5-325 MG tablet  Commonly known as:  NORCO/VICODIN  Take 1-2 tablets by mouth every 4 (four) hours as needed (mild pain).     metoprolol 200 MG 24 hr tablet  Commonly known as:  TOPROL-XL  Take 300 mg by mouth  daily.     pantoprazole 40 MG tablet  Commonly known as:  PROTONIX  Take 40 mg by mouth daily.     pravastatin 20 MG tablet  Commonly known as:  PRAVACHOL  Take 20 mg by mouth at bedtime.     PRESERVISION AREDS PO  Take 1 tablet by mouth 2 (two) times daily.     traMADol 50 MG tablet  Commonly known as:  ULTRAM  Take 50 mg by mouth every 8 (eight) hours as needed for moderate pain.     Vitamin D (Ergocalciferol) 50000 units Caps capsule  Commonly known as:  DRISDOL  Take 50,000 Units by mouth 2 (two) times daily. Monday and Thursday     zolpidem 12.5 MG CR tablet  Commonly known as:  AMBIEN CR  Take 12.5 mg by mouth at bedtime.           Follow-up Information    Follow up with Charlie Pitter, MD.   Specialty:  Neurosurgery   Contact information:   [address] [place] [address] [phone]       Signed: Charlie Pitter 03/07/2016, 10:05 AM

## 2016-03-07 NOTE — Discharge Instructions (Signed)

## 2016-03-07 NOTE — Progress Notes (Signed)
Pt doing well. Pt and husband given D/C instructions with Rx's, verbal understanding was provided. Pt's incision is clean and dry with no sign of infection. Pt's IV was removed prior to D/C. Pt D/C'd home via wheelchair @ 1100 per MD order. Pt is stable @ D/C and has no other needs at this time. Holli Humbles, RN

## 2016-03-08 ENCOUNTER — Encounter (HOSPITAL_COMMUNITY): Payer: Self-pay | Admitting: Neurosurgery

## 2016-04-05 DIAGNOSIS — K210 Gastro-esophageal reflux disease with esophagitis: Secondary | ICD-10-CM | POA: Diagnosis not present

## 2016-04-05 DIAGNOSIS — E782 Mixed hyperlipidemia: Secondary | ICD-10-CM | POA: Diagnosis not present

## 2016-04-05 DIAGNOSIS — R7301 Impaired fasting glucose: Secondary | ICD-10-CM | POA: Diagnosis not present

## 2016-04-05 DIAGNOSIS — M5412 Radiculopathy, cervical region: Secondary | ICD-10-CM | POA: Diagnosis not present

## 2016-04-05 DIAGNOSIS — I10 Essential (primary) hypertension: Secondary | ICD-10-CM | POA: Diagnosis not present

## 2016-04-12 DIAGNOSIS — I10 Essential (primary) hypertension: Secondary | ICD-10-CM | POA: Diagnosis not present

## 2016-04-12 DIAGNOSIS — M47812 Spondylosis without myelopathy or radiculopathy, cervical region: Secondary | ICD-10-CM | POA: Diagnosis not present

## 2016-04-12 DIAGNOSIS — Z6826 Body mass index (BMI) 26.0-26.9, adult: Secondary | ICD-10-CM | POA: Diagnosis not present

## 2016-05-23 DIAGNOSIS — M47812 Spondylosis without myelopathy or radiculopathy, cervical region: Secondary | ICD-10-CM | POA: Diagnosis not present

## 2016-05-23 DIAGNOSIS — Z6826 Body mass index (BMI) 26.0-26.9, adult: Secondary | ICD-10-CM | POA: Diagnosis not present

## 2016-06-21 DIAGNOSIS — Z1231 Encounter for screening mammogram for malignant neoplasm of breast: Secondary | ICD-10-CM | POA: Diagnosis not present

## 2016-06-24 IMAGING — RF DG CERVICAL SPINE 2 OR 3 VIEWS
1 series · 2 of 2 positions shown · non-contrast
Comparison: No prior.

CLINICAL DATA: Cervical spine surgery.

EXAM:
CERVICAL SPINE - 2-3 VIEW; DG C-ARM 61-120 MIN

[Series 1: run · 2 of 2 slices shown]
[im 1/2]
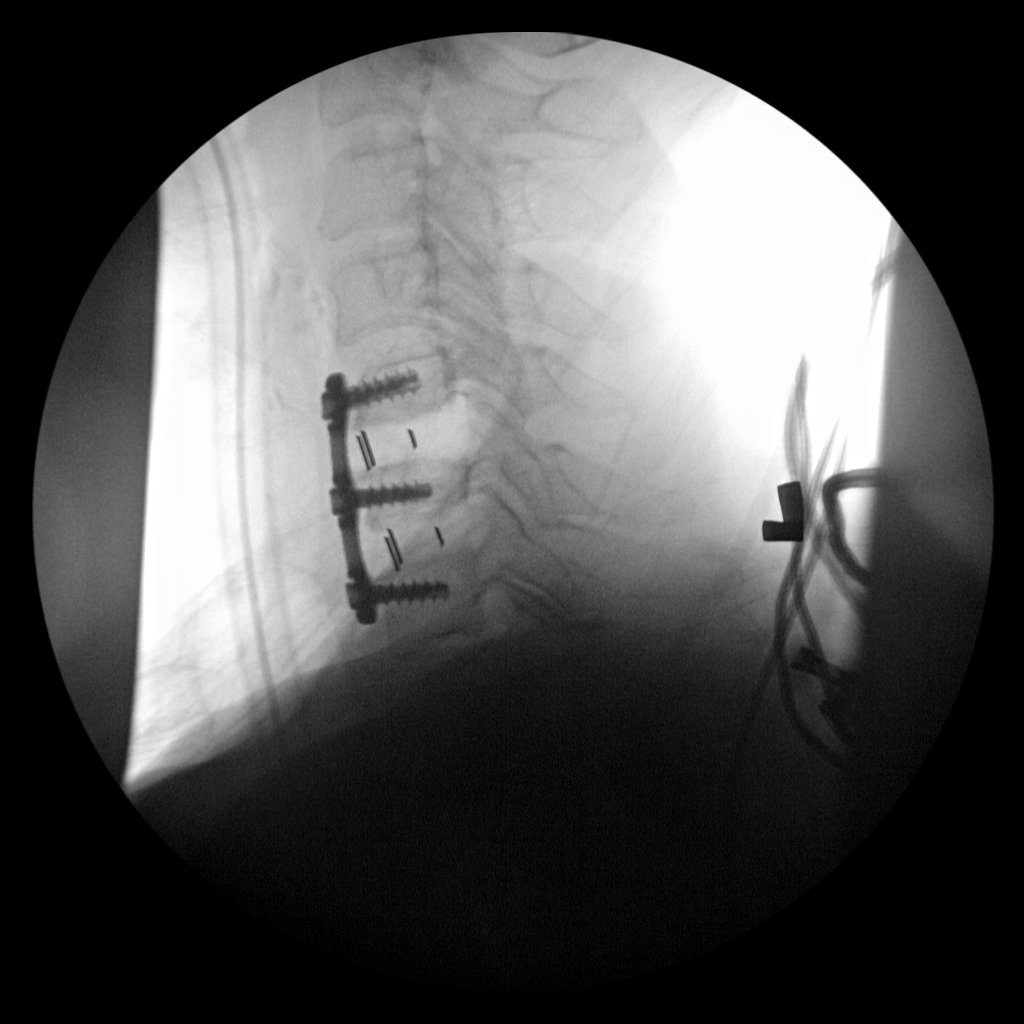
[im 2/2]
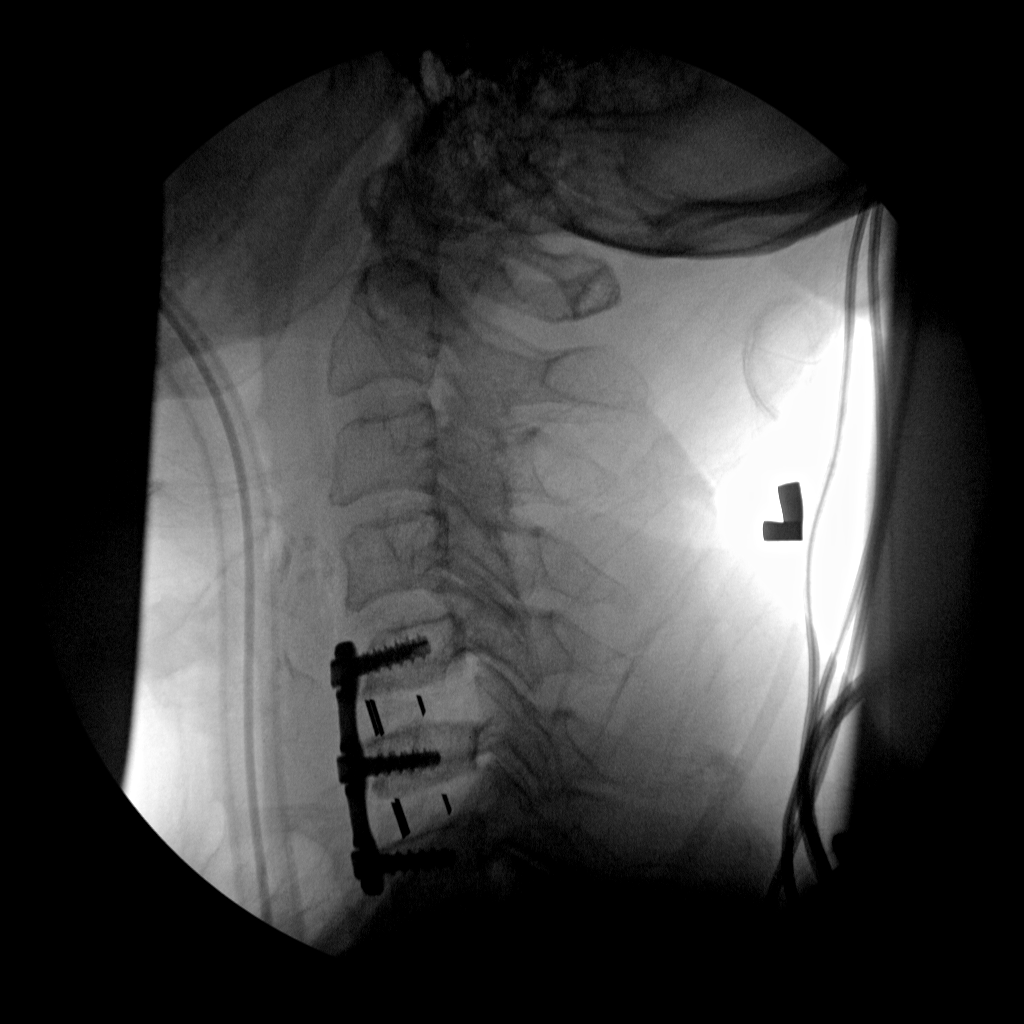

[2 of 2 positions shown; findings below may reference images not displayed]

FINDINGS: C5 through C7 anterior interbody fusion with good anatomic
alignment. Hardware intact. No acute bony abnormality. 0 minutes 5
seconds fluoroscopy. Two images.
IMPRESSION: C5 through C7 anterior and interbody fusion . No acute abnormality
identified.

## 2016-07-26 DIAGNOSIS — K210 Gastro-esophageal reflux disease with esophagitis: Secondary | ICD-10-CM | POA: Diagnosis not present

## 2016-07-26 DIAGNOSIS — E559 Vitamin D deficiency, unspecified: Secondary | ICD-10-CM | POA: Diagnosis not present

## 2016-07-26 DIAGNOSIS — M546 Pain in thoracic spine: Secondary | ICD-10-CM | POA: Diagnosis not present

## 2016-07-26 DIAGNOSIS — E782 Mixed hyperlipidemia: Secondary | ICD-10-CM | POA: Diagnosis not present

## 2016-07-26 DIAGNOSIS — R7301 Impaired fasting glucose: Secondary | ICD-10-CM | POA: Diagnosis not present

## 2016-07-26 DIAGNOSIS — Z23 Encounter for immunization: Secondary | ICD-10-CM | POA: Diagnosis not present

## 2016-07-26 DIAGNOSIS — I10 Essential (primary) hypertension: Secondary | ICD-10-CM | POA: Diagnosis not present

## 2016-08-03 DIAGNOSIS — M546 Pain in thoracic spine: Secondary | ICD-10-CM | POA: Diagnosis not present

## 2016-11-02 DIAGNOSIS — E782 Mixed hyperlipidemia: Secondary | ICD-10-CM | POA: Diagnosis not present

## 2016-11-02 DIAGNOSIS — I10 Essential (primary) hypertension: Secondary | ICD-10-CM | POA: Diagnosis not present

## 2016-11-02 DIAGNOSIS — M5412 Radiculopathy, cervical region: Secondary | ICD-10-CM | POA: Diagnosis not present

## 2016-11-02 DIAGNOSIS — R7301 Impaired fasting glucose: Secondary | ICD-10-CM | POA: Diagnosis not present

## 2016-11-02 DIAGNOSIS — E559 Vitamin D deficiency, unspecified: Secondary | ICD-10-CM | POA: Diagnosis not present

## 2016-11-02 DIAGNOSIS — K210 Gastro-esophageal reflux disease with esophagitis: Secondary | ICD-10-CM | POA: Diagnosis not present

## 2017-02-12 DIAGNOSIS — K219 Gastro-esophageal reflux disease without esophagitis: Secondary | ICD-10-CM | POA: Diagnosis not present

## 2017-02-12 DIAGNOSIS — I10 Essential (primary) hypertension: Secondary | ICD-10-CM | POA: Diagnosis not present

## 2017-02-12 DIAGNOSIS — F17200 Nicotine dependence, unspecified, uncomplicated: Secondary | ICD-10-CM | POA: Diagnosis not present

## 2017-02-12 DIAGNOSIS — E782 Mixed hyperlipidemia: Secondary | ICD-10-CM | POA: Diagnosis not present

## 2017-02-12 DIAGNOSIS — M542 Cervicalgia: Secondary | ICD-10-CM | POA: Diagnosis not present

## 2017-02-12 DIAGNOSIS — R7301 Impaired fasting glucose: Secondary | ICD-10-CM | POA: Diagnosis not present

## 2017-02-12 DIAGNOSIS — I498 Other specified cardiac arrhythmias: Secondary | ICD-10-CM | POA: Diagnosis not present

## 2017-02-27 DIAGNOSIS — H524 Presbyopia: Secondary | ICD-10-CM | POA: Diagnosis not present

## 2017-02-27 DIAGNOSIS — H26493 Other secondary cataract, bilateral: Secondary | ICD-10-CM | POA: Diagnosis not present

## 2017-05-21 DIAGNOSIS — M5432 Sciatica, left side: Secondary | ICD-10-CM | POA: Diagnosis not present

## 2017-05-21 DIAGNOSIS — M542 Cervicalgia: Secondary | ICD-10-CM | POA: Diagnosis not present

## 2017-05-21 DIAGNOSIS — M5431 Sciatica, right side: Secondary | ICD-10-CM | POA: Diagnosis not present

## 2017-05-21 DIAGNOSIS — R7301 Impaired fasting glucose: Secondary | ICD-10-CM | POA: Diagnosis not present

## 2017-05-21 DIAGNOSIS — E782 Mixed hyperlipidemia: Secondary | ICD-10-CM | POA: Diagnosis not present

## 2017-05-21 DIAGNOSIS — I10 Essential (primary) hypertension: Secondary | ICD-10-CM | POA: Diagnosis not present

## 2017-06-07 ENCOUNTER — Other Ambulatory Visit: Payer: Self-pay

## 2017-07-16 DIAGNOSIS — M79674 Pain in right toe(s): Secondary | ICD-10-CM | POA: Diagnosis not present

## 2017-07-16 DIAGNOSIS — M7751 Other enthesopathy of right foot: Secondary | ICD-10-CM | POA: Diagnosis not present

## 2017-07-16 DIAGNOSIS — M898X9 Other specified disorders of bone, unspecified site: Secondary | ICD-10-CM

## 2017-07-16 DIAGNOSIS — M79675 Pain in left toe(s): Secondary | ICD-10-CM | POA: Diagnosis not present

## 2017-07-16 DIAGNOSIS — M7752 Other enthesopathy of left foot: Secondary | ICD-10-CM | POA: Diagnosis not present

## 2017-07-16 HISTORY — DX: Other specified disorders of bone, unspecified site: M89.8X9

## 2017-08-21 DIAGNOSIS — Z23 Encounter for immunization: Secondary | ICD-10-CM | POA: Diagnosis not present

## 2017-08-21 DIAGNOSIS — Z1231 Encounter for screening mammogram for malignant neoplasm of breast: Secondary | ICD-10-CM | POA: Diagnosis not present

## 2017-08-21 DIAGNOSIS — R7301 Impaired fasting glucose: Secondary | ICD-10-CM | POA: Diagnosis not present

## 2017-08-21 DIAGNOSIS — K635 Polyp of colon: Secondary | ICD-10-CM | POA: Diagnosis not present

## 2017-08-21 DIAGNOSIS — I10 Essential (primary) hypertension: Secondary | ICD-10-CM | POA: Diagnosis not present

## 2017-08-21 DIAGNOSIS — K2270 Barrett's esophagus without dysplasia: Secondary | ICD-10-CM | POA: Diagnosis not present

## 2017-08-21 DIAGNOSIS — E782 Mixed hyperlipidemia: Secondary | ICD-10-CM | POA: Diagnosis not present

## 2017-09-27 DIAGNOSIS — K2270 Barrett's esophagus without dysplasia: Secondary | ICD-10-CM | POA: Diagnosis not present

## 2017-09-27 DIAGNOSIS — Z800 Family history of malignant neoplasm of digestive organs: Secondary | ICD-10-CM | POA: Diagnosis not present

## 2017-09-27 DIAGNOSIS — K210 Gastro-esophageal reflux disease with esophagitis: Secondary | ICD-10-CM | POA: Diagnosis not present

## 2017-09-27 DIAGNOSIS — Z86010 Personal history of colonic polyps: Secondary | ICD-10-CM | POA: Diagnosis not present

## 2017-11-29 DIAGNOSIS — I10 Essential (primary) hypertension: Secondary | ICD-10-CM | POA: Diagnosis not present

## 2017-11-29 DIAGNOSIS — E559 Vitamin D deficiency, unspecified: Secondary | ICD-10-CM | POA: Diagnosis not present

## 2017-11-29 DIAGNOSIS — R7301 Impaired fasting glucose: Secondary | ICD-10-CM | POA: Diagnosis not present

## 2017-11-29 DIAGNOSIS — H8110 Benign paroxysmal vertigo, unspecified ear: Secondary | ICD-10-CM | POA: Diagnosis not present

## 2017-11-29 DIAGNOSIS — E782 Mixed hyperlipidemia: Secondary | ICD-10-CM | POA: Diagnosis not present

## 2017-12-11 ENCOUNTER — Encounter: Payer: Self-pay | Admitting: Family Medicine

## 2017-12-11 DIAGNOSIS — I129 Hypertensive chronic kidney disease with stage 1 through stage 4 chronic kidney disease, or unspecified chronic kidney disease: Secondary | ICD-10-CM | POA: Diagnosis not present

## 2017-12-11 DIAGNOSIS — Z800 Family history of malignant neoplasm of digestive organs: Secondary | ICD-10-CM | POA: Diagnosis not present

## 2017-12-11 DIAGNOSIS — K648 Other hemorrhoids: Secondary | ICD-10-CM | POA: Diagnosis not present

## 2017-12-11 DIAGNOSIS — K635 Polyp of colon: Secondary | ICD-10-CM | POA: Diagnosis not present

## 2017-12-11 DIAGNOSIS — N189 Chronic kidney disease, unspecified: Secondary | ICD-10-CM | POA: Diagnosis not present

## 2017-12-11 DIAGNOSIS — K208 Other esophagitis: Secondary | ICD-10-CM | POA: Diagnosis not present

## 2017-12-11 DIAGNOSIS — E559 Vitamin D deficiency, unspecified: Secondary | ICD-10-CM | POA: Diagnosis not present

## 2017-12-11 DIAGNOSIS — K589 Irritable bowel syndrome without diarrhea: Secondary | ICD-10-CM | POA: Diagnosis not present

## 2017-12-11 DIAGNOSIS — Z87891 Personal history of nicotine dependence: Secondary | ICD-10-CM | POA: Diagnosis not present

## 2017-12-11 DIAGNOSIS — E7800 Pure hypercholesterolemia, unspecified: Secondary | ICD-10-CM | POA: Diagnosis not present

## 2017-12-11 DIAGNOSIS — Z7982 Long term (current) use of aspirin: Secondary | ICD-10-CM | POA: Diagnosis not present

## 2017-12-11 DIAGNOSIS — K2970 Gastritis, unspecified, without bleeding: Secondary | ICD-10-CM | POA: Diagnosis not present

## 2017-12-11 DIAGNOSIS — K2900 Acute gastritis without bleeding: Secondary | ICD-10-CM | POA: Diagnosis not present

## 2017-12-11 DIAGNOSIS — Z79899 Other long term (current) drug therapy: Secondary | ICD-10-CM | POA: Diagnosis not present

## 2017-12-11 DIAGNOSIS — Z8719 Personal history of other diseases of the digestive system: Secondary | ICD-10-CM | POA: Diagnosis not present

## 2017-12-11 DIAGNOSIS — K210 Gastro-esophageal reflux disease with esophagitis: Secondary | ICD-10-CM | POA: Diagnosis not present

## 2017-12-11 DIAGNOSIS — K219 Gastro-esophageal reflux disease without esophagitis: Secondary | ICD-10-CM | POA: Diagnosis not present

## 2017-12-11 DIAGNOSIS — K5730 Diverticulosis of large intestine without perforation or abscess without bleeding: Secondary | ICD-10-CM | POA: Diagnosis not present

## 2017-12-11 DIAGNOSIS — D125 Benign neoplasm of sigmoid colon: Secondary | ICD-10-CM | POA: Diagnosis not present

## 2017-12-11 DIAGNOSIS — Z1211 Encounter for screening for malignant neoplasm of colon: Secondary | ICD-10-CM | POA: Diagnosis not present

## 2017-12-11 DIAGNOSIS — Z86010 Personal history of colonic polyps: Secondary | ICD-10-CM | POA: Diagnosis not present

## 2017-12-11 DIAGNOSIS — K449 Diaphragmatic hernia without obstruction or gangrene: Secondary | ICD-10-CM | POA: Diagnosis not present

## 2017-12-11 DIAGNOSIS — M810 Age-related osteoporosis without current pathological fracture: Secondary | ICD-10-CM | POA: Diagnosis not present

## 2018-03-06 DIAGNOSIS — E559 Vitamin D deficiency, unspecified: Secondary | ICD-10-CM | POA: Diagnosis not present

## 2018-03-06 DIAGNOSIS — M7712 Lateral epicondylitis, left elbow: Secondary | ICD-10-CM | POA: Diagnosis not present

## 2018-03-06 DIAGNOSIS — F430 Acute stress reaction: Secondary | ICD-10-CM | POA: Diagnosis not present

## 2018-03-06 DIAGNOSIS — E782 Mixed hyperlipidemia: Secondary | ICD-10-CM | POA: Diagnosis not present

## 2018-03-06 DIAGNOSIS — L718 Other rosacea: Secondary | ICD-10-CM | POA: Diagnosis not present

## 2018-03-06 DIAGNOSIS — I10 Essential (primary) hypertension: Secondary | ICD-10-CM | POA: Diagnosis not present

## 2018-03-06 DIAGNOSIS — R7301 Impaired fasting glucose: Secondary | ICD-10-CM | POA: Diagnosis not present

## 2018-03-10 DIAGNOSIS — H26493 Other secondary cataract, bilateral: Secondary | ICD-10-CM | POA: Diagnosis not present

## 2018-03-10 DIAGNOSIS — H524 Presbyopia: Secondary | ICD-10-CM | POA: Diagnosis not present

## 2018-03-17 DIAGNOSIS — L719 Rosacea, unspecified: Secondary | ICD-10-CM | POA: Diagnosis not present

## 2018-03-17 DIAGNOSIS — L710 Perioral dermatitis: Secondary | ICD-10-CM | POA: Diagnosis not present

## 2018-03-17 DIAGNOSIS — L820 Inflamed seborrheic keratosis: Secondary | ICD-10-CM | POA: Diagnosis not present

## 2018-06-02 DIAGNOSIS — M1852 Other unilateral secondary osteoarthritis of first carpometacarpal joint, left hand: Secondary | ICD-10-CM | POA: Diagnosis not present

## 2018-06-02 DIAGNOSIS — G5603 Carpal tunnel syndrome, bilateral upper limbs: Secondary | ICD-10-CM | POA: Diagnosis not present

## 2018-06-12 DIAGNOSIS — M180 Bilateral primary osteoarthritis of first carpometacarpal joints: Secondary | ICD-10-CM | POA: Diagnosis not present

## 2018-06-18 DIAGNOSIS — G5603 Carpal tunnel syndrome, bilateral upper limbs: Secondary | ICD-10-CM | POA: Diagnosis not present

## 2018-06-20 DIAGNOSIS — R7301 Impaired fasting glucose: Secondary | ICD-10-CM | POA: Diagnosis not present

## 2018-06-20 DIAGNOSIS — E782 Mixed hyperlipidemia: Secondary | ICD-10-CM | POA: Diagnosis not present

## 2018-06-20 DIAGNOSIS — G5603 Carpal tunnel syndrome, bilateral upper limbs: Secondary | ICD-10-CM | POA: Diagnosis not present

## 2018-06-20 DIAGNOSIS — R233 Spontaneous ecchymoses: Secondary | ICD-10-CM | POA: Diagnosis not present

## 2018-06-20 DIAGNOSIS — I10 Essential (primary) hypertension: Secondary | ICD-10-CM | POA: Diagnosis not present

## 2018-06-26 DIAGNOSIS — M180 Bilateral primary osteoarthritis of first carpometacarpal joints: Secondary | ICD-10-CM | POA: Diagnosis not present

## 2018-06-26 DIAGNOSIS — G5603 Carpal tunnel syndrome, bilateral upper limbs: Secondary | ICD-10-CM | POA: Diagnosis not present

## 2018-06-30 DIAGNOSIS — M1811 Unilateral primary osteoarthritis of first carpometacarpal joint, right hand: Secondary | ICD-10-CM | POA: Diagnosis not present

## 2018-06-30 DIAGNOSIS — G5601 Carpal tunnel syndrome, right upper limb: Secondary | ICD-10-CM | POA: Diagnosis not present

## 2018-07-03 DIAGNOSIS — G5603 Carpal tunnel syndrome, bilateral upper limbs: Secondary | ICD-10-CM

## 2018-07-03 DIAGNOSIS — M79642 Pain in left hand: Secondary | ICD-10-CM

## 2018-07-03 DIAGNOSIS — M79641 Pain in right hand: Secondary | ICD-10-CM

## 2018-07-03 HISTORY — DX: Pain in right hand: M79.642

## 2018-07-03 HISTORY — DX: Pain in right hand: M79.641

## 2018-07-03 HISTORY — DX: Carpal tunnel syndrome, bilateral upper limbs: G56.03

## 2018-07-15 DIAGNOSIS — Z23 Encounter for immunization: Secondary | ICD-10-CM | POA: Diagnosis not present

## 2018-09-22 DIAGNOSIS — E663 Overweight: Secondary | ICD-10-CM | POA: Diagnosis not present

## 2018-09-22 DIAGNOSIS — M8589 Other specified disorders of bone density and structure, multiple sites: Secondary | ICD-10-CM | POA: Diagnosis not present

## 2018-09-22 DIAGNOSIS — Z0001 Encounter for general adult medical examination with abnormal findings: Secondary | ICD-10-CM | POA: Diagnosis not present

## 2018-09-22 DIAGNOSIS — Z6826 Body mass index (BMI) 26.0-26.9, adult: Secondary | ICD-10-CM | POA: Diagnosis not present

## 2018-09-22 DIAGNOSIS — I129 Hypertensive chronic kidney disease with stage 1 through stage 4 chronic kidney disease, or unspecified chronic kidney disease: Secondary | ICD-10-CM | POA: Diagnosis not present

## 2018-09-22 DIAGNOSIS — Z1231 Encounter for screening mammogram for malignant neoplasm of breast: Secondary | ICD-10-CM | POA: Diagnosis not present

## 2018-10-06 DIAGNOSIS — E782 Mixed hyperlipidemia: Secondary | ICD-10-CM | POA: Diagnosis not present

## 2018-10-06 DIAGNOSIS — J208 Acute bronchitis due to other specified organisms: Secondary | ICD-10-CM | POA: Diagnosis not present

## 2018-10-06 DIAGNOSIS — I129 Hypertensive chronic kidney disease with stage 1 through stage 4 chronic kidney disease, or unspecified chronic kidney disease: Secondary | ICD-10-CM | POA: Diagnosis not present

## 2018-10-06 DIAGNOSIS — R7301 Impaired fasting glucose: Secondary | ICD-10-CM | POA: Diagnosis not present

## 2018-10-29 DIAGNOSIS — M8589 Other specified disorders of bone density and structure, multiple sites: Secondary | ICD-10-CM | POA: Diagnosis not present

## 2018-10-29 DIAGNOSIS — Z1231 Encounter for screening mammogram for malignant neoplasm of breast: Secondary | ICD-10-CM | POA: Diagnosis not present

## 2018-10-29 LAB — HM DEXA SCAN

## 2018-10-29 LAB — HM MAMMOGRAPHY

## 2018-11-11 DIAGNOSIS — M8589 Other specified disorders of bone density and structure, multiple sites: Secondary | ICD-10-CM | POA: Diagnosis not present

## 2018-11-11 DIAGNOSIS — I129 Hypertensive chronic kidney disease with stage 1 through stage 4 chronic kidney disease, or unspecified chronic kidney disease: Secondary | ICD-10-CM | POA: Diagnosis not present

## 2018-11-11 DIAGNOSIS — I10 Essential (primary) hypertension: Secondary | ICD-10-CM | POA: Diagnosis not present

## 2019-01-12 DIAGNOSIS — M79671 Pain in right foot: Secondary | ICD-10-CM | POA: Diagnosis not present

## 2019-01-12 DIAGNOSIS — E782 Mixed hyperlipidemia: Secondary | ICD-10-CM | POA: Diagnosis not present

## 2019-01-12 DIAGNOSIS — K580 Irritable bowel syndrome with diarrhea: Secondary | ICD-10-CM | POA: Diagnosis not present

## 2019-01-12 DIAGNOSIS — R7301 Impaired fasting glucose: Secondary | ICD-10-CM | POA: Diagnosis not present

## 2019-01-12 DIAGNOSIS — I129 Hypertensive chronic kidney disease with stage 1 through stage 4 chronic kidney disease, or unspecified chronic kidney disease: Secondary | ICD-10-CM | POA: Diagnosis not present

## 2019-01-12 DIAGNOSIS — M79672 Pain in left foot: Secondary | ICD-10-CM | POA: Diagnosis not present

## 2019-01-13 DIAGNOSIS — I129 Hypertensive chronic kidney disease with stage 1 through stage 4 chronic kidney disease, or unspecified chronic kidney disease: Secondary | ICD-10-CM | POA: Diagnosis not present

## 2019-01-13 DIAGNOSIS — R7301 Impaired fasting glucose: Secondary | ICD-10-CM | POA: Diagnosis not present

## 2019-01-13 DIAGNOSIS — E782 Mixed hyperlipidemia: Secondary | ICD-10-CM | POA: Diagnosis not present

## 2019-01-26 ENCOUNTER — Ambulatory Visit: Payer: Medicare Other | Admitting: Podiatry

## 2019-02-02 ENCOUNTER — Ambulatory Visit: Payer: Medicare Other | Admitting: Podiatry

## 2019-02-23 ENCOUNTER — Ambulatory Visit: Payer: Medicare Other | Admitting: Podiatry

## 2019-03-20 ENCOUNTER — Other Ambulatory Visit: Payer: Self-pay

## 2019-03-23 ENCOUNTER — Encounter: Payer: Self-pay | Admitting: Podiatry

## 2019-03-23 ENCOUNTER — Ambulatory Visit (INDEPENDENT_AMBULATORY_CARE_PROVIDER_SITE_OTHER): Payer: Medicare Other | Admitting: Podiatry

## 2019-03-23 ENCOUNTER — Telehealth: Payer: Self-pay | Admitting: *Deleted

## 2019-03-23 ENCOUNTER — Other Ambulatory Visit: Payer: Self-pay | Admitting: Podiatry

## 2019-03-23 ENCOUNTER — Other Ambulatory Visit: Payer: Self-pay

## 2019-03-23 ENCOUNTER — Ambulatory Visit (INDEPENDENT_AMBULATORY_CARE_PROVIDER_SITE_OTHER): Payer: Medicare Other

## 2019-03-23 VITALS — BP 135/74 | HR 67 | Temp 97.5°F | Resp 16 | Ht 66.0 in | Wt 150.0 lb

## 2019-03-23 DIAGNOSIS — M21961 Unspecified acquired deformity of right lower leg: Secondary | ICD-10-CM

## 2019-03-23 DIAGNOSIS — M2011 Hallux valgus (acquired), right foot: Secondary | ICD-10-CM

## 2019-03-23 DIAGNOSIS — M2061 Acquired deformities of toe(s), unspecified, right foot: Secondary | ICD-10-CM

## 2019-03-23 DIAGNOSIS — Z969 Presence of functional implant, unspecified: Secondary | ICD-10-CM | POA: Diagnosis not present

## 2019-03-23 DIAGNOSIS — M2041 Other hammer toe(s) (acquired), right foot: Secondary | ICD-10-CM

## 2019-03-23 NOTE — Progress Notes (Signed)
Subjective:  Patient ID: Pamela Schwartz, female    DOB: 10-Oct-1941,  MRN: [id number]  Chief Complaint  Patient presents with  . Foot Pain    Rt 2nd toe medial side lesion and pain x 7 yrs -pt states pain is always a high 7/10 most of the time -pt states,"started after they did sx on that toe, it never healed good and now it rubs against the big toe." Tx: toe cap -pt denies N/V/F/Ch -pt denies Redness/swelling/drainage     78 y.o. female presents with the above complaint. Hx above confirmed with patinet.   Review of Systems: Negative except as noted in the HPI. Denies N/V/F/Ch.  Past Medical History:  Diagnosis Date  . Arthritis   . Cancer [facility])    denies  . Chronic kidney disease   . GERD (gastroesophageal reflux disease)   . Hearing loss   . Hypercholesterolemia   . Hypertension   . IBS (irritable bowel syndrome)   . Scars     Current Outpatient Medications:  .  amLODipine (NORVASC) 10 MG tablet, Take 10 mg by mouth daily., Disp: , Rfl: 1 .  aspirin 81 MG tablet, Take 81 mg by mouth daily., Disp: , Rfl:  .  chlorthalidone (HYGROTON) 25 MG tablet, Take 25 mg by mouth daily., Disp: , Rfl:  .  cyclobenzaprine (FLEXERIL) 10 MG tablet, Take 1 tablet (10 mg total) by mouth 3 (three) times daily as needed for muscle spasms., Disp: 30 tablet, Rfl: 0 .  dicyclomine (BENTYL) 20 MG tablet, Take 20 mg by mouth 4 (four) times daily -  before meals and at bedtime., Disp: , Rfl: 1 .  gemfibrozil (LOPID) 600 MG tablet, Take 600 mg by mouth 2 (two) times daily before a meal., Disp: , Rfl:  .  metoprolol (TOPROL-XL) 200 MG 24 hr tablet, Take 300 mg by mouth daily., Disp: , Rfl:  .  Multiple Vitamins-Minerals (PRESERVISION AREDS PO), Take 1 tablet by mouth 2 (two) times daily., Disp: , Rfl:  .  pantoprazole (PROTONIX) 40 MG tablet, Take 40 mg by mouth daily., Disp: , Rfl: 1 .  pravastatin (PRAVACHOL) 20 MG tablet, Take 20 mg by mouth at bedtime., Disp: , Rfl: 1 .  traMADol (ULTRAM) 50 MG  tablet, Take 50 mg by mouth every 8 (eight) hours as needed for moderate pain. , Disp: , Rfl: 2 .  Vitamin D, Ergocalciferol, (DRISDOL) 50000 units CAPS capsule, Take 50,000 Units by mouth 2 (two) times daily. Monday and Thursday, Disp: , Rfl: 1 .  zolpidem (AMBIEN CR) 12.5 MG CR tablet, Take 12.5 mg by mouth at bedtime., Disp: , Rfl: 1  Social History   Tobacco Use  Smoking Status Former Smoker  . Packs/day: 0.50  . Types: Cigarettes  . Last attempt to quit: 10/02/2015  . Years since quitting: 3.4  Smokeless Tobacco Never Used    Allergies  Allergen Reactions  . Nickel   . Tape Other (See Comments)    adhesive adhesive   Objective:   Vitals:   03/23/19 1032  BP: 135/74  Pulse: 67  Resp: 16  Temp: (!) 97.5 F (36.4 C)   Body mass index is 24.21 kg/m. Constitutional Well developed. Well nourished.  Vascular Dorsalis pedis pulses palpable bilaterally. Posterior tibial pulses palpable bilaterally. Capillary refill normal to all digits.  No cyanosis or clubbing noted. Pedal hair growth normal.  Neurologic Normal speech. Oriented to person, place, and time. Epicritic sensation to light touch grossly present bilaterally.  Dermatologic Nails  well groomed and normal in appearance. No open wounds. R 2nd PIPJ medial HPK.  Orthopedic: POP right 2nd toe with hammertoe deformity.Flexible Residual hallus interphalangeus deformity.    Radiographs: taken and reviewed hammertoe noted right 2nd toe and evidence of hardware intact right great toe. Residual hallux interphalangeus noted. Assessment:   1. Hammertoe of right foot   2. Hallux interphalangeus, acquired, right   3. Presence of retained hardware   4. Acquired deformity of right toe    Plan:  Patient was evaluated and treated and all questions answered.  Hammertoe right 2nd toe -X-rays reviewed as above -Discussed with patient surgical correction for the above deformity.  Discussed hammertoe correction with likely  screw fixation.  Discussed possible concomitant removal of hardware right great toe with revision Akin osteotomy.  All risk benefits alternatives of surgery were discussed with the patient no guarantees were given -Patient has failed all conservative therapy and wishes to proceed with surgical intervention. All risks, benefits, and alternatives discussed with patient. No guarantees given. Consent reviewed and signed by patient. -Planned procedures: Right second hammertoe correction, possible Akin osteotomy with removal of hardware right great toe  No follow-ups on file.

## 2019-03-23 NOTE — Telephone Encounter (Addendum)
"  I saw Dr. March Rummage today.  I need to schedule my surgery."  I don't have your information at this time.  "Would you like me to call you back tomorrow?"  That would be great, thank you.

## 2019-03-23 NOTE — Patient Instructions (Signed)
Pre-Operative Instructions  Congratulations, you have decided to take an important step towards improving your quality of life.  You can be assured that the doctors and staff at [facility] will be with you every step of the way.  Here are some important things you should know:  1. Plan to be at the surgery center/hospital at least 1 (one) hour prior to your scheduled time, unless otherwise directed by the surgical center/hospital staff.  You must have a responsible adult accompany you, remain during the surgery and drive you home.  Make sure you have directions to the surgical center/hospital to ensure you arrive on time. 2. If you are having surgery at [facility] or [place] [facility] hospitals, you will need a copy of your medical history and physical form from your family physician within one month prior to the date of surgery. We will give you a form for your primary physician to complete.  3. We make every effort to accommodate the date you request for surgery.  However, there are times where surgery dates or times have to be moved.  We will contact you as soon as possible if a change in schedule is required.   4. No aspirin/ibuprofen for one week before surgery.  If you are on aspirin, any non-steroidal anti-inflammatory medications (Mobic, Aleve, Ibuprofen) should not be taken seven (7) days prior to your surgery.  You make take Tylenol for pain prior to surgery.  5. Medications - If you are taking daily heart and blood pressure medications, seizure, reflux, allergy, asthma, anxiety, pain or diabetes medications, make sure you notify the surgery center/hospital before the day of surgery so they can tell you which medications you should take or avoid the day of surgery. 6. No food or drink after midnight the night before surgery unless directed otherwise by surgical center/hospital staff. 7. No alcoholic beverages 24-hours prior to surgery.  No smoking 24-hours prior or 24-hours after  surgery. 8. Wear loose pants or shorts. They should be loose enough to fit over bandages, boots, and casts. 9. Don't wear slip-on shoes. Sneakers are preferred. 10. Bring your boot with you to the surgery center/hospital.  Also bring crutches or a walker if your physician has prescribed it for you.  If you do not have this equipment, it will be provided for you after surgery. 11. If you have not been contacted by the surgery center/hospital by the day before your surgery, call to confirm the date and time of your surgery. 12. Leave-time from work may vary depending on the type of surgery you have.  Appropriate arrangements should be made prior to surgery with your employer. 13. Prescriptions will be provided immediately following surgery by your doctor.  Fill these as soon as possible after surgery and take the medication as directed. Pain medications will not be refilled on weekends and must be approved by the doctor. 14. Remove nail polish on the operative foot and avoid getting pedicures prior to surgery. 15. Wash the night before surgery.  The night before surgery wash the foot and leg well with water and the antibacterial soap provided. Be sure to pay special attention to beneath the toenails and in between the toes.  Wash for at least three (3) minutes. Rinse thoroughly with water and dry well with a towel.  Perform this wash unless told not to do so by your physician.  Enclosed: 1 Ice pack (please put in freezer the night before surgery)   1 Hibiclens skin cleaner     Pre-op instructions  If you have any questions regarding the instructions, please do not hesitate to call our office.  [place]: [address], [place], [place] [address] -- [phone]  [place]: [address] [place], [place] [address] -- [phone]  [place]: [address]  , [place] [address] -- [phone]  [place]: [address], [place], [place] [address] -- [phone]  Website:  [email or web address] 

## 2019-03-23 NOTE — Progress Notes (Signed)
   Subjective:    Patient ID: Pamela Schwartz, female    DOB: 1941-04-26, 78 y.o.   MRN: [id number]  HPI    Review of Systems  Musculoskeletal: Positive for arthralgias and myalgias.  All other systems reviewed and are negative.      Objective:   Physical Exam        Assessment & Plan:

## 2019-03-24 NOTE — Telephone Encounter (Addendum)
"  I'm calling to schedule my surgery with Dr. March Rummage."  Do you have a date that you would like?  "I don't have a particular day."  Dr. March Rummage can do it on May 06, 2019.  "That date will be fine."  Dr. March Rummage wants medical clearance.  Who is your primary care physician?  "Dr. Rochel Brome is my doctor."  Someone from the surgical center will call you a day or two prior to your surgery date.  They will give you your arrival time.  You need to register with the surgical center online, instructions are in the brochure that we gave you.

## 2019-03-27 ENCOUNTER — Encounter: Payer: Self-pay | Admitting: *Deleted

## 2019-03-27 NOTE — Progress Notes (Signed)
Per Dr. March Rummage, I sent a surgical medical clearance request letter to Dr. Rochel Brome. Ms. Pamela Schwartz is scheduled for surgery on May 06, 2019.

## 2019-04-20 DIAGNOSIS — F5101 Primary insomnia: Secondary | ICD-10-CM | POA: Diagnosis not present

## 2019-04-20 DIAGNOSIS — E782 Mixed hyperlipidemia: Secondary | ICD-10-CM | POA: Diagnosis not present

## 2019-04-20 DIAGNOSIS — M545 Low back pain: Secondary | ICD-10-CM | POA: Diagnosis not present

## 2019-04-20 DIAGNOSIS — K580 Irritable bowel syndrome with diarrhea: Secondary | ICD-10-CM | POA: Diagnosis not present

## 2019-04-20 DIAGNOSIS — Z01810 Encounter for preprocedural cardiovascular examination: Secondary | ICD-10-CM | POA: Diagnosis not present

## 2019-04-20 DIAGNOSIS — I129 Hypertensive chronic kidney disease with stage 1 through stage 4 chronic kidney disease, or unspecified chronic kidney disease: Secondary | ICD-10-CM | POA: Diagnosis not present

## 2019-04-20 DIAGNOSIS — R7301 Impaired fasting glucose: Secondary | ICD-10-CM | POA: Diagnosis not present

## 2019-04-20 DIAGNOSIS — K219 Gastro-esophageal reflux disease without esophagitis: Secondary | ICD-10-CM | POA: Diagnosis not present

## 2019-04-21 DIAGNOSIS — I129 Hypertensive chronic kidney disease with stage 1 through stage 4 chronic kidney disease, or unspecified chronic kidney disease: Secondary | ICD-10-CM | POA: Diagnosis not present

## 2019-04-21 DIAGNOSIS — Z7901 Long term (current) use of anticoagulants: Secondary | ICD-10-CM | POA: Diagnosis not present

## 2019-04-21 DIAGNOSIS — Z01810 Encounter for preprocedural cardiovascular examination: Secondary | ICD-10-CM | POA: Diagnosis not present

## 2019-04-21 DIAGNOSIS — E782 Mixed hyperlipidemia: Secondary | ICD-10-CM | POA: Diagnosis not present

## 2019-04-21 DIAGNOSIS — R7301 Impaired fasting glucose: Secondary | ICD-10-CM | POA: Diagnosis not present

## 2019-04-21 DIAGNOSIS — Z01811 Encounter for preprocedural respiratory examination: Secondary | ICD-10-CM | POA: Diagnosis not present

## 2019-04-30 ENCOUNTER — Telehealth: Payer: Self-pay | Admitting: *Deleted

## 2019-04-30 NOTE — Telephone Encounter (Signed)
DOS 05/06/2019; CPT CODES: 00370 - Barbie Banner OSTEOTOMY, 20680 - REMOVAL FIXATION DEEP KWIRE/SCREW, AND 48889 - HAMMER TOE REPAIR 2ND RIGHT FOOT  MEDICARE: E-verified   BCBS: Effective Date - 11/12/2018 - 11/11/9998   In-Network Max Per Benefit Period Year-to-Date Remaining CoInsurance 30% Deductible $1500.00 $1500.00 Out-Of-Pocket $5900.00 $5457.58  AMBULATORY SURGERY  In Network  Copay Coinsurance  Not Applicable  16% per [place]

## 2019-05-06 ENCOUNTER — Other Ambulatory Visit: Payer: Self-pay | Admitting: Podiatry

## 2019-05-06 ENCOUNTER — Encounter: Payer: Self-pay | Admitting: Podiatry

## 2019-05-06 DIAGNOSIS — M960 Pseudarthrosis after fusion or arthrodesis: Secondary | ICD-10-CM | POA: Diagnosis not present

## 2019-05-06 DIAGNOSIS — M2041 Other hammer toe(s) (acquired), right foot: Secondary | ICD-10-CM | POA: Diagnosis not present

## 2019-05-06 DIAGNOSIS — I10 Essential (primary) hypertension: Secondary | ICD-10-CM | POA: Diagnosis not present

## 2019-05-06 MED ORDER — CEPHALEXIN 500 MG PO CAPS: 500.0000 mg | ORAL_CAPSULE | Freq: Two times a day (BID) | ORAL | 0 refills | Status: DC

## 2019-05-06 MED ORDER — OXYCODONE-ACETAMINOPHEN 5-325 MG PO TABS: 1.0000 | ORAL_TABLET | ORAL | 0 refills | Status: DC | PRN

## 2019-05-06 NOTE — Addendum Note (Signed)
Addended by: Hardie Pulley on: 05/06/2019 04:53 PM   Modules accepted: Orders

## 2019-05-06 NOTE — Progress Notes (Signed)
Rx sent to pharmacy for outpatient surgery. °

## 2019-05-07 ENCOUNTER — Telehealth: Payer: Self-pay

## 2019-05-07 NOTE — Telephone Encounter (Signed)
POST OP CALL-    1) General condition stated by the patient:   2) Is the pt having pain?   3) Pain score:   4) Has the pt taken Rx'd pain medication, regularly or PRN?   5) Is the pain medication giving relief?  6) Any fever, chills, nausea, or vomiting, shortness of breath or tightness in calf?  7) Is the bandage clean, dry and intact?  8) Is there excessive tightness, bleeding or drainage coming through the bandage?  9) Did you understand all of the post op instruction sheet given?  10) Any questions or concerns regarding post op care/recovery?    Confirmed POV appointment with patient   Left voicemail for pt stating if they had any questions or concerns to please give Dr. Eleanora Neighbor office a call.

## 2019-05-12 ENCOUNTER — Other Ambulatory Visit: Payer: Self-pay

## 2019-05-12 ENCOUNTER — Encounter: Payer: Self-pay | Admitting: Podiatry

## 2019-05-12 ENCOUNTER — Ambulatory Visit (INDEPENDENT_AMBULATORY_CARE_PROVIDER_SITE_OTHER): Payer: Medicare Other | Admitting: Podiatry

## 2019-05-12 ENCOUNTER — Ambulatory Visit (INDEPENDENT_AMBULATORY_CARE_PROVIDER_SITE_OTHER): Payer: Medicare Other

## 2019-05-12 ENCOUNTER — Other Ambulatory Visit: Payer: Self-pay | Admitting: Podiatry

## 2019-05-12 VITALS — BP 127/74 | HR 74 | Temp 97.8°F | Resp 16

## 2019-05-12 DIAGNOSIS — M2041 Other hammer toe(s) (acquired), right foot: Secondary | ICD-10-CM

## 2019-05-12 DIAGNOSIS — Z9889 Other specified postprocedural states: Secondary | ICD-10-CM

## 2019-05-12 NOTE — Progress Notes (Signed)
Subjective:  Patient ID: Pamela Schwartz, female    DOB: 02-19-1941,  MRN: [id number]  Chief Complaint  Patient presents with  . Routine Post Op    pov#1 dos 06.24.2020 Hammertoe Repair 2nd Rt, Aiken Osteotomy Hallux Rt, Remvoal Fixation Deep Kwire/Screw Rt  -pt states," had one night that pain was realy bad, couldn't sleep (Thurs. night), otherwise doing fine; 4/10 jpain." Tx: sx shoe, and oxycodone (PRN) -pt denies N/V/F?[name]     DOS: 05/06/2019 Procedure: Right 2nd Hammertoe repair  78 y.o. female returns for post-op check. Hx as above.  Review of Systems: Negative except as noted in the HPI. Denies N/V/F/Ch.  Past Medical History:  Diagnosis Date  . Arthritis   . Cancer Pamela Schwartz)    denies  . Chronic kidney disease   . GERD (gastroesophageal reflux disease)   . Hearing loss   . Hypercholesterolemia   . Hypertension   . IBS (irritable bowel syndrome)   . Scars     Current Outpatient Medications:  .  amLODipine (NORVASC) 10 MG tablet, Take 10 mg by mouth daily., Disp: , Rfl: 1 .  aspirin 81 MG tablet, Take 81 mg by mouth daily., Disp: , Rfl:  .  cephALEXin (KEFLEX) 500 MG capsule, Take 1 capsule (500 mg total) by mouth 2 (two) times daily., Disp: 14 capsule, Rfl: 0 .  chlorthalidone (HYGROTON) 25 MG tablet, Take 25 mg by mouth daily., Disp: , Rfl:  .  cyclobenzaprine (FLEXERIL) 10 MG tablet, Take 1 tablet (10 mg total) by mouth 3 (three) times daily as needed for muscle spasms., Disp: 30 tablet, Rfl: 0 .  dicyclomine (BENTYL) 20 MG tablet, Take 20 mg by mouth 4 (four) times daily -  before meals and at bedtime., Disp: , Rfl: 1 .  gemfibrozil (LOPID) 600 MG tablet, Take 600 mg by mouth 2 (two) times daily before a meal., Disp: , Rfl:  .  metoprolol (TOPROL-XL) 200 MG 24 hr tablet, Take 300 mg by mouth daily., Disp: , Rfl:  .  Multiple Vitamins-Minerals (PRESERVISION AREDS PO), Take 1 tablet by mouth 2 (two) times daily., Disp: , Rfl:  .  omega-3 acid ethyl esters (LOVAZA) 1 g  capsule, Take 2 capsules by mouth 2 (two) times daily., Disp: , Rfl:  .  oxyCODONE-acetaminophen (PERCOCET) 5-325 MG tablet, Take 1 tablet by mouth every 4 (four) hours as needed for severe pain., Disp: 20 tablet, Rfl: 0 .  pantoprazole (PROTONIX) 40 MG tablet, Take 40 mg by mouth daily., Disp: , Rfl: 1 .  pravastatin (PRAVACHOL) 20 MG tablet, Take 20 mg by mouth at bedtime., Disp: , Rfl: 1 .  promethazine (PHENERGAN) 25 MG tablet, take 1 tablet (25 mg) by oral route 4 times per day prn nausea/vomiting, Disp: , Rfl:  .  traMADol (ULTRAM) 50 MG tablet, Take 50 mg by mouth every 8 (eight) hours as needed for moderate pain. , Disp: , Rfl: 2 .  Vitamin D, Ergocalciferol, (DRISDOL) 50000 units CAPS capsule, Take 50,000 Units by mouth 2 (two) times daily. Monday and Thursday, Disp: , Rfl: 1 .  zolpidem (AMBIEN CR) 12.5 MG CR tablet, Take 12.5 mg by mouth at bedtime., Disp: , Rfl: 1  Social History   Tobacco Use  Smoking Status Former Smoker  . Packs/day: 0.50  . Types: Cigarettes  . Quit date: 10/02/2015  . Years since quitting: 3.6  Smokeless Tobacco Never Used    Allergies  Allergen Reactions  . Nickel   . Tape Other (See Comments)  adhesive adhesive   Objective:   Vitals:   05/12/19 1335  BP: 127/74  Pulse: 74  Resp: 16  Temp: 97.8 F (36.6 C)   There is no height or weight on file to calculate BMI. Constitutional Well developed. Well nourished.  Vascular Foot warm and well perfused. Capillary refill normal to all digits.   Neurologic Normal speech. Oriented to person, place, and time. Epicritic sensation to light touch grossly present bilaterally.  Dermatologic Skin healing well without signs of infection. Skin edges well coapted without signs of infection. Right 2nd toe swollen. No signs of acute infection.  Orthopedic: Tenderness to palpation noted about the surgical site.   Radiographs: Taken and reviewed. S/p 2nd hammertoe repair with intact screw. Assessment:    1. Hammertoe of right foot   2. Post-operative state    Plan:  Patient was evaluated and treated and all questions answered.  S/p foot surgery right -Progressing as expected post-operatively. -XR: AS above -WB Status: WBAT in surgical shoe -Sutures: intact. -Medications: none refilled. -Foot redressed.  No follow-ups on file.

## 2019-05-18 ENCOUNTER — Ambulatory Visit (INDEPENDENT_AMBULATORY_CARE_PROVIDER_SITE_OTHER): Payer: Medicare Other | Admitting: Podiatry

## 2019-05-18 ENCOUNTER — Other Ambulatory Visit: Payer: Self-pay

## 2019-05-18 DIAGNOSIS — Z9889 Other specified postprocedural states: Secondary | ICD-10-CM

## 2019-05-18 NOTE — Progress Notes (Signed)
Subjective:  Patient ID: Pamela Schwartz, female    DOB: 1941/07/28,  MRN: [id number]  No chief complaint on file.  DOS: 05/06/2019 Procedure: Right 2nd Hammertoe repair  78 y.o. female returns for post-op check. Only having pain when she moves the toes up and down.  Review of Systems: Negative except as noted in the HPI. Denies N/V/F/Ch.  Past Medical History:  Diagnosis Date   Arthritis    Cancer ([facility])    denies   Chronic kidney disease    GERD (gastroesophageal reflux disease)    Hearing loss    Hypercholesterolemia    Hypertension    IBS (irritable bowel syndrome)    Scars     Current Outpatient Medications:    amLODipine (NORVASC) 10 MG tablet, Take 10 mg by mouth daily., Disp: , Rfl: 1   aspirin 81 MG tablet, Take 81 mg by mouth daily., Disp: , Rfl:    cephALEXin (KEFLEX) 500 MG capsule, Take 1 capsule (500 mg total) by mouth 2 (two) times daily., Disp: 14 capsule, Rfl: 0   chlorthalidone (HYGROTON) 25 MG tablet, Take 25 mg by mouth daily., Disp: , Rfl:    cyclobenzaprine (FLEXERIL) 10 MG tablet, Take 1 tablet (10 mg total) by mouth 3 (three) times daily as needed for muscle spasms., Disp: 30 tablet, Rfl: 0   dicyclomine (BENTYL) 20 MG tablet, Take 20 mg by mouth 4 (four) times daily -  before meals and at bedtime., Disp: , Rfl: 1   gemfibrozil (LOPID) 600 MG tablet, Take 600 mg by mouth 2 (two) times daily before a meal., Disp: , Rfl:    metoprolol (TOPROL-XL) 200 MG 24 hr tablet, Take 300 mg by mouth daily., Disp: , Rfl:    Multiple Vitamins-Minerals (PRESERVISION AREDS PO), Take 1 tablet by mouth 2 (two) times daily., Disp: , Rfl:    omega-3 acid ethyl esters (LOVAZA) 1 g capsule, Take 2 capsules by mouth 2 (two) times daily., Disp: , Rfl:    oxyCODONE-acetaminophen (PERCOCET) 5-325 MG tablet, Take 1 tablet by mouth every 4 (four) hours as needed for severe pain., Disp: 20 tablet, Rfl: 0   pantoprazole (PROTONIX) 40 MG tablet, Take 40 mg by mouth  daily., Disp: , Rfl: 1   pravastatin (PRAVACHOL) 20 MG tablet, Take 20 mg by mouth at bedtime., Disp: , Rfl: 1   promethazine (PHENERGAN) 25 MG tablet, take 1 tablet (25 mg) by oral route 4 times per day prn nausea/vomiting, Disp: , Rfl:    traMADol (ULTRAM) 50 MG tablet, Take 50 mg by mouth every 8 (eight) hours as needed for moderate pain. , Disp: , Rfl: 2   Vitamin D, Ergocalciferol, (DRISDOL) 50000 units CAPS capsule, Take 50,000 Units by mouth 2 (two) times daily. Monday and Thursday, Disp: , Rfl: 1   zolpidem (AMBIEN CR) 12.5 MG CR tablet, Take 12.5 mg by mouth at bedtime., Disp: , Rfl: 1  Social History   Tobacco Use  Smoking Status Former Smoker   Packs/day: 0.50   Types: Cigarettes   Quit date: 10/02/2015   Years since quitting: 3.6  Smokeless Tobacco Never Used    Allergies  Allergen Reactions   Nickel    Tape Other (See Comments)    adhesive adhesive   Objective:   There were no vitals filed for this visit. There is no height or weight on file to calculate BMI. Constitutional Well developed. Well nourished.  Vascular Foot warm and well perfused. Capillary refill normal to all digits.  Neurologic Normal speech. Oriented to person, place, and time. Epicritic sensation to light touch grossly present bilaterally.  Dermatologic Skin healing well without signs of infection. Skin edges well coapted without signs of infection. Right 2nd toe swollen. No signs of acute infection.  Orthopedic: Tenderness to palpation noted about the surgical site.   Radiographs: None  Assessment:   No diagnosis found. Plan:  Patient was evaluated and treated and all questions answered.  S/p foot surgery right -Progressing as expected post-operatively. -XR: AS above -WB Status: WBAT in surgical shoe -Sutures: intact. Removed distal suture but others left intact. -Medications: none refilled. -Foot redressed.  No follow-ups on file.

## 2019-05-25 ENCOUNTER — Ambulatory Visit (INDEPENDENT_AMBULATORY_CARE_PROVIDER_SITE_OTHER): Payer: Medicare Other

## 2019-05-25 ENCOUNTER — Ambulatory Visit (INDEPENDENT_AMBULATORY_CARE_PROVIDER_SITE_OTHER): Payer: Medicare Other | Admitting: Podiatry

## 2019-05-25 ENCOUNTER — Other Ambulatory Visit: Payer: Self-pay

## 2019-05-25 ENCOUNTER — Telehealth: Payer: Self-pay | Admitting: Podiatry

## 2019-05-25 DIAGNOSIS — M2041 Other hammer toe(s) (acquired), right foot: Secondary | ICD-10-CM | POA: Diagnosis not present

## 2019-05-25 DIAGNOSIS — M2011 Hallux valgus (acquired), right foot: Secondary | ICD-10-CM | POA: Diagnosis not present

## 2019-05-25 DIAGNOSIS — Z9889 Other specified postprocedural states: Secondary | ICD-10-CM

## 2019-05-25 DIAGNOSIS — S93114A Dislocation of interphalangeal joint of right lesser toe(s), initial encounter: Secondary | ICD-10-CM | POA: Diagnosis not present

## 2019-05-25 NOTE — Patient Instructions (Signed)
Pre-Operative Instructions  Congratulations, you have decided to take an important step towards improving your quality of life.  You can be assured that the doctors and staff at [facility] will be with you every step of the way.  Here are some important things you should know:  1. Plan to be at the surgery center/hospital at least 1 (one) hour prior to your scheduled time, unless otherwise directed by the surgical center/hospital staff.  You must have a responsible adult accompany you, remain during the surgery and drive you home.  Make sure you have directions to the surgical center/hospital to ensure you arrive on time. 2. If you are having surgery at [facility] or [facility] hospitals, you will need a copy of your medical history and physical form from your family physician within one month prior to the date of surgery. We will give you a form for your primary physician to complete.  3. We make every effort to accommodate the date you request for surgery.  However, there are times where surgery dates or times have to be moved.  We will contact you as soon as possible if a change in schedule is required.   4. No aspirin/ibuprofen for one week before surgery.  If you are on aspirin, any non-steroidal anti-inflammatory medications (Mobic, Aleve, Ibuprofen) should not be taken seven (7) days prior to your surgery.  You make take Tylenol for pain prior to surgery.  5. Medications - If you are taking daily heart and blood pressure medications, seizure, reflux, allergy, asthma, anxiety, pain or diabetes medications, make sure you notify the surgery center/hospital before the day of surgery so they can tell you which medications you should take or avoid the day of surgery. 6. No food or drink after midnight the night before surgery unless directed otherwise by surgical center/hospital staff. 7. No alcoholic beverages 24-hours prior to surgery.  No smoking 24-hours prior or 24-hours after  surgery. 8. Wear loose pants or shorts. They should be loose enough to fit over bandages, boots, and casts. 9. Don't wear slip-on shoes. Sneakers are preferred. 10. Bring your boot with you to the surgery center/hospital.  Also bring crutches or a walker if your physician has prescribed it for you.  If you do not have this equipment, it will be provided for you after surgery. 11. If you have not been contacted by the surgery center/hospital by the day before your surgery, call to confirm the date and time of your surgery. 12. Leave-time from work may vary depending on the type of surgery you have.  Appropriate arrangements should be made prior to surgery with your employer. 13. Prescriptions will be provided immediately following surgery by your doctor.  Fill these as soon as possible after surgery and take the medication as directed. Pain medications will not be refilled on weekends and must be approved by the doctor. 14. Remove nail polish on the operative foot and avoid getting pedicures prior to surgery. 15. Wash the night before surgery.  The night before surgery wash the foot and leg well with water and the antibacterial soap provided. Be sure to pay special attention to beneath the toenails and in between the toes.  Wash for at least three (3) minutes. Rinse thoroughly with water and dry well with a towel.  Perform this wash unless told not to do so by your physician.  Enclosed: 1 Ice pack (please put in freezer the night before surgery)   1 Hibiclens skin cleaner     Pre-op instructions  If you have any questions regarding the instructions, please do not hesitate to call our office.  [place]: [address], [place], [place] [address] -- [phone]  [place]: [address] [place], [place] [address] -- [phone]  [place]: [address]  [place], [place] [address] -- [phone]  [place]: [address], [address], [place], [place] [address] -- [phone]  Website:  [email or web address] 

## 2019-05-25 NOTE — Telephone Encounter (Signed)
I saw Dr. March Rummage this morning in [employer] and he is wanting me to have surgery this Wednesday. Please call me back.

## 2019-05-25 NOTE — Progress Notes (Signed)
Subjective:  Patient ID: Pamela Schwartz, female    DOB: 26-Nov-1940,  MRN: [id number]  No chief complaint on file.  DOS: 05/06/2019 Procedure: Right 2nd Hammertoe repair  78 y.o. female returns for post-op check. Having more pain in the toe at this visit, recently got back from the beach. Denies other issues.  Review of Systems: Negative except as noted in the HPI. Denies N/V/F/Ch.  Past Medical History:  Diagnosis Date  . Arthritis   . Cancer [facility])    denies  . Chronic kidney disease   . GERD (gastroesophageal reflux disease)   . Hearing loss   . Hypercholesterolemia   . Hypertension   . IBS (irritable bowel syndrome)   . Scars     Current Outpatient Medications:  .  amLODipine (NORVASC) 10 MG tablet, Take 10 mg by mouth daily., Disp: , Rfl: 1 .  aspirin 81 MG tablet, Take 81 mg by mouth daily., Disp: , Rfl:  .  cephALEXin (KEFLEX) 500 MG capsule, Take 1 capsule (500 mg total) by mouth 2 (two) times daily., Disp: 14 capsule, Rfl: 0 .  chlorthalidone (HYGROTON) 25 MG tablet, Take 25 mg by mouth daily., Disp: , Rfl:  .  cyclobenzaprine (FLEXERIL) 10 MG tablet, Take 1 tablet (10 mg total) by mouth 3 (three) times daily as needed for muscle spasms., Disp: 30 tablet, Rfl: 0 .  dicyclomine (BENTYL) 20 MG tablet, Take 20 mg by mouth 4 (four) times daily -  before meals and at bedtime., Disp: , Rfl: 1 .  gemfibrozil (LOPID) 600 MG tablet, Take 600 mg by mouth 2 (two) times daily before a meal., Disp: , Rfl:  .  metoprolol (TOPROL-XL) 200 MG 24 hr tablet, Take 300 mg by mouth daily., Disp: , Rfl:  .  Multiple Vitamins-Minerals (PRESERVISION AREDS PO), Take 1 tablet by mouth 2 (two) times daily., Disp: , Rfl:  .  omega-3 acid ethyl esters (LOVAZA) 1 g capsule, Take 2 capsules by mouth 2 (two) times daily., Disp: , Rfl:  .  oxyCODONE-acetaminophen (PERCOCET) 5-325 MG tablet, Take 1 tablet by mouth every 4 (four) hours as needed for severe pain., Disp: 20 tablet, Rfl: 0 .  pantoprazole  (PROTONIX) 40 MG tablet, Take 40 mg by mouth daily., Disp: , Rfl: 1 .  pravastatin (PRAVACHOL) 20 MG tablet, Take 20 mg by mouth at bedtime., Disp: , Rfl: 1 .  promethazine (PHENERGAN) 25 MG tablet, take 1 tablet (25 mg) by oral route 4 times per day prn nausea/vomiting, Disp: , Rfl:  .  traMADol (ULTRAM) 50 MG tablet, Take 50 mg by mouth every 8 (eight) hours as needed for moderate pain. , Disp: , Rfl: 2 .  Vitamin D, Ergocalciferol, (DRISDOL) 50000 units CAPS capsule, Take 50,000 Units by mouth 2 (two) times daily. Monday and Thursday, Disp: , Rfl: 1 .  zolpidem (AMBIEN CR) 12.5 MG CR tablet, Take 12.5 mg by mouth at bedtime., Disp: , Rfl: 1  Social History   Tobacco Use  Smoking Status Former Smoker  . Packs/day: 0.50  . Types: Cigarettes  . Quit date: 10/02/2015  . Years since quitting: 3.6  Smokeless Tobacco Never Used    Allergies  Allergen Reactions  . Nickel   . Tape Other (See Comments)    adhesive adhesive   Objective:   There were no vitals filed for this visit. There is no height or weight on file to calculate BMI. Constitutional Well developed. Well nourished.  Vascular Foot warm and well perfused. Capillary  refill normal to all digits.   Neurologic Normal speech. Oriented to person, place, and time. Epicritic sensation to light touch grossly present bilaterally.  Dermatologic Skin healing well without signs of infection. Skin edges well coapted without signs of infection.   Orthopedic: Tenderness to palpation noted about the surgical site. Toe appears unstable at the PIPJ today. Screw appears palpable at distal aspect of the second toe right foot.   Radiographs: None  Assessment:   1. Post-operative state   2. Hammertoe of right foot   3. Hallux interphalangeus, acquired, right   4. Dislocation of interphalangeal joint of lesser toe, right, initial encounter    Plan:  Patient was evaluated and treated and all questions answered.  S/p foot surgery right  -Progressing as expected post-operatively. -XR: AS above. Screw backing out noted. - The toe did appear stable at last visit, patient was having no pain, now appears unstable at the PIPJ with pain. Patient states she has been compliant with surgical shoe. XR show backing out of the screw. Likely overuse leading to the screw backing out. She recently came back from the beach and admits to walking on the sand in the shoe. It is likely the uneven terrain caused stress on the surgical site despite wearing the shoe. -Will proceed with screw removal, reduction of hammertoe with screw vs pin fixation. Plan for procedure to be performed Wednesday. -WB Status: WBAT in surgical boot -Sutures: removed. Steri strip applied. -Medications: none refilled. -Foot redressed.  No follow-ups on file.

## 2019-05-25 NOTE — Telephone Encounter (Signed)
Called pt to let her know I got her message and that someone from [facility] will be calling her either today after 5:00 pm or tomorrow to let her know what time to arrive on Wednesday.

## 2019-05-27 ENCOUNTER — Encounter: Payer: Self-pay | Admitting: Podiatry

## 2019-05-27 ENCOUNTER — Other Ambulatory Visit: Payer: Self-pay | Admitting: Podiatry

## 2019-05-27 DIAGNOSIS — S93114A Dislocation of interphalangeal joint of right lesser toe(s), initial encounter: Secondary | ICD-10-CM | POA: Diagnosis not present

## 2019-05-27 DIAGNOSIS — Y999 Unspecified external cause status: Secondary | ICD-10-CM | POA: Diagnosis not present

## 2019-05-27 DIAGNOSIS — Z4889 Encounter for other specified surgical aftercare: Secondary | ICD-10-CM | POA: Diagnosis not present

## 2019-05-27 DIAGNOSIS — Y92832 Beach as the place of occurrence of the external cause: Secondary | ICD-10-CM | POA: Diagnosis not present

## 2019-05-27 DIAGNOSIS — Z472 Encounter for removal of internal fixation device: Secondary | ICD-10-CM | POA: Diagnosis not present

## 2019-05-27 DIAGNOSIS — Y939 Activity, unspecified: Secondary | ICD-10-CM | POA: Diagnosis not present

## 2019-05-27 DIAGNOSIS — S93114S Dislocation of interphalangeal joint of right lesser toe(s), sequela: Secondary | ICD-10-CM | POA: Diagnosis not present

## 2019-05-27 MED ORDER — OXYCODONE-ACETAMINOPHEN 5-325 MG PO TABS: 1.0000 | ORAL_TABLET | ORAL | 0 refills | Status: DC | PRN

## 2019-05-27 MED ORDER — CEPHALEXIN 500 MG PO CAPS: 500.0000 mg | ORAL_CAPSULE | Freq: Two times a day (BID) | ORAL | 0 refills | Status: DC

## 2019-05-28 ENCOUNTER — Telehealth: Payer: Self-pay

## 2019-05-28 NOTE — Telephone Encounter (Signed)
POST OP CALL-    1) General condition stated by the patient:  Doing good  2) Is the pt having pain? Not much  3) Pain score:   4) Has the pt taken Rx'd pain medication, regularly or PRN?   5) Is the pain medication giving relief?  6) Any fever, chills, nausea, or vomiting, shortness of breath or tightness in calf? None  7) Is the bandage clean, dry and intact? Yes  8) Is there excessive tightness, bleeding or drainage coming through the bandage? No  9) Did you understand all of the post op instruction sheet given? Yes  10) Any questions or concerns regarding post op care/recovery? No   Confirmed POV appointment with patient

## 2019-06-02 ENCOUNTER — Encounter: Payer: Medicare Other | Admitting: Podiatry

## 2019-06-03 ENCOUNTER — Ambulatory Visit (INDEPENDENT_AMBULATORY_CARE_PROVIDER_SITE_OTHER): Payer: Self-pay | Admitting: Sports Medicine

## 2019-06-03 ENCOUNTER — Other Ambulatory Visit: Payer: Self-pay

## 2019-06-03 ENCOUNTER — Encounter: Payer: Self-pay | Admitting: Sports Medicine

## 2019-06-03 ENCOUNTER — Other Ambulatory Visit: Payer: Self-pay | Admitting: Sports Medicine

## 2019-06-03 ENCOUNTER — Ambulatory Visit (INDEPENDENT_AMBULATORY_CARE_PROVIDER_SITE_OTHER): Payer: Medicare Other

## 2019-06-03 DIAGNOSIS — Z9889 Other specified postprocedural states: Secondary | ICD-10-CM | POA: Diagnosis not present

## 2019-06-03 DIAGNOSIS — M2041 Other hammer toe(s) (acquired), right foot: Secondary | ICD-10-CM

## 2019-06-03 NOTE — Progress Notes (Signed)
Subjective: Pamela Schwartz is a 78 y.o. female patient seen today in office for POV #1 (DOS 05/27/2019), S/P right second toe reduction of dislocated toe joint/hammertoe with K wire with Dr. March Rummage patient has a history of being noncompliant and walking and dislodging the previous fixation that she had the toe and having to have the surgery repeated. Patient denies pain at surgical site except some mild pain at night which she is taking over-the-counter pain reliever for, denies calf pain, denies headache, chest pain, shortness of breath, nausea, vomiting, fever, or chills. Patient states that she is listening this time and has been using her boot without any problems or issues and taking her antibiotics as instructed. No other issues noted.   Patient Active Problem List   Diagnosis Date Noted  . Bilateral carpal tunnel syndrome 07/03/2018  . Bilateral hand pain 07/03/2018  . Exostosis 07/16/2017  . Pain of toes of both feet 07/16/2017  . Cervical spinal stenosis 03/06/2016    Current Outpatient Medications on File Prior to Visit  Medication Sig Dispense Refill  . amLODipine (NORVASC) 10 MG tablet Take 10 mg by mouth daily.  1  . aspirin 81 MG tablet Take 81 mg by mouth daily.    . cephALEXin (KEFLEX) 500 MG capsule Take 1 capsule (500 mg total) by mouth 2 (two) times daily. 14 capsule 0  . chlorthalidone (HYGROTON) 25 MG tablet Take 25 mg by mouth daily.    . cyclobenzaprine (FLEXERIL) 10 MG tablet Take 1 tablet (10 mg total) by mouth 3 (three) times daily as needed for muscle spasms. 30 tablet 0  . dicyclomine (BENTYL) 20 MG tablet Take 20 mg by mouth 4 (four) times daily -  before meals and at bedtime.  1  . gemfibrozil (LOPID) 600 MG tablet Take 600 mg by mouth 2 (two) times daily before a meal.    . metoprolol (TOPROL-XL) 200 MG 24 hr tablet Take 300 mg by mouth daily.    . Multiple Vitamins-Minerals (PRESERVISION AREDS PO) Take 1 tablet by mouth 2 (two) times daily.    Marland Kitchen omega-3 acid ethyl  esters (LOVAZA) 1 g capsule Take 2 capsules by mouth 2 (two) times daily.    Marland Kitchen oxyCODONE-acetaminophen (PERCOCET) 5-325 MG tablet Take 1 tablet by mouth every 4 (four) hours as needed for severe pain. 20 tablet 0  . pantoprazole (PROTONIX) 40 MG tablet Take 40 mg by mouth daily.  1  . pravastatin (PRAVACHOL) 20 MG tablet Take 20 mg by mouth at bedtime.  1  . promethazine (PHENERGAN) 25 MG tablet take 1 tablet (25 mg) by oral route 4 times per day prn nausea/vomiting    . traMADol (ULTRAM) 50 MG tablet Take 50 mg by mouth every 8 (eight) hours as needed for moderate pain.   2  . Vitamin D, Ergocalciferol, (DRISDOL) 50000 units CAPS capsule Take 50,000 Units by mouth 2 (two) times daily. Monday and Thursday  1  . zolpidem (AMBIEN CR) 12.5 MG CR tablet Take 12.5 mg by mouth at bedtime.  1   No current facility-administered medications on file prior to visit.     Allergies  Allergen Reactions  . Nickel   . Tape Other (See Comments)    adhesive adhesive    Objective: There were no vitals filed for this visit.  General: No acute distress, AAOx3  Right incision site appears to be healing well K wire is intact at the distal tuft of the right second toe there is some mild  bloody drainage and sutures intact there is very hard stuck on Xeroform that was difficult to remove at this visit so I gently trimmed using a suture scissor any free hanging Xeroform and left the remainder intact because I did not want to disturb the pin site, no erythema, no warmth, no drainage, no signs of infection noted, Capillary fill time <3 seconds in all digits, gross sensation present via light touch to right foot consistent with postoperative status.  No pain or crepitation with range of motion right foot.  No pain with calf compression.   Post Op Xray, right foot: Second toe in good alignment with K wire intact previous hallux screws intact mild soft tissue swelling consistent with postoperative status no other acute  findings.  Assessment and Plan:  Problem List Items Addressed This Visit    None    Visit Diagnoses    Post-operative state    -  Primary   Hammertoe of right foot           -Patient seen and evaluated -X-rays reviewed -Applied dry sterile dressing to surgical site right foot secured with ACE wrap and stockinet  -Advised patient to make sure to keep dressings clean, dry, and intact to right surgical site -Advised patient to continue with cam boot and protection of the pin site toe and to avoid from excessive standing and walking -Advised patient to continue with PRN meds at bedtime and with antibiotics as prescribed by Dr. March Rummage -Will plan for continued postoperative care with Dr. March Rummage at next office visit. In the meantime, patient to call office if any issues or problems arise.   Landis Martins, DPM

## 2019-06-08 ENCOUNTER — Encounter: Payer: Self-pay | Admitting: Podiatry

## 2019-06-08 ENCOUNTER — Other Ambulatory Visit: Payer: Self-pay

## 2019-06-08 ENCOUNTER — Ambulatory Visit (INDEPENDENT_AMBULATORY_CARE_PROVIDER_SITE_OTHER): Payer: Medicare Other | Admitting: Podiatry

## 2019-06-08 VITALS — Temp 97.9°F | Resp 16

## 2019-06-08 DIAGNOSIS — Z9889 Other specified postprocedural states: Secondary | ICD-10-CM

## 2019-06-08 NOTE — Progress Notes (Signed)
Subjective:  Patient ID: Pamela Schwartz, female    DOB: 04/25/1941,  MRN: [id number]  Chief Complaint  Patient presents with  . Routine Post Op    pov#2 dos 7.15.2020 Removal Fixation of Screw Rt, IPJ Dislocation Rt -pt states," it's been doing good, every  so often I have to take the boot off it feels tight; 1/10." Tx: boot and elevation    DOS: 05/27/2019 Procedure: Removal of hardware right 2nd toe, repair of IPJ dislocation  78 y.o. female returns for post-op check. Hx as above.  Review of Systems: Negative except as noted in the HPI. Denies N/V/F/Ch.  Past Medical History:  Diagnosis Date  . Arthritis   . Cancer [facility])    denies  . Chronic kidney disease   . GERD (gastroesophageal reflux disease)   . Hearing loss   . Hypercholesterolemia   . Hypertension   . IBS (irritable bowel syndrome)   . Scars     Current Outpatient Medications:  .  amLODipine (NORVASC) 10 MG tablet, Take 10 mg by mouth daily., Disp: , Rfl: 1 .  aspirin 81 MG tablet, Take 81 mg by mouth daily., Disp: , Rfl:  .  cephALEXin (KEFLEX) 500 MG capsule, Take 1 capsule (500 mg total) by mouth 2 (two) times daily., Disp: 14 capsule, Rfl: 0 .  chlorthalidone (HYGROTON) 25 MG tablet, Take 25 mg by mouth daily., Disp: , Rfl:  .  cyclobenzaprine (FLEXERIL) 10 MG tablet, Take 1 tablet (10 mg total) by mouth 3 (three) times daily as needed for muscle spasms., Disp: 30 tablet, Rfl: 0 .  dicyclomine (BENTYL) 20 MG tablet, Take 20 mg by mouth 4 (four) times daily -  before meals and at bedtime., Disp: , Rfl: 1 .  gemfibrozil (LOPID) 600 MG tablet, Take 600 mg by mouth 2 (two) times daily before a meal., Disp: , Rfl:  .  metoprolol (TOPROL-XL) 200 MG 24 hr tablet, Take 300 mg by mouth daily., Disp: , Rfl:  .  Multiple Vitamins-Minerals (PRESERVISION AREDS PO), Take 1 tablet by mouth 2 (two) times daily., Disp: , Rfl:  .  omega-3 acid ethyl esters (LOVAZA) 1 g capsule, Take 2 capsules by mouth 2 (two) times daily., Disp:  , Rfl:  .  oxyCODONE-acetaminophen (PERCOCET) 5-325 MG tablet, Take 1 tablet by mouth every 4 (four) hours as needed for severe pain., Disp: 20 tablet, Rfl: 0 .  pantoprazole (PROTONIX) 40 MG tablet, Take 40 mg by mouth daily., Disp: , Rfl: 1 .  pravastatin (PRAVACHOL) 20 MG tablet, Take 20 mg by mouth at bedtime., Disp: , Rfl: 1 .  promethazine (PHENERGAN) 25 MG tablet, take 1 tablet (25 mg) by oral route 4 times per day prn nausea/vomiting, Disp: , Rfl:  .  traMADol (ULTRAM) 50 MG tablet, Take 50 mg by mouth every 8 (eight) hours as needed for moderate pain. , Disp: , Rfl: 2 .  Vitamin D, Ergocalciferol, (DRISDOL) 50000 units CAPS capsule, Take 50,000 Units by mouth 2 (two) times daily. Monday and Thursday, Disp: , Rfl: 1 .  zolpidem (AMBIEN CR) 12.5 MG CR tablet, Take 12.5 mg by mouth at bedtime., Disp: , Rfl: 1  Social History   Tobacco Use  Smoking Status Former Smoker  . Packs/day: 0.50  . Types: Cigarettes  . Quit date: 10/02/2015  . Years since quitting: 3.6  Smokeless Tobacco Never Used    Allergies  Allergen Reactions  . Nickel   . Tape Other (See Comments)    adhesive  adhesive   Objective:   Vitals:   06/08/19 1057  Resp: 16  Temp: 97.9 F (36.6 C)   There is no height or weight on file to calculate BMI. Constitutional Well developed. Well nourished.  Vascular Foot warm and well perfused. Capillary refill normal to all digits.   Neurologic Normal speech. Oriented to person, place, and time. Epicritic sensation to light touch grossly present bilaterally.  Dermatologic Skin healing well without signs of infection. Skin edges well coapted without signs of infection.  Orthopedic: No tenderness to palpation noted about the surgical site. Pin intact, no signs of infection. 2nd toe rectus.   Radiographs: None today. Assessment:   1. Post-operative state    Plan:  Patient was evaluated and treated and all questions answered.  S/p foot surgery right  -Progressing as expected post-operatively. -XR: None -WB Status: WBAT in CAM boot -Sutures: removed. -Medications: none refilled. -Foot redressed.  No follow-ups on file.  XR at next visit. Plan to transition to surgical shoe.

## 2019-06-12 ENCOUNTER — Other Ambulatory Visit: Payer: Self-pay | Admitting: Sports Medicine

## 2019-06-12 DIAGNOSIS — M2041 Other hammer toe(s) (acquired), right foot: Secondary | ICD-10-CM

## 2019-06-12 DIAGNOSIS — Z9889 Other specified postprocedural states: Secondary | ICD-10-CM

## 2019-06-22 ENCOUNTER — Ambulatory Visit (INDEPENDENT_AMBULATORY_CARE_PROVIDER_SITE_OTHER): Payer: Medicare Other

## 2019-06-22 ENCOUNTER — Other Ambulatory Visit: Payer: Self-pay

## 2019-06-22 ENCOUNTER — Other Ambulatory Visit: Payer: Self-pay | Admitting: Podiatry

## 2019-06-22 ENCOUNTER — Ambulatory Visit (INDEPENDENT_AMBULATORY_CARE_PROVIDER_SITE_OTHER): Payer: Medicare Other | Admitting: Podiatry

## 2019-06-22 ENCOUNTER — Encounter: Payer: Self-pay | Admitting: Podiatry

## 2019-06-22 VITALS — Temp 97.3°F | Resp 16

## 2019-06-22 DIAGNOSIS — Z9889 Other specified postprocedural states: Secondary | ICD-10-CM

## 2019-06-22 DIAGNOSIS — M2041 Other hammer toe(s) (acquired), right foot: Secondary | ICD-10-CM

## 2019-06-22 NOTE — Progress Notes (Signed)
Subjective:  Patient ID: Pamela Schwartz, female    DOB: 05/14/1941,  MRN: [id number]  Chief Complaint  Patient presents with  . Routine Post Op    Pt. states," it's okay, a little bit sore; 1/*2 sorness." Tx: boot -pt dneies N/v/F/Ch    DOS: 05/27/2019 Procedure: Removal of hardware right 2nd toe, repair of IPJ dislocation  78 y.o. female returns for post-op check. Hx as above. Doing well not having much pain.  Review of Systems: Negative except as noted in the HPI. Denies N/V/F/Ch.  Past Medical History:  Diagnosis Date  . Arthritis   . Cancer [facility])    denies  . Chronic kidney disease   . GERD (gastroesophageal reflux disease)   . Hearing loss   . Hypercholesterolemia   . Hypertension   . IBS (irritable bowel syndrome)   . Scars     Current Outpatient Medications:  .  amLODipine (NORVASC) 10 MG tablet, Take 10 mg by mouth daily., Disp: , Rfl: 1 .  aspirin 81 MG tablet, Take 81 mg by mouth daily., Disp: , Rfl:  .  cephALEXin (KEFLEX) 500 MG capsule, Take 1 capsule (500 mg total) by mouth 2 (two) times daily., Disp: 14 capsule, Rfl: 0 .  chlorthalidone (HYGROTON) 25 MG tablet, Take 25 mg by mouth daily., Disp: , Rfl:  .  cyclobenzaprine (FLEXERIL) 10 MG tablet, Take 1 tablet (10 mg total) by mouth 3 (three) times daily as needed for muscle spasms., Disp: 30 tablet, Rfl: 0 .  dicyclomine (BENTYL) 20 MG tablet, Take 20 mg by mouth 4 (four) times daily -  before meals and at bedtime., Disp: , Rfl: 1 .  gemfibrozil (LOPID) 600 MG tablet, Take 600 mg by mouth 2 (two) times daily before a meal., Disp: , Rfl:  .  metoprolol (TOPROL-XL) 200 MG 24 hr tablet, Take 300 mg by mouth daily., Disp: , Rfl:  .  Multiple Vitamins-Minerals (PRESERVISION AREDS PO), Take 1 tablet by mouth 2 (two) times daily., Disp: , Rfl:  .  omega-3 acid ethyl esters (LOVAZA) 1 g capsule, Take 2 capsules by mouth 2 (two) times daily., Disp: , Rfl:  .  oxyCODONE-acetaminophen (PERCOCET) 5-325 MG tablet, Take 1  tablet by mouth every 4 (four) hours as needed for severe pain., Disp: 20 tablet, Rfl: 0 .  pantoprazole (PROTONIX) 40 MG tablet, Take 40 mg by mouth daily., Disp: , Rfl: 1 .  pravastatin (PRAVACHOL) 20 MG tablet, Take 20 mg by mouth at bedtime., Disp: , Rfl: 1 .  promethazine (PHENERGAN) 25 MG tablet, take 1 tablet (25 mg) by oral route 4 times per day prn nausea/vomiting, Disp: , Rfl:  .  traMADol (ULTRAM) 50 MG tablet, Take 50 mg by mouth every 8 (eight) hours as needed for moderate pain. , Disp: , Rfl: 2 .  Vitamin D, Ergocalciferol, (DRISDOL) 50000 units CAPS capsule, Take 50,000 Units by mouth 2 (two) times daily. Monday and Thursday, Disp: , Rfl: 1 .  zolpidem (AMBIEN CR) 12.5 MG CR tablet, Take 12.5 mg by mouth at bedtime., Disp: , Rfl: 1  Social History   Tobacco Use  Smoking Status Former Smoker  . Packs/day: 0.50  . Types: Cigarettes  . Quit date: 10/02/2015  . Years since quitting: 3.7  Smokeless Tobacco Never Used    Allergies  Allergen Reactions  . Nickel   . Tape Other (See Comments)    adhesive adhesive   Objective:   Vitals:   06/22/19 1527  Resp: 16  Temp: (!) 97.3 F (36.3 C)   There is no height or weight on file to calculate BMI. Constitutional Well developed. Well nourished.  Vascular Foot warm and well perfused. Capillary refill normal to all digits.   Neurologic Normal speech. Oriented to person, place, and time. Epicritic sensation to light touch grossly present bilaterally.  Dermatologic Skin well healed.  Orthopedic: No tenderness to palpation noted about the surgical site. Pin intact, no signs of infection. 2nd toe rectus.   Radiographs: Taken and reviewed no interval changes no evidence of pin backout or failure Assessment:   1. Post-operative state   2. Hammertoe of right foot    Plan:  Patient was evaluated and treated and all questions answered.  S/p foot surgery right -Progressing as expected post-operatively. -XR: None -WB  Status: WBAT in CAM boot -Skin healed. Pin site without signs of infection. -Medications: none refilled. -Foot redressed.  No follow-ups on file.  Continue boot immobilization.

## 2019-06-25 ENCOUNTER — Other Ambulatory Visit: Payer: Self-pay | Admitting: Podiatry

## 2019-06-25 DIAGNOSIS — M2041 Other hammer toe(s) (acquired), right foot: Secondary | ICD-10-CM

## 2019-06-25 DIAGNOSIS — Z9889 Other specified postprocedural states: Secondary | ICD-10-CM

## 2019-07-06 ENCOUNTER — Other Ambulatory Visit: Payer: Self-pay

## 2019-07-06 ENCOUNTER — Ambulatory Visit (INDEPENDENT_AMBULATORY_CARE_PROVIDER_SITE_OTHER): Payer: Medicare Other

## 2019-07-06 ENCOUNTER — Encounter: Payer: Self-pay | Admitting: Podiatry

## 2019-07-06 ENCOUNTER — Ambulatory Visit (INDEPENDENT_AMBULATORY_CARE_PROVIDER_SITE_OTHER): Payer: Medicare Other | Admitting: Podiatry

## 2019-07-06 VITALS — Temp 97.0°F | Resp 16

## 2019-07-06 DIAGNOSIS — M2011 Hallux valgus (acquired), right foot: Secondary | ICD-10-CM

## 2019-07-06 DIAGNOSIS — M2041 Other hammer toe(s) (acquired), right foot: Secondary | ICD-10-CM

## 2019-07-06 DIAGNOSIS — Z9889 Other specified postprocedural states: Secondary | ICD-10-CM | POA: Diagnosis not present

## 2019-07-06 NOTE — Progress Notes (Signed)
Subjective:  Patient ID: Pamela Schwartz, female    DOB: 11-23-1940,  MRN: [id number]  Chief Complaint  Patient presents with  . Routine Post Op    POV -Pt. states," little sorenss every now and then." Tx: cam boot -pt denies V.F/CH     DOS: 05/27/2019 Procedure: Removal of hardware right 2nd toe, repair of IPJ dislocation  78 y.o. female returns for post-op check. Hx as above. Reports occassional soreness. Denies other issues.  Review of Systems: Negative except as noted in the HPI. Denies N/V/F/Ch.  Past Medical History:  Diagnosis Date  . Arthritis   . Cancer [facility])    denies  . Chronic kidney disease   . GERD (gastroesophageal reflux disease)   . Hearing loss   . Hypercholesterolemia   . Hypertension   . IBS (irritable bowel syndrome)   . Scars     Current Outpatient Medications:  .  amLODipine (NORVASC) 10 MG tablet, Take 10 mg by mouth daily., Disp: , Rfl: 1 .  aspirin 81 MG tablet, Take 81 mg by mouth daily., Disp: , Rfl:  .  cephALEXin (KEFLEX) 500 MG capsule, Take 1 capsule (500 mg total) by mouth 2 (two) times daily., Disp: 14 capsule, Rfl: 0 .  chlorthalidone (HYGROTON) 25 MG tablet, Take 25 mg by mouth daily., Disp: , Rfl:  .  cyclobenzaprine (FLEXERIL) 10 MG tablet, Take 1 tablet (10 mg total) by mouth 3 (three) times daily as needed for muscle spasms., Disp: 30 tablet, Rfl: 0 .  dicyclomine (BENTYL) 20 MG tablet, Take 20 mg by mouth 4 (four) times daily -  before meals and at bedtime., Disp: , Rfl: 1 .  gemfibrozil (LOPID) 600 MG tablet, Take 600 mg by mouth 2 (two) times daily before a meal., Disp: , Rfl:  .  metoprolol (TOPROL-XL) 200 MG 24 hr tablet, Take 300 mg by mouth daily., Disp: , Rfl:  .  Multiple Vitamins-Minerals (PRESERVISION AREDS PO), Take 1 tablet by mouth 2 (two) times daily., Disp: , Rfl:  .  omega-3 acid ethyl esters (LOVAZA) 1 g capsule, Take 2 capsules by mouth 2 (two) times daily., Disp: , Rfl:  .  oxyCODONE-acetaminophen (PERCOCET) 5-325 MG  tablet, Take 1 tablet by mouth every 4 (four) hours as needed for severe pain., Disp: 20 tablet, Rfl: 0 .  pantoprazole (PROTONIX) 40 MG tablet, Take 40 mg by mouth daily., Disp: , Rfl: 1 .  pravastatin (PRAVACHOL) 20 MG tablet, Take 20 mg by mouth at bedtime., Disp: , Rfl: 1 .  promethazine (PHENERGAN) 25 MG tablet, take 1 tablet (25 mg) by oral route 4 times per day prn nausea/vomiting, Disp: , Rfl:  .  traMADol (ULTRAM) 50 MG tablet, Take 50 mg by mouth every 8 (eight) hours as needed for moderate pain. , Disp: , Rfl: 2 .  Vitamin D, Ergocalciferol, (DRISDOL) 50000 units CAPS capsule, Take 50,000 Units by mouth 2 (two) times daily. Monday and Thursday, Disp: , Rfl: 1 .  zolpidem (AMBIEN CR) 12.5 MG CR tablet, Take 12.5 mg by mouth at bedtime., Disp: , Rfl: 1  Social History   Tobacco Use  Smoking Status Former Smoker  . Packs/day: 0.50  . Types: Cigarettes  . Quit date: 10/02/2015  . Years since quitting: 3.7  Smokeless Tobacco Never Used    Allergies  Allergen Reactions  . Nickel   . Tape Other (See Comments)    adhesive adhesive   Objective:   Vitals:   07/06/19 0939  Resp: 16  Temp: (!) 97 F (36.1 C)   There is no height or weight on file to calculate BMI. Constitutional Well developed. Well nourished.  Vascular Foot warm and well perfused. Capillary refill normal to all digits.   Neurologic Normal speech. Oriented to person, place, and time. Epicritic sensation to light touch grossly present bilaterally.  Dermatologic Skin well healed. Pin site intact without signs of infection.  Orthopedic: No tenderness to palpation noted about the surgical site. Pin intact, no signs of infection. 2nd toe rectus.   Radiographs: Taken and reviewed no interval changes no evidence of pin backout or failure Assessment:   1. Post-operative state   2. Hammertoe of right foot   3. Hallux interphalangeus, acquired, right    Plan:  Patient was evaluated and treated and all  questions answered.  S/p foot surgery right -Progressing as expected post-operatively. -Pin pulled. -XR: taken and reviewed good digital alignment no definite osseous bridging. -WB Status: WBAT in surgical shoe. Shoe dispensed. -Medications: none refilled. -Foot redressed. -F/u in 3 weeks to transition out of surgical shoe.  No follow-ups on file.

## 2019-07-24 DIAGNOSIS — E782 Mixed hyperlipidemia: Secondary | ICD-10-CM | POA: Diagnosis not present

## 2019-07-24 DIAGNOSIS — K580 Irritable bowel syndrome with diarrhea: Secondary | ICD-10-CM | POA: Diagnosis not present

## 2019-07-24 DIAGNOSIS — R7301 Impaired fasting glucose: Secondary | ICD-10-CM | POA: Diagnosis not present

## 2019-07-24 DIAGNOSIS — K219 Gastro-esophageal reflux disease without esophagitis: Secondary | ICD-10-CM | POA: Diagnosis not present

## 2019-07-24 DIAGNOSIS — I10 Essential (primary) hypertension: Secondary | ICD-10-CM | POA: Diagnosis not present

## 2019-07-24 DIAGNOSIS — F5101 Primary insomnia: Secondary | ICD-10-CM | POA: Diagnosis not present

## 2019-07-24 DIAGNOSIS — M545 Low back pain: Secondary | ICD-10-CM | POA: Diagnosis not present

## 2019-07-24 DIAGNOSIS — Z23 Encounter for immunization: Secondary | ICD-10-CM | POA: Diagnosis not present

## 2019-07-24 DIAGNOSIS — I129 Hypertensive chronic kidney disease with stage 1 through stage 4 chronic kidney disease, or unspecified chronic kidney disease: Secondary | ICD-10-CM | POA: Diagnosis not present

## 2019-07-24 DIAGNOSIS — Z122 Encounter for screening for malignant neoplasm of respiratory organs: Secondary | ICD-10-CM | POA: Diagnosis not present

## 2019-08-04 DIAGNOSIS — Z87891 Personal history of nicotine dependence: Secondary | ICD-10-CM | POA: Diagnosis not present

## 2019-08-10 ENCOUNTER — Other Ambulatory Visit: Payer: Self-pay

## 2019-08-10 ENCOUNTER — Ambulatory Visit (INDEPENDENT_AMBULATORY_CARE_PROVIDER_SITE_OTHER): Payer: Medicare Other | Admitting: Podiatry

## 2019-08-10 ENCOUNTER — Ambulatory Visit (INDEPENDENT_AMBULATORY_CARE_PROVIDER_SITE_OTHER): Payer: Medicare Other

## 2019-08-10 DIAGNOSIS — Z9889 Other specified postprocedural states: Secondary | ICD-10-CM

## 2019-08-10 DIAGNOSIS — M2041 Other hammer toe(s) (acquired), right foot: Secondary | ICD-10-CM | POA: Diagnosis not present

## 2019-08-10 DIAGNOSIS — M722 Plantar fascial fibromatosis: Secondary | ICD-10-CM

## 2019-08-10 NOTE — Progress Notes (Signed)
Subjective:  Patient ID: Pamela Schwartz, female    DOB: March 05, 1941,  MRN: [id number]  Chief Complaint  Patient presents with  . Routine Post Op    Pt. states," a little sore on the top when I walk; 2/10 sorness at the end of the day." Tx: noen -pt denies swellign -pt states her 2nd toe still rubbs with th e1st toe    DOS: 05/27/2019 Procedure: Removal of hardware right 2nd toe, repair of IPJ dislocation  78 y.o. female returns for post-op check. Hx as above. Reports occassional soreness. Denies other issues.  Review of Systems: Negative except as noted in the HPI. Denies N/V/F/Ch.  Past Medical History:  Diagnosis Date  . Arthritis   . Cancer [facility])    denies  . Chronic kidney disease   . GERD (gastroesophageal reflux disease)   . Hearing loss   . Hypercholesterolemia   . Hypertension   . IBS (irritable bowel syndrome)   . Scars     Current Outpatient Medications:  .  amLODipine (NORVASC) 10 MG tablet, Take 10 mg by mouth daily., Disp: , Rfl: 1 .  aspirin 81 MG tablet, Take 81 mg by mouth daily., Disp: , Rfl:  .  cephALEXin (KEFLEX) 500 MG capsule, Take 1 capsule (500 mg total) by mouth 2 (two) times daily., Disp: 14 capsule, Rfl: 0 .  chlorthalidone (HYGROTON) 25 MG tablet, Take 25 mg by mouth daily., Disp: , Rfl:  .  cyclobenzaprine (FLEXERIL) 10 MG tablet, Take 1 tablet (10 mg total) by mouth 3 (three) times daily as needed for muscle spasms., Disp: 30 tablet, Rfl: 0 .  dicyclomine (BENTYL) 20 MG tablet, Take 20 mg by mouth 4 (four) times daily -  before meals and at bedtime., Disp: , Rfl: 1 .  gemfibrozil (LOPID) 600 MG tablet, Take 600 mg by mouth 2 (two) times daily before a meal., Disp: , Rfl:  .  metoprolol (TOPROL-XL) 200 MG 24 hr tablet, Take 300 mg by mouth daily., Disp: , Rfl:  .  Multiple Vitamins-Minerals (PRESERVISION AREDS PO), Take 1 tablet by mouth 2 (two) times daily., Disp: , Rfl:  .  omega-3 acid ethyl esters (LOVAZA) 1 g capsule, Take 2 capsules by mouth 2  (two) times daily., Disp: , Rfl:  .  oxyCODONE-acetaminophen (PERCOCET) 5-325 MG tablet, Take 1 tablet by mouth every 4 (four) hours as needed for severe pain., Disp: 20 tablet, Rfl: 0 .  pantoprazole (PROTONIX) 40 MG tablet, Take 40 mg by mouth daily., Disp: , Rfl: 1 .  pravastatin (PRAVACHOL) 20 MG tablet, Take 20 mg by mouth at bedtime., Disp: , Rfl: 1 .  promethazine (PHENERGAN) 25 MG tablet, take 1 tablet (25 mg) by oral route 4 times per day prn nausea/vomiting, Disp: , Rfl:  .  traMADol (ULTRAM) 50 MG tablet, Take 50 mg by mouth every 8 (eight) hours as needed for moderate pain. , Disp: , Rfl: 2 .  Vitamin D, Ergocalciferol, (DRISDOL) 50000 units CAPS capsule, Take 50,000 Units by mouth 2 (two) times daily. Monday and Thursday, Disp: , Rfl: 1 .  zolpidem (AMBIEN CR) 12.5 MG CR tablet, Take 12.5 mg by mouth at bedtime., Disp: , Rfl: 1  Social History   Tobacco Use  Smoking Status Former Smoker  . Packs/day: 0.50  . Types: Cigarettes  . Quit date: 10/02/2015  . Years since quitting: 3.8  Smokeless Tobacco Never Used    Allergies  Allergen Reactions  . Nickel   . Tape Other (See  Comments)    adhesive adhesive   Objective:   There were no vitals filed for this visit. There is no height or weight on file to calculate BMI. Constitutional Well developed. Well nourished.  Vascular Foot warm and well perfused. Capillary refill normal to all digits.   Neurologic Normal speech. Oriented to person, place, and time. Epicritic sensation to light touch grossly present bilaterally.  Dermatologic Skin well healed  Orthopedic: Mild tenderness to palpation at the medial 2nd PIPJ. Toe overall rectus. Pain to palpation at the medial arch right foot.   Radiographs: Taken and reviewed PIPJ defomity at the second toe without osseous bridging. Assessment:   1. Post-operative state   2. Hammertoe of right foot   3. Plantar fasciitis    Plan:  Patient was evaluated and treated and all  questions answered.  S/p foot surgery right -Progressing as expected post-operatively. -XR: taken and reviewed. -Discussed that the toe does seem to have some residual deformity. Discussed purpose of original screw was to prevent this however patient walked on the area pre-maturely causing screw backout. -Will dispense toe alignment splint -Dispense hammertoe crest pad.  Plantar fasciitis right -Dispense PF brace  Return in about 4 weeks (around 09/07/2019) for Post-op, Right.

## 2019-09-07 ENCOUNTER — Other Ambulatory Visit: Payer: Self-pay

## 2019-09-07 ENCOUNTER — Ambulatory Visit (INDEPENDENT_AMBULATORY_CARE_PROVIDER_SITE_OTHER): Payer: Medicare Other | Admitting: Podiatry

## 2019-09-07 DIAGNOSIS — M2041 Other hammer toe(s) (acquired), right foot: Secondary | ICD-10-CM | POA: Diagnosis not present

## 2019-09-07 DIAGNOSIS — M722 Plantar fascial fibromatosis: Secondary | ICD-10-CM

## 2019-09-07 NOTE — Progress Notes (Signed)
Subjective:  Patient ID: Pamela Schwartz, female    DOB: 08-27-1941,  MRN: [id number]  Chief Complaint  Patient presents with  . Routine Post Op    Pt. states," it's okay. After walking all day it starts to feel raw. The pain is better, I don't pay any attention ot that now." -pt dneies swelling/redness Tx: ROM     DOS: 05/27/2019 Procedure: Removal of hardware right 2nd toe, repair of IPJ dislocation  78 y.o. female returns for post-op check. Hx as above.  Toe is feeling better but the pain in her heel is worse  Review of Systems: Negative except as noted in the HPI. Denies N/V/F/Ch.  Past Medical History:  Diagnosis Date  . Arthritis   . Cancer [facility])    denies  . Chronic kidney disease   . GERD (gastroesophageal reflux disease)   . Hearing loss   . Hypercholesterolemia   . Hypertension   . IBS (irritable bowel syndrome)   . Scars     Current Outpatient Medications:  .  amLODipine (NORVASC) 10 MG tablet, Take 10 mg by mouth daily., Disp: , Rfl: 1 .  aspirin 81 MG tablet, Take 81 mg by mouth daily., Disp: , Rfl:  .  cephALEXin (KEFLEX) 500 MG capsule, Take 1 capsule (500 mg total) by mouth 2 (two) times daily., Disp: 14 capsule, Rfl: 0 .  chlorthalidone (HYGROTON) 25 MG tablet, Take 25 mg by mouth daily., Disp: , Rfl:  .  cyclobenzaprine (FLEXERIL) 10 MG tablet, Take 1 tablet (10 mg total) by mouth 3 (three) times daily as needed for muscle spasms., Disp: 30 tablet, Rfl: 0 .  dicyclomine (BENTYL) 20 MG tablet, Take 20 mg by mouth 4 (four) times daily -  before meals and at bedtime., Disp: , Rfl: 1 .  gemfibrozil (LOPID) 600 MG tablet, Take 600 mg by mouth 2 (two) times daily before a meal., Disp: , Rfl:  .  metoprolol (TOPROL-XL) 200 MG 24 hr tablet, Take 300 mg by mouth daily., Disp: , Rfl:  .  Multiple Vitamins-Minerals (PRESERVISION AREDS PO), Take 1 tablet by mouth 2 (two) times daily., Disp: , Rfl:  .  omega-3 acid ethyl esters (LOVAZA) 1 g capsule, Take 2 capsules by  mouth 2 (two) times daily., Disp: , Rfl:  .  oxyCODONE-acetaminophen (PERCOCET) 5-325 MG tablet, Take 1 tablet by mouth every 4 (four) hours as needed for severe pain., Disp: 20 tablet, Rfl: 0 .  pantoprazole (PROTONIX) 40 MG tablet, Take 40 mg by mouth daily., Disp: , Rfl: 1 .  pravastatin (PRAVACHOL) 20 MG tablet, Take 20 mg by mouth at bedtime., Disp: , Rfl: 1 .  promethazine (PHENERGAN) 25 MG tablet, take 1 tablet (25 mg) by oral route 4 times per day prn nausea/vomiting, Disp: , Rfl:  .  traMADol (ULTRAM) 50 MG tablet, Take 50 mg by mouth every 8 (eight) hours as needed for moderate pain. , Disp: , Rfl: 2 .  Vitamin D, Ergocalciferol, (DRISDOL) 50000 units CAPS capsule, Take 50,000 Units by mouth 2 (two) times daily. Monday and Thursday, Disp: , Rfl: 1 .  zolpidem (AMBIEN CR) 12.5 MG CR tablet, Take 12.5 mg by mouth at bedtime., Disp: , Rfl: 1  Social History   Tobacco Use  Smoking Status Former Smoker  . Packs/day: 0.50  . Types: Cigarettes  . Quit date: 10/02/2015  . Years since quitting: 3.9  Smokeless Tobacco Never Used    Allergies  Allergen Reactions  . Nickel   .  Tape Other (See Comments)    adhesive adhesive   Objective:   There were no vitals filed for this visit. There is no height or weight on file to calculate BMI. Constitutional Well developed. Well nourished.  Vascular Foot warm and well perfused. Capillary refill normal to all digits.   Neurologic Normal speech. Oriented to person, place, and time. Epicritic sensation to light touch grossly present bilaterally.  Dermatologic Skin well healed  Orthopedic: Mild tenderness to palpation at the medial 2nd PIPJ. Toe overall rectus. Pain to palpation at the medial arch right foot.   Radiographs: None Assessment:   1. Plantar fasciitis   2. Hammertoe of right foot    Plan:  Patient was evaluated and treated and all questions answered.  S/p foot surgery right -Dispensed toe alignment splint and educated  on use.  Plantar fasciitis right -Injection as below  Procedure: Injection Tendon/Ligament Consent: Verbal consent obtained. Location: Right plantar fascia at the glabrous junction; medial approach. Skin Prep: Alcohol. Injectate: 1 cc 0.5% marcaine plain, 1 cc dexamethasone phosphate, 0.5 cc kenalog 10. Disposition: Patient tolerated procedure well. Injection site dressed with a band-aid.     No follow-ups on file.

## 2019-09-29 DIAGNOSIS — Z1231 Encounter for screening mammogram for malignant neoplasm of breast: Secondary | ICD-10-CM | POA: Diagnosis not present

## 2019-09-29 DIAGNOSIS — Z0001 Encounter for general adult medical examination with abnormal findings: Secondary | ICD-10-CM | POA: Diagnosis not present

## 2019-09-29 DIAGNOSIS — R202 Paresthesia of skin: Secondary | ICD-10-CM | POA: Diagnosis not present

## 2019-09-29 DIAGNOSIS — Z6826 Body mass index (BMI) 26.0-26.9, adult: Secondary | ICD-10-CM | POA: Diagnosis not present

## 2019-10-19 ENCOUNTER — Other Ambulatory Visit: Payer: Self-pay

## 2019-10-19 ENCOUNTER — Ambulatory Visit (INDEPENDENT_AMBULATORY_CARE_PROVIDER_SITE_OTHER): Payer: Medicare Other | Admitting: Podiatry

## 2019-10-19 DIAGNOSIS — M722 Plantar fascial fibromatosis: Secondary | ICD-10-CM | POA: Diagnosis not present

## 2019-10-19 NOTE — Progress Notes (Signed)
Subjective:  Patient ID: Pamela Schwartz, female    DOB: 1941-09-03,  MRN: [id number]  Chief Complaint  Patient presents with  . Plantar Fasciitis    F/U POV and Rt fasciitis Pt. states," bottom heel is better. It hurst if I tryo to walk barefooted, if I wear a shoe it's okay.. The sx to eis doing pretty good, I think that's the bes it will be." tx: toe splint    DOS: 05/27/2019 Procedure: Removal of hardware right 2nd toe, repair of IPJ dislocation  78 y.o. female returns for post-op check. Hx as above.   Review of Systems: Negative except as noted in the HPI. Denies N/V/F/Ch.  Past Medical History:  Diagnosis Date  . Arthritis   . Cancer [facility])    denies  . Chronic kidney disease   . GERD (gastroesophageal reflux disease)   . Hearing loss   . Hypercholesterolemia   . Hypertension   . IBS (irritable bowel syndrome)   . Scars     Current Outpatient Medications:  .  amLODipine (NORVASC) 10 MG tablet, Take 10 mg by mouth daily., Disp: , Rfl: 1 .  aspirin 81 MG tablet, Take 81 mg by mouth daily., Disp: , Rfl:  .  cephALEXin (KEFLEX) 500 MG capsule, Take 1 capsule (500 mg total) by mouth 2 (two) times daily., Disp: 14 capsule, Rfl: 0 .  chlorthalidone (HYGROTON) 25 MG tablet, Take 25 mg by mouth daily., Disp: , Rfl:  .  cyclobenzaprine (FLEXERIL) 10 MG tablet, Take 1 tablet (10 mg total) by mouth 3 (three) times daily as needed for muscle spasms., Disp: 30 tablet, Rfl: 0 .  dicyclomine (BENTYL) 20 MG tablet, Take 20 mg by mouth 4 (four) times daily -  before meals and at bedtime., Disp: , Rfl: 1 .  gemfibrozil (LOPID) 600 MG tablet, Take 600 mg by mouth 2 (two) times daily before a meal., Disp: , Rfl:  .  metoprolol (TOPROL-XL) 200 MG 24 hr tablet, Take 300 mg by mouth daily., Disp: , Rfl:  .  Multiple Vitamins-Minerals (PRESERVISION AREDS PO), Take 1 tablet by mouth 2 (two) times daily., Disp: , Rfl:  .  omega-3 acid ethyl esters (LOVAZA) 1 g capsule, Take 2 capsules by mouth 2  (two) times daily., Disp: , Rfl:  .  oxyCODONE-acetaminophen (PERCOCET) 5-325 MG tablet, Take 1 tablet by mouth every 4 (four) hours as needed for severe pain., Disp: 20 tablet, Rfl: 0 .  pantoprazole (PROTONIX) 40 MG tablet, Take 40 mg by mouth daily., Disp: , Rfl: 1 .  pravastatin (PRAVACHOL) 20 MG tablet, Take 20 mg by mouth at bedtime., Disp: , Rfl: 1 .  promethazine (PHENERGAN) 25 MG tablet, take 1 tablet (25 mg) by oral route 4 times per day prn nausea/vomiting, Disp: , Rfl:  .  traMADol (ULTRAM) 50 MG tablet, Take 50 mg by mouth every 8 (eight) hours as needed for moderate pain. , Disp: , Rfl: 2 .  Vitamin D, Ergocalciferol, (DRISDOL) 50000 units CAPS capsule, Take 50,000 Units by mouth 2 (two) times daily. Monday and Thursday, Disp: , Rfl: 1 .  zolpidem (AMBIEN CR) 12.5 MG CR tablet, Take 12.5 mg by mouth at bedtime., Disp: , Rfl: 1  Social History   Tobacco Use  Smoking Status Former Smoker  . Packs/day: 0.50  . Types: Cigarettes  . Quit date: 10/02/2015  . Years since quitting: 4.0  Smokeless Tobacco Never Used    Allergies  Allergen Reactions  . Nickel   .  Tape Other (See Comments)    adhesive adhesive   Objective:   There were no vitals filed for this visit. There is no height or weight on file to calculate BMI. Constitutional Well developed. Well nourished.  Vascular Foot warm and well perfused. Capillary refill normal to all digits.   Neurologic Normal speech. Oriented to person, place, and time. Epicritic sensation to light touch grossly present bilaterally.  Dermatologic Skin well healed  Orthopedic: no tenderness to palpation at the medial 2nd PIPJ. Toe overall rectus. No pain to palpation at the medial arch right foot.   Radiographs: None Assessment:   1. Plantar fasciitis    Plan:  Patient was evaluated and treated and all questions answered.  S/p foot surgery right -Doing very well denies postop issues or concerns  Plantar fasciitis right  -Improving no pain today.  No injection  No follow-ups on file.

## 2019-10-19 NOTE — Patient Instructions (Signed)

## 2019-11-03 DIAGNOSIS — U071 COVID-19: Secondary | ICD-10-CM | POA: Diagnosis not present

## 2019-11-03 DIAGNOSIS — J208 Acute bronchitis due to other specified organisms: Secondary | ICD-10-CM | POA: Diagnosis not present

## 2019-11-24 DIAGNOSIS — M545 Low back pain: Secondary | ICD-10-CM | POA: Diagnosis not present

## 2019-11-24 DIAGNOSIS — K219 Gastro-esophageal reflux disease without esophagitis: Secondary | ICD-10-CM | POA: Diagnosis not present

## 2019-11-24 DIAGNOSIS — K580 Irritable bowel syndrome with diarrhea: Secondary | ICD-10-CM | POA: Diagnosis not present

## 2019-11-24 DIAGNOSIS — E782 Mixed hyperlipidemia: Secondary | ICD-10-CM | POA: Diagnosis not present

## 2019-11-24 DIAGNOSIS — U071 COVID-19: Secondary | ICD-10-CM | POA: Diagnosis not present

## 2019-11-24 DIAGNOSIS — R7301 Impaired fasting glucose: Secondary | ICD-10-CM | POA: Diagnosis not present

## 2019-11-24 DIAGNOSIS — F5101 Primary insomnia: Secondary | ICD-10-CM | POA: Diagnosis not present

## 2019-11-24 DIAGNOSIS — I129 Hypertensive chronic kidney disease with stage 1 through stage 4 chronic kidney disease, or unspecified chronic kidney disease: Secondary | ICD-10-CM | POA: Diagnosis not present

## 2020-01-03 ENCOUNTER — Other Ambulatory Visit: Payer: Self-pay | Admitting: Family Medicine

## 2020-01-13 DIAGNOSIS — H18523 Epithelial (juvenile) corneal dystrophy, bilateral: Secondary | ICD-10-CM | POA: Diagnosis not present

## 2020-01-13 DIAGNOSIS — H353131 Nonexudative age-related macular degeneration, bilateral, early dry stage: Secondary | ICD-10-CM | POA: Diagnosis not present

## 2020-01-13 DIAGNOSIS — H524 Presbyopia: Secondary | ICD-10-CM | POA: Diagnosis not present

## 2020-01-13 DIAGNOSIS — Z961 Presence of intraocular lens: Secondary | ICD-10-CM | POA: Diagnosis not present

## 2020-03-01 NOTE — Progress Notes (Signed)
Established Patient Office Visit  Subjective:  Patient ID: Pamela Schwartz, female    DOB: 08/27/1941  Age: 79 y.o. MRN: [id number]  CC:  Chief Complaint  Patient presents with  . Hyperlipidemia  . Hypertension   HPI Pamela Schwartz presents with Hypertension which was first diagnosed several years ago.  Her current cardiac medication regimen includes chlorthalidone, Toprol xl (Metoprolol) and Amlodipine (Norvasc).  She is tolerating the medication well without side effects.  Compliance with treatment has been good; she takes her medication as directed and follows up as directed.      Pt presents with hyperlipidemia.  Current treatment includes Pravachol and Fish oil and a low cholesterol/low fat diet.  Compliance with treatment has been good; she takes her medication as directed, maintains her low cholesterol diet, and follows up as directed.  She denies experiencing any hypercholesterolemia related symptoms.      Pamela Schwartz presents with a diagnosis of impaired fasting glucose.  Compliance with treatment has been good; maintains her diet, follows up as directed, and maintains her exercise regimen.  Left hip pain - x 2 years intermittently. Worse now. Using tramadol once daily. Unable to take nsaids.   Past Medical History:  Diagnosis Date  . Age-related osteoporosis without current pathological fracture   . Arthritis   . Barrett's esophagus without dysplasia   . Cancer [facility])    denies  . Chronic kidney disease   . GERD (gastroesophageal reflux disease)   . Hearing loss   . History of COVID-19   . Hypercholesterolemia   . Hypertension   . Hypertensive chronic kidney disease with stage 1 through stage 4 chronic kidney disease, or unspecified chronic kidney disease   . IBS (irritable bowel syndrome)   . Impaired fasting glucose   . Low back pain   . Mixed hyperlipidemia   . Paresthesia of skin   . Primary insomnia   . Scars   . Vitamin D deficiency, unspecified     Past Surgical  History:  Procedure Laterality Date  . ANTERIOR CERVICAL DECOMP/DISCECTOMY FUSION N/A 03/06/2016   Procedure: ACDF - C5-C6 - C6-C7  ;  Surgeon: Earnie Larsson, MD;  Location: [facility];  Service: Neurosurgery;  Laterality: N/A;  ACDF - C5-C6 - C6-C7    . APPENDECTOMY    . BUNIONECTOMY     right and left  . CARDIAC CATHETERIZATION     had in [place] around 2012  . CATARACT EXTRACTION W/ INTRAOCULAR LENS  IMPLANT, BILATERAL    . CESAREAN SECTION    . COLONOSCOPY W/ BIOPSIES AND POLYPECTOMY    . MULTIPLE TOOTH EXTRACTIONS    . TUBAL LIGATION      Family History  Problem Relation Age of Onset  . Heart attack Mother   . Heart attack Father   . Hypertension Sister   . Hypertension Brother   . Renal Disease Brother   . Heart attack Brother   . Multiple myeloma Brother   . Hypertension Sister   . Hypertension Sister   . Heart attack Sister     Social History   Socioeconomic History  . Marital status: Married    Spouse name: Not on file  . Number of children: 3  . Years of education: Not on file  . Highest education level: Not on file  Occupational History  . Occupation: Retired Therapist, sports  Tobacco Use  . Smoking status: Former Smoker    Packs/day: 0.50    Types: Cigarettes    Quit  date: 10/02/2015    Years since quitting: 4.4  . Smokeless tobacco: Never Used  Substance and Sexual Activity  . Alcohol use: No  . Drug use: No  . Sexual activity: Not on file  Other Topics Concern  . Not on file  Social History Narrative  . Not on file   Social Determinants of Health   Financial Resource Strain:   . Difficulty of Paying Living Expenses:   Food Insecurity:   . Worried About Charity fundraiser in the Last Year:   . Arboriculturist in the Last Year:   Transportation Needs:   . Film/video editor (Medical):   Marland Kitchen Lack of Transportation (Non-Medical):   Physical Activity:   . Days of Exercise per Week:   . Minutes of Exercise per Session:   Stress:   . Feeling of Stress  :   Social Connections:   . Frequency of Communication with Friends and Family:   . Frequency of Social Gatherings with Friends and Family:   . Attends Religious Services:   . Active Member of Clubs or Organizations:   . Attends Archivist Meetings:   Marland Kitchen Marital Status:   Intimate Partner Violence:   . Fear of Current or Ex-Partner:   . Emotionally Abused:   Marland Kitchen Physically Abused:   . Sexually Abused:     Outpatient Medications Prior to Visit  Medication Sig Dispense Refill  . aspirin 81 MG tablet Take 81 mg by mouth daily.    . chlorthalidone (HYGROTON) 25 MG tablet Take 25 mg by mouth daily.    Marland Kitchen dicyclomine (BENTYL) 20 MG tablet Take 20 mg by mouth 4 (four) times daily -  before meals and at bedtime.  1  . pantoprazole (PROTONIX) 40 MG tablet Take 40 mg by mouth daily.  1  . amLODipine (NORVASC) 10 MG tablet Take 10 mg by mouth daily.  1  . metoprolol succinate (TOPROL-XL) 100 MG 24 hr tablet TAKE 3 TABLETS BY MOUTH EVERY DAY 270 tablet 1  . omega-3 acid ethyl esters (LOVAZA) 1 g capsule Take 2 capsules by mouth 2 (two) times daily.    . pravastatin (PRAVACHOL) 40 MG tablet Take 40 mg by mouth daily.    . promethazine (PHENERGAN) 25 MG tablet take 1 tablet (25 mg) by oral route 4 times per day prn nausea/vomiting    . traMADol (ULTRAM) 50 MG tablet Take 50 mg by mouth every 8 (eight) hours as needed for moderate pain.   2  . zolpidem (AMBIEN CR) 12.5 MG CR tablet Take 12.5 mg by mouth at bedtime.  1  . Multiple Vitamins-Minerals (PRESERVISION AREDS PO) Take 1 tablet by mouth 2 (two) times daily.    . Omega-3 Fatty Acids (FISH OIL) 1000 MG CAPS Take 1 capsule by mouth 2 (two) times daily.     No facility-administered medications prior to visit.    Allergies  Allergen Reactions  . Clonidine Derivatives Other (See Comments)  . Dexilant [Dexlansoprazole] Diarrhea  . Hydralazine   . Nickel   . Tape Other (See Comments)    adhesive adhesive  . Tizanidine Hcl Other (See  Comments)    Somnolence  . Zantac [Ranitidine Hcl]   . Chantix [Varenicline] Rash    ROS Review of Systems  Constitutional: Negative for fatigue and fever.  HENT: Negative for congestion, ear pain, sinus pressure and sore throat.   Eyes: Negative for pain.  Respiratory: Negative for cough, chest tightness, shortness of  breath and wheezing.   Cardiovascular: Negative for chest pain and palpitations.  Gastrointestinal: Positive for diarrhea. Negative for abdominal pain, constipation, nausea and vomiting.       Intermittent.  Genitourinary: Negative for dysuria and hematuria.  Musculoskeletal: Negative for arthralgias, back pain, joint swelling and myalgias.  Skin: Negative for rash.       Hair thinning.  Neurological: Negative for dizziness, weakness and headaches.  Psychiatric/Behavioral: Negative for dysphoric mood. The patient is not nervous/anxious.       Objective:    Physical Exam  Constitutional: She is oriented to person, place, and time. She appears well-developed and well-nourished.  Cardiovascular: Normal rate and normal heart sounds.  Pulmonary/Chest: Effort normal and breath sounds normal. No respiratory distress.  Abdominal: Soft. Bowel sounds are normal. There is no abdominal tenderness.  Neurological: She is alert and oriented to person, place, and time.  Psychiatric: She has a normal mood and affect. Her behavior is normal.  BL foot tenderness. Tender over left SI joint. FROM of left hip with tenderness  in the SI joint area.   BP 140/80   Pulse 78   Temp 97.8 F (36.6 C)   Resp 16   Ht '5\' 4"'$  (1.626 m)   Wt 146 lb 6.4 oz (66.4 kg)   BMI 25.13 kg/m  Wt Readings from Last 3 Encounters:  03/02/20 146 lb 6.4 oz (66.4 kg)  03/23/19 150 lb (68 kg)  03/06/16 150 lb (68 kg)     Health Maintenance Due  Topic Date Due  . COVID-19 Vaccine (1) Never done  . MAMMOGRAM  10/30/2019    There are no preventive care reminders to display for this patient.  No  results found for: TSH Lab Results  Component Value Date   WBC 8.0 03/06/2016   HGB 13.3 03/06/2016   HCT 42.1 03/06/2016   MCV 92.5 03/06/2016   PLT 204 03/06/2016   Lab Results  Component Value Date   NA 141 03/06/2016   K 4.4 03/06/2016   CO2 23 03/06/2016   GLUCOSE 106 (H) 03/06/2016   BUN 25 (H) 03/06/2016   CREATININE 1.56 (H) 03/06/2016   CALCIUM 9.9 03/06/2016   ANIONGAP 12 03/06/2016   No results found for: CHOL No results found for: HDL No results found for: LDLCALC No results found for: TRIG No results found for: CHOLHDL No results found for: HGBA1C    Assessment & Plan:  1. Essential hypertension, benign Well controlled.  No changes to medicines.  Continue to work on eating a healthy diet and exercise.  Labs drawn today.  - CBC - Comprehensive metabolic panel  2. Mixed hyperlipidemia No changes to medicines.  Continue to work on eating a healthy diet and exercise.  Labs drawn today.  - Lipid panel  3. Impaired fasting glucose Low sugar diet. - Hemoglobin A1c  4. Telogen effluvium Check ferritin and tsh. - TSH - Ferritin  5. Lumbar pain - DG Lumbar Spine Complete; Future - diclofenac Sodium (VOLTAREN) 1 % GEL; Apply 4 g topically in the morning, at noon, in the evening, and at bedtime.  Dispense: 1000 g; Refill: 0  6. Sacroiliac inflammation (HCC) - DG Lumbar Spine Complete; Future - diclofenac Sodium (VOLTAREN) 1 % GEL; Apply 4 g topically in the morning, at noon, in the evening, and at bedtime.  Dispense: 1000 g; Refill: 0  7. Foot pain, bilateral - diclofenac Sodium (VOLTAREN) 1 % GEL; Apply 4 g topically in the morning, at noon, in  the evening, and at bedtime.  Dispense: 1000 g; Refill: 0  8. Aortic atherosclerosis: Recommend change pravastatin to crestor 40 mg once daily.   Follow-up: Return in about 3 months (around 06/01/2020) for fasting.    Rochel Brome, MD

## 2020-03-02 ENCOUNTER — Ambulatory Visit (INDEPENDENT_AMBULATORY_CARE_PROVIDER_SITE_OTHER): Payer: Medicare Other | Admitting: Family Medicine

## 2020-03-02 ENCOUNTER — Other Ambulatory Visit: Payer: Self-pay

## 2020-03-02 ENCOUNTER — Other Ambulatory Visit: Payer: Self-pay | Admitting: Family Medicine

## 2020-03-02 ENCOUNTER — Encounter: Payer: Self-pay | Admitting: Family Medicine

## 2020-03-02 VITALS — BP 140/80 | HR 78 | Temp 97.8°F | Resp 16 | Ht 64.0 in | Wt 146.4 lb

## 2020-03-02 DIAGNOSIS — M79671 Pain in right foot: Secondary | ICD-10-CM | POA: Insufficient documentation

## 2020-03-02 DIAGNOSIS — I10 Essential (primary) hypertension: Secondary | ICD-10-CM | POA: Insufficient documentation

## 2020-03-02 DIAGNOSIS — M461 Sacroiliitis, not elsewhere classified: Secondary | ICD-10-CM | POA: Insufficient documentation

## 2020-03-02 DIAGNOSIS — M545 Low back pain, unspecified: Secondary | ICD-10-CM

## 2020-03-02 DIAGNOSIS — L650 Telogen effluvium: Secondary | ICD-10-CM | POA: Diagnosis not present

## 2020-03-02 DIAGNOSIS — R7301 Impaired fasting glucose: Secondary | ICD-10-CM | POA: Insufficient documentation

## 2020-03-02 DIAGNOSIS — E782 Mixed hyperlipidemia: Secondary | ICD-10-CM | POA: Insufficient documentation

## 2020-03-02 DIAGNOSIS — M79672 Pain in left foot: Secondary | ICD-10-CM | POA: Diagnosis not present

## 2020-03-02 DIAGNOSIS — I700 Atherosclerosis of aorta: Secondary | ICD-10-CM

## 2020-03-02 DIAGNOSIS — M5136 Other intervertebral disc degeneration, lumbar region: Secondary | ICD-10-CM | POA: Diagnosis not present

## 2020-03-02 HISTORY — DX: Telogen effluvium: L65.0

## 2020-03-02 HISTORY — DX: Atherosclerosis of aorta: I70.0

## 2020-03-02 HISTORY — DX: Sacroiliitis, not elsewhere classified: M46.1

## 2020-03-02 HISTORY — DX: Pain in right foot: M79.671

## 2020-03-02 HISTORY — DX: Low back pain, unspecified: M54.50

## 2020-03-02 MED ORDER — METOPROLOL SUCCINATE ER 100 MG PO TB24: 300.0000 mg | ORAL_TABLET | Freq: Every day | ORAL | 1 refills | Status: DC

## 2020-03-02 MED ORDER — OMEGA-3-ACID ETHYL ESTERS 1 G PO CAPS: 2.0000 | ORAL_CAPSULE | Freq: Two times a day (BID) | ORAL | 1 refills | Status: DC

## 2020-03-02 MED ORDER — DICLOFENAC SODIUM 1 % EX GEL: 4.0000 g | Freq: Four times a day (QID) | CUTANEOUS | 0 refills | Status: DC

## 2020-03-02 MED ORDER — AMLODIPINE BESYLATE 10 MG PO TABS: 10.0000 mg | ORAL_TABLET | Freq: Every day | ORAL | 3 refills | Status: DC

## 2020-03-02 MED ORDER — ZOLPIDEM TARTRATE ER 12.5 MG PO TBCR: 12.5000 mg | EXTENDED_RELEASE_TABLET | Freq: Every day | ORAL | 5 refills | Status: DC

## 2020-03-02 MED ORDER — TRAMADOL HCL 50 MG PO TABS: 50.0000 mg | ORAL_TABLET | Freq: Three times a day (TID) | ORAL | 2 refills | Status: DC | PRN

## 2020-03-02 MED ORDER — PRAVASTATIN SODIUM 40 MG PO TABS: 40.0000 mg | ORAL_TABLET | Freq: Every day | ORAL | 3 refills | Status: DC

## 2020-03-02 MED ORDER — PROMETHAZINE HCL 25 MG PO TABS: ORAL_TABLET | ORAL | 3 refills | Status: DC

## 2020-03-02 NOTE — Patient Instructions (Signed)
Get xrays.  Try voltaren gel for feet and for back/hip. Try Rogaine otc for hair loss.

## 2020-03-03 LAB — COMPREHENSIVE METABOLIC PANEL
ALT: 18 IU/L (ref 0–32)
AST: 21 IU/L (ref 0–40)
Albumin/Globulin Ratio: 1.8 (ref 1.2–2.2)
Albumin: 4.4 g/dL (ref 3.7–4.7)
Alkaline Phosphatase: 93 IU/L (ref 39–117)
BUN/Creatinine Ratio: 17 (ref 12–28)
BUN: 24 mg/dL (ref 8–27)
Bilirubin Total: 0.5 mg/dL (ref 0.0–1.2)
CO2: 25 mmol/L (ref 20–29)
Calcium: 9.7 mg/dL (ref 8.7–10.3)
Chloride: 99 mmol/L (ref 96–106)
Creatinine, Ser: 1.43 mg/dL — ABNORMAL HIGH (ref 0.57–1.00)
GFR calc Af Amer: 41 mL/min/{1.73_m2} — ABNORMAL LOW (ref >59)
GFR calc non Af Amer: 36 mL/min/{1.73_m2} — ABNORMAL LOW (ref >59)
Globulin, Total: 2.5 g/dL (ref 1.5–4.5)
Glucose: 112 mg/dL — ABNORMAL HIGH (ref 65–99)
Potassium: 3.4 mmol/L — ABNORMAL LOW (ref 3.5–5.2)
Sodium: 141 mmol/L (ref 134–144)
Total Protein: 6.9 g/dL (ref 6.0–8.5)

## 2020-03-03 LAB — LIPID PANEL
Chol/HDL Ratio: 3.2 ratio (ref 0.0–4.4)
Cholesterol, Total: 194 mg/dL (ref 100–199)
HDL: 60 mg/dL (ref >39)
LDL Chol Calc (NIH): 106 mg/dL — ABNORMAL HIGH (ref 0–99)
Triglycerides: 161 mg/dL — ABNORMAL HIGH (ref 0–149)
VLDL Cholesterol Cal: 28 mg/dL (ref 5–40)

## 2020-03-03 LAB — CBC
Hematocrit: 45.3 % (ref 34.0–46.6)
Hemoglobin: 15.4 g/dL (ref 11.1–15.9)
MCH: 31.2 pg (ref 26.6–33.0)
MCHC: 34 g/dL (ref 31.5–35.7)
MCV: 92 fL (ref 79–97)
Platelets: 203 10*3/uL (ref 150–450)
RBC: 4.94 x10E6/uL (ref 3.77–5.28)
RDW: 12.3 % (ref 11.7–15.4)
WBC: 8.6 10*3/uL (ref 3.4–10.8)

## 2020-03-03 LAB — FERRITIN: Ferritin: 43 ng/mL (ref 15–150)

## 2020-03-03 LAB — HEMOGLOBIN A1C
Est. average glucose Bld gHb Est-mCnc: 120 mg/dL
Hgb A1c MFr Bld: 5.8 % — ABNORMAL HIGH (ref 4.8–5.6)

## 2020-03-03 LAB — CARDIOVASCULAR RISK ASSESSMENT

## 2020-03-03 LAB — TSH: TSH: 1.12 u[IU]/mL (ref 0.450–4.500)

## 2020-03-04 ENCOUNTER — Other Ambulatory Visit: Payer: Self-pay

## 2020-03-04 DIAGNOSIS — M545 Low back pain, unspecified: Secondary | ICD-10-CM

## 2020-03-04 MED ORDER — ROSUVASTATIN CALCIUM 40 MG PO TABS: 40.0000 mg | ORAL_TABLET | Freq: Every day | ORAL | 0 refills | Status: DC

## 2020-03-07 ENCOUNTER — Telehealth: Payer: Self-pay

## 2020-03-07 NOTE — Telephone Encounter (Signed)
PA's submitted for Diclofenac Gel and Vascepa via covermymeds. Diclofenac approved from 03/04/2019-03/02/2023.  Vascepa was denied stating patient does not meet requirements according to their plan.

## 2020-03-08 ENCOUNTER — Other Ambulatory Visit: Payer: Self-pay

## 2020-03-08 ENCOUNTER — Encounter: Payer: Self-pay | Admitting: Family Medicine

## 2020-03-08 DIAGNOSIS — M461 Sacroiliitis, not elsewhere classified: Secondary | ICD-10-CM

## 2020-03-08 DIAGNOSIS — M545 Low back pain, unspecified: Secondary | ICD-10-CM

## 2020-03-25 DIAGNOSIS — M6281 Muscle weakness (generalized): Secondary | ICD-10-CM | POA: Diagnosis not present

## 2020-03-25 DIAGNOSIS — M545 Low back pain: Secondary | ICD-10-CM | POA: Diagnosis not present

## 2020-04-01 ENCOUNTER — Other Ambulatory Visit: Payer: Self-pay | Admitting: Family Medicine

## 2020-04-06 ENCOUNTER — Other Ambulatory Visit: Payer: Self-pay

## 2020-04-06 MED ORDER — ZOLPIDEM TARTRATE ER 12.5 MG PO TBCR: 12.5000 mg | EXTENDED_RELEASE_TABLET | Freq: Every day | ORAL | 5 refills | Status: DC

## 2020-04-12 ENCOUNTER — Telehealth: Payer: Self-pay

## 2020-04-12 NOTE — Telephone Encounter (Signed)
PA for Zolpidem Tartrate submitted and approved via [email or web address]. Additional info is being sent via fax.

## 2020-04-13 DIAGNOSIS — M545 Low back pain: Secondary | ICD-10-CM | POA: Diagnosis not present

## 2020-04-13 DIAGNOSIS — M6281 Muscle weakness (generalized): Secondary | ICD-10-CM | POA: Diagnosis not present

## 2020-04-16 ENCOUNTER — Other Ambulatory Visit: Payer: Self-pay | Admitting: Family Medicine

## 2020-04-21 DIAGNOSIS — M6281 Muscle weakness (generalized): Secondary | ICD-10-CM | POA: Diagnosis not present

## 2020-04-21 DIAGNOSIS — M545 Low back pain: Secondary | ICD-10-CM | POA: Diagnosis not present

## 2020-04-26 DIAGNOSIS — M6281 Muscle weakness (generalized): Secondary | ICD-10-CM | POA: Diagnosis not present

## 2020-04-26 DIAGNOSIS — M545 Low back pain: Secondary | ICD-10-CM | POA: Diagnosis not present

## 2020-04-28 DIAGNOSIS — M6281 Muscle weakness (generalized): Secondary | ICD-10-CM | POA: Diagnosis not present

## 2020-04-28 DIAGNOSIS — M545 Low back pain: Secondary | ICD-10-CM | POA: Diagnosis not present

## 2020-05-12 ENCOUNTER — Other Ambulatory Visit: Payer: Self-pay | Admitting: Family Medicine

## 2020-05-17 ENCOUNTER — Encounter: Payer: Self-pay | Admitting: Family Medicine

## 2020-05-17 DIAGNOSIS — N281 Cyst of kidney, acquired: Secondary | ICD-10-CM | POA: Diagnosis not present

## 2020-05-17 DIAGNOSIS — D259 Leiomyoma of uterus, unspecified: Secondary | ICD-10-CM | POA: Diagnosis not present

## 2020-05-17 DIAGNOSIS — I700 Atherosclerosis of aorta: Secondary | ICD-10-CM | POA: Diagnosis not present

## 2020-05-17 DIAGNOSIS — K529 Noninfective gastroenteritis and colitis, unspecified: Secondary | ICD-10-CM | POA: Diagnosis not present

## 2020-05-17 DIAGNOSIS — K6389 Other specified diseases of intestine: Secondary | ICD-10-CM | POA: Diagnosis not present

## 2020-05-23 ENCOUNTER — Other Ambulatory Visit: Payer: Self-pay

## 2020-05-23 MED ORDER — ROSUVASTATIN CALCIUM 40 MG PO TABS: 40.0000 mg | ORAL_TABLET | Freq: Every day | ORAL | 0 refills | Status: DC

## 2020-05-25 ENCOUNTER — Other Ambulatory Visit: Payer: Self-pay

## 2020-05-25 ENCOUNTER — Encounter: Payer: Self-pay | Admitting: Family Medicine

## 2020-05-25 ENCOUNTER — Ambulatory Visit (INDEPENDENT_AMBULATORY_CARE_PROVIDER_SITE_OTHER): Payer: Medicare Other | Admitting: Family Medicine

## 2020-05-25 VITALS — BP 132/78 | HR 82 | Temp 97.3°F | Ht 66.0 in | Wt 147.0 lb

## 2020-05-25 DIAGNOSIS — K529 Noninfective gastroenteritis and colitis, unspecified: Secondary | ICD-10-CM | POA: Diagnosis not present

## 2020-05-25 HISTORY — DX: Noninfective gastroenteritis and colitis, unspecified: K52.9

## 2020-05-25 NOTE — Progress Notes (Signed)
Subjective:  Patient ID: Pamela Schwartz, female    DOB: 11-Nov-1941  Age: 79 y.o. MRN: [id number]  Chief Complaint  Patient presents with  . Hospitalization Follow-up    Colitis    HPI  Patient presents today for a hospital f/u from [facility] on 05/17/2020, presented to the hospital with onset of right flank pain that evening. No trauma, nausea, vomiting etc. reported. Tried treating with tramadol at home which was ineffective. Physical exam and CT findings were consistent with colitis in ER.  Pt states she has irritable bowel -diarrhea at times.  Pt with abdominal pain 10/10 prior to ER visit. Pt states no pain currently-bloating and tenderness.  Labs WBC 12.8, Glucose 133, Cr 1.60   CT/CT Abd-Pelv W/O IV CM Impression:   1.No hydronephrosis or nephrolithiasis. 2. Mild thickened appearance of the proximal colon may represent mild colitis. No bowel obstruction. 3. Aortic Atherosclerosis Per ER report-Upon discharge patients abdomen remains soft, nondistended and mildly tender.  discharged on NSAIDS, Cipro, and Flagyl.  Today patient states she has taken 5 days worth of medication due to it causing her to "feel bad" she stopped the medication.     Current Outpatient Medications on File Prior to Visit  Medication Sig Dispense Refill  . amLODipine (NORVASC) 10 MG tablet Take 1 tablet (10 mg total) by mouth daily. 90 tablet 3  . aspirin 81 MG tablet Take 81 mg by mouth daily.    . chlorthalidone (HYGROTON) 25 MG tablet TAKE ONE TABLET BY MOUTH EVERY DAY 90 tablet 3  . diclofenac Sodium (VOLTAREN) 1 % GEL Apply 4 g topically in the morning, at noon, in the evening, and at bedtime. 1000 g 0  . dicyclomine (BENTYL) 20 MG tablet Take 20 mg by mouth 4 (four) times daily -  before meals and at bedtime.  1  . metoprolol succinate (TOPROL-XL) 100 MG 24 hr tablet Take 3 tablets (300 mg total) by mouth daily. Take with or immediately following a meal. 270 tablet 1  . omega-3 acid ethyl esters  (LOVAZA) 1 g capsule Take 2 capsules (2 g total) by mouth 2 (two) times daily. 360 capsule 1  . pantoprazole (PROTONIX) 40 MG tablet Take 40 mg by mouth daily.  1  . promethazine (PHENERGAN) 25 MG tablet take 1 tablet (25 mg) by oral route 4 times per day prn nausea/vomiting 60 tablet 3  . rosuvastatin (CRESTOR) 40 MG tablet Take 1 tablet (40 mg total) by mouth daily. 90 tablet 0  . traMADol (ULTRAM) 50 MG tablet TAKE ONE TABLET BY MOUTH EVERY 8 HOURS AS NEEDED FOR BACK PAIN 90 tablet 2  . Vitamin D, Ergocalciferol, (DRISDOL) 1.25 MG (50000 UNIT) CAPS capsule TAKE ONE CAPSULE BY MOUTH ONCE WEEKLY 12 capsule 3  . zolpidem (AMBIEN CR) 12.5 MG CR tablet Take 1 tablet (12.5 mg total) by mouth at bedtime. 30 tablet 5   No current facility-administered medications on file prior to visit.   Past Medical History:  Diagnosis Date  . Age-related osteoporosis without current pathological fracture   . Arthritis   . Barrett's esophagus without dysplasia   . Cancer [facility])    denies  . Chronic kidney disease   . GERD (gastroesophageal reflux disease)   . Hearing loss   . History of COVID-19   . Hypercholesterolemia   . Hypertension   . Hypertensive chronic kidney disease with stage 1 through stage 4 chronic kidney disease, or unspecified chronic kidney disease   . IBS (  irritable bowel syndrome)   . Impaired fasting glucose   . Low back pain   . Mixed hyperlipidemia   . Paresthesia of skin   . Primary insomnia   . Scars   . Vitamin D deficiency, unspecified    Past Surgical History:  Procedure Laterality Date  . ANTERIOR CERVICAL DECOMP/DISCECTOMY FUSION N/A 03/06/2016   Procedure: ACDF - C5-C6 - C6-C7  ;  Surgeon: Earnie Larsson, MD;  Location: [facility];  Service: Neurosurgery;  Laterality: N/A;  ACDF - C5-C6 - C6-C7    . APPENDECTOMY    . BUNIONECTOMY     right and left  . CARDIAC CATHETERIZATION     had in [place] around 2012  . CATARACT EXTRACTION W/ INTRAOCULAR LENS  IMPLANT, BILATERAL     . CESAREAN SECTION    . COLONOSCOPY W/ BIOPSIES AND POLYPECTOMY    . MULTIPLE TOOTH EXTRACTIONS    . TUBAL LIGATION      Family History  Problem Relation Age of Onset  . Heart attack Mother   . Heart attack Father   . Hypertension Sister   . Hypertension Brother   . Renal Disease Brother   . Heart attack Brother   . Multiple myeloma Brother   . Hypertension Sister   . Hypertension Sister   . Heart attack Sister    Social History   Socioeconomic History  . Marital status: Married    Spouse name: Not on file  . Number of children: 3  . Years of education: Not on file  . Highest education level: Not on file  Occupational History  . Occupation: Retired Therapist, sports  Tobacco Use  . Smoking status: Former Smoker    Packs/day: 0.50    Types: Cigarettes    Quit date: 10/02/2015    Years since quitting: 4.6  . Smokeless tobacco: Never Used  Vaping Use  . Vaping Use: Never used  Substance and Sexual Activity  . Alcohol use: No  . Drug use: No  . Sexual activity: Not on file  Other Topics Concern  . Not on file  Social History Narrative  . Not on file   Social Determinants of Health   Financial Resource Strain:   . Difficulty of Paying Living Expenses:   Food Insecurity:   . Worried About Charity fundraiser in the Last Year:   . Arboriculturist in the Last Year:   Transportation Needs:   . Film/video editor (Medical):   Marland Kitchen Lack of Transportation (Non-Medical):   Physical Activity:   . Days of Exercise per Week:   . Minutes of Exercise per Session:   Stress:   . Feeling of Stress :   Social Connections:   . Frequency of Communication with Friends and Family:   . Frequency of Social Gatherings with Friends and Family:   . Attends Religious Services:   . Active Member of Clubs or Organizations:   . Attends Archivist Meetings:   Marland Kitchen Marital Status:     Review of Systems  Constitutional: Negative for chills, fatigue and fever.  HENT: Negative for  congestion and rhinorrhea.   Respiratory: Negative for shortness of breath.   Cardiovascular: Negative for chest pain.  Gastrointestinal: Positive for abdominal pain. Negative for constipation, diarrhea, nausea and vomiting.       Stools are dark.  Genitourinary: Negative for dysuria, frequency and urgency.  Musculoskeletal: Negative for back pain and myalgias.  Neurological: Positive for weakness and light-headedness. Negative  for dizziness and headaches.     Objective:  BP 132/78   Pulse 82   Temp (!) 97.3 F (36.3 C)   Ht '5\' 6"'$  (1.676 m)   Wt 147 lb (66.7 kg)   SpO2 99%   BMI 23.73 kg/m   BP/Weight 05/25/2020 03/02/2020 1/47/8295  Systolic BP 621 308 657  Diastolic BP 78 80 74  Wt. (Lbs) 147 146.4 -  BMI 23.73 25.13 -    Physical Exam Vitals reviewed.  Constitutional:      Appearance: Normal appearance. She is normal weight.  Cardiovascular:     Rate and Rhythm: Normal rate and regular rhythm.     Pulses: Normal pulses.     Heart sounds: Normal heart sounds.  Pulmonary:     Effort: Pulmonary effort is normal. No respiratory distress.     Breath sounds: Normal breath sounds.  Abdominal:     Tenderness: There is no abdominal tenderness.  Neurological:     Mental Status: She is alert and oriented to person, place, and time.  Psychiatric:        Mood and Affect: Mood normal.        Behavior: Behavior normal.     Lab Results  Component Value Date   WBC 8.6 03/02/2020   HGB 15.4 03/02/2020   HCT 45.3 03/02/2020   PLT 203 03/02/2020   GLUCOSE 112 (H) 03/02/2020   CHOL 194 03/02/2020   TRIG 161 (H) 03/02/2020   HDL 60 03/02/2020   LDLCALC 106 (H) 03/02/2020   ALT 18 03/02/2020   AST 21 03/02/2020   NA 141 03/02/2020   K 3.4 (L) 03/02/2020   CL 99 03/02/2020   CREATININE 1.43 (H) 03/02/2020   BUN 24 03/02/2020   CO2 25 03/02/2020   TSH 1.120 03/02/2020   HGBA1C 5.8 (H) 03/02/2020    Assessment & Plan:  1. Colitis Complete medications-if symptoms not  completely resolved contact for additional work up-only 7 days of medication given. Miralax, avoid acidic foods, vitawater-avoid caffeine. Follow-up: information on colitis d/w pt An After Visit Summary was printed and given to the patient.  [place] [phone]

## 2020-05-25 NOTE — Patient Instructions (Addendum)
  Stop diclofenac. Finish Cipro and Flagyl. Eat dairy products to help with getting protein in your daily diet. Take miralax daily. Drink VitaWater   Colitis is inflammation of the colon. Colitis may last a short time (be acute), or it may last a long time (become chronic). What are the causes? This condition may be caused by:  Viruses.  Bacteria.  Reaction to medicine.  Certain autoimmune diseases such as Crohn's disease or ulcerative colitis.  Radiation treatment.  Decreased blood flow to the bowel (ischemia). What are the signs or symptoms? Symptoms of this condition include:  Watery diarrhea.  Passing bloody or tarry stool.  Pain.  Fever.  Vomiting.  Tiredness (fatigue).  Weight loss.  Bloating.  Abdominal pain.  Having fewer bowel movements than usual.  A strong and sudden urge to have a bowel movement.  Feeling like the bowel is not empty after a bowel movement. How is this diagnosed? This condition is diagnosed with a stool test or a blood test. You may also have other tests, such as:  X-rays.  CT scan.  Colonoscopy.  Endoscopy.  Biopsy. How is this treated? Treatment for this condition depends on the cause. The condition may be treated by:  Resting the bowel. This involves not eating or drinking for a period of time.  Fluids that are given through an IV.  Medicine for pain and diarrhea.  Antibiotic medicines.  Cortisone medicines.  Surgery. Follow these instructions at home: Eating and drinking   Follow instructions from your health care provider about eating or drinking restrictions.  Drink enough fluid to keep your urine pale yellow.  Work with a dietitian to determine which foods cause your condition to flare up.  Avoid foods that cause flare-ups.  Eat a well-balanced diet. General instructions  If you were prescribed an antibiotic medicine, take it as told by your health care provider. Do not stop taking the  antibiotic even if you start to feel better.  Take over-the-counter and prescription medicines only as told by your health care provider.  Keep all follow-up visits as told by your health care provider. This is important. Contact a health care provider if:  Your symptoms do not go away.  You develop new symptoms. Get help right away if you:  Have a fever that does not go away with treatment.  Develop chills.  Have extreme weakness, fainting, or dehydration.  Have repeated vomiting.  Develop severe pain in your abdomen.  Pass bloody or tarry stool. Summary  Colitis is inflammation of the colon. Colitis may last a short time (be acute), or it may last a long time (become chronic).  Treatment for this condition depends on the cause and may include resting the bowel, taking medicines, or having surgery.  If you were prescribed an antibiotic medicine, take it as told by your health care provider. Do not stop taking the antibiotic even if you start to feel better.  Get help right away if you develop severe pain in your abdomen.  Keep all follow-up visits as told by your health care provider. This is important. This information is not intended to replace advice given to you by your health care provider. Make sure you discuss any questions you have with your health care provider. Document Revised: 05/01/2018 Document Reviewed: 05/01/2018 Elsevier Patient Education  2020 [employer].

## 2020-06-02 ENCOUNTER — Ambulatory Visit: Payer: Medicare Other | Admitting: Family Medicine

## 2020-06-16 ENCOUNTER — Other Ambulatory Visit: Payer: Self-pay | Admitting: Family Medicine

## 2020-06-16 DIAGNOSIS — K219 Gastro-esophageal reflux disease without esophagitis: Secondary | ICD-10-CM

## 2020-06-28 ENCOUNTER — Ambulatory Visit (INDEPENDENT_AMBULATORY_CARE_PROVIDER_SITE_OTHER): Payer: Medicare Other | Admitting: Family Medicine

## 2020-06-28 ENCOUNTER — Other Ambulatory Visit: Payer: Self-pay

## 2020-06-28 VITALS — BP 130/68 | HR 74 | Temp 97.2°F | Resp 16 | Ht 66.0 in | Wt 148.0 lb

## 2020-06-28 DIAGNOSIS — I700 Atherosclerosis of aorta: Secondary | ICD-10-CM

## 2020-06-28 DIAGNOSIS — I10 Essential (primary) hypertension: Secondary | ICD-10-CM

## 2020-06-28 DIAGNOSIS — R7301 Impaired fasting glucose: Secondary | ICD-10-CM | POA: Diagnosis not present

## 2020-06-28 DIAGNOSIS — E782 Mixed hyperlipidemia: Secondary | ICD-10-CM | POA: Diagnosis not present

## 2020-06-28 NOTE — Progress Notes (Signed)
Established Patient Office Visit  Subjective:  Patient ID: Pamela Schwartz, female    DOB: 12-17-1940  Age: 79 y.o. MRN: [id number]  CC:  Chief Complaint  Patient presents with  . Hyperlipidemia  . Hypertension   HPI Pamela Schwartz presents with Hypertensive with chronic kidney disease which was first diagnosed several years ago.  Her current cardiac medication regimen includes chlorthalidone, Toprol xl (Metoprolol) and Amlodipine (Norvasc).  She is tolerating the medication well without side effects.  Compliance with treatment has been good; she takes her medication as directed and follows up as directed.      Pt presents with hyperlipidemia.  Current treatment includes Pravachol and Fish oil and a low cholesterol/low fat diet.  Compliance with treatment has been good; she takes her medication as directed, maintains her low cholesterol diet, and follows up as directed.  She denies experiencing any hypercholesterolemia related symptoms.      Pamela Schwartz presents with a diagnosis of impaired fasting glucose.  Compliance with treatment has been good; maintains her diet, follows up as directed, and maintains her exercise regimen.  Left hip pain - x 2 years intermittently. Using tramadol.  Past Medical History:  Diagnosis Date  . Age-related osteoporosis without current pathological fracture   . Arthritis   . Barrett's esophagus without dysplasia   . Cancer [facility])    denies  . Chronic kidney disease   . GERD (gastroesophageal reflux disease)   . Hearing loss   . History of COVID-19   . Hypercholesterolemia   . Hypertension   . Hypertensive chronic kidney disease with stage 1 through stage 4 chronic kidney disease, or unspecified chronic kidney disease   . IBS (irritable bowel syndrome)   . Impaired fasting glucose   . Low back pain   . Mixed hyperlipidemia   . Paresthesia of skin   . Primary insomnia   . Scars   . Vitamin D deficiency, unspecified     Past Surgical History:    Procedure Laterality Date  . ANTERIOR CERVICAL DECOMP/DISCECTOMY FUSION N/A 03/06/2016   Procedure: ACDF - C5-C6 - C6-C7  ;  Surgeon: Earnie Larsson, MD;  Location: [place] [facility];  Service: Neurosurgery;  Laterality: N/A;  ACDF - C5-C6 - C6-C7    . APPENDECTOMY    . BUNIONECTOMY     right and left  . CARDIAC CATHETERIZATION     had in [place] around 2012  . CATARACT EXTRACTION W/ INTRAOCULAR LENS  IMPLANT, BILATERAL    . CESAREAN SECTION    . COLONOSCOPY W/ BIOPSIES AND POLYPECTOMY    . MULTIPLE TOOTH EXTRACTIONS    . TUBAL LIGATION      Family History  Problem Relation Age of Onset  . Heart attack Mother   . Heart attack Father   . Hypertension Sister   . Hypertension Brother   . Renal Disease Brother   . Heart attack Brother   . Multiple myeloma Brother   . Hypertension Sister   . Hypertension Sister   . Heart attack Sister     Social History   Socioeconomic History  . Marital status: Married    Spouse name: Not on file  . Number of children: 3  . Years of education: Not on file  . Highest education level: Not on file  Occupational History  . Occupation: Retired Therapist, sports  Tobacco Use  . Smoking status: Former Smoker    Packs/day: 0.50    Types: Cigarettes    Quit date: 10/02/2015  Years since quitting: 4.7  . Smokeless tobacco: Never Used  Vaping Use  . Vaping Use: Never used  Substance and Sexual Activity  . Alcohol use: No  . Drug use: No  . Sexual activity: Not on file  Other Topics Concern  . Not on file  Social History Narrative  . Not on file   Social Determinants of Health   Financial Resource Strain:   . Difficulty of Paying Living Expenses: Not on file  Food Insecurity:   . Worried About Charity fundraiser in the Last Year: Not on file  . Ran Out of Food in the Last Year: Not on file  Transportation Needs:   . Lack of Transportation (Medical): Not on file  . Lack of Transportation (Non-Medical): Not on file  Physical Activity:   . Days of  Exercise per Week: Not on file  . Minutes of Exercise per Session: Not on file  Stress:   . Feeling of Stress : Not on file  Social Connections:   . Frequency of Communication with Friends and Family: Not on file  . Frequency of Social Gatherings with Friends and Family: Not on file  . Attends Religious Services: Not on file  . Active Member of Clubs or Organizations: Not on file  . Attends Archivist Meetings: Not on file  . Marital Status: Not on file  Intimate Partner Violence:   . Fear of Current or Ex-Partner: Not on file  . Emotionally Abused: Not on file  . Physically Abused: Not on file  . Sexually Abused: Not on file    Outpatient Medications Prior to Visit  Medication Sig Dispense Refill  . amLODipine (NORVASC) 10 MG tablet Take 1 tablet (10 mg total) by mouth daily. 90 tablet 3  . aspirin 81 MG tablet Take 81 mg by mouth daily.    . chlorthalidone (HYGROTON) 25 MG tablet TAKE ONE TABLET BY MOUTH EVERY DAY 90 tablet 3  . metoprolol succinate (TOPROL-XL) 100 MG 24 hr tablet Take 3 tablets (300 mg total) by mouth daily. Take with or immediately following a meal. 270 tablet 1  . pantoprazole (PROTONIX) 40 MG tablet Take 1 tablet(s) by mouth daily 90 tablet 3  . promethazine (PHENERGAN) 25 MG tablet take 1 tablet (25 mg) by oral route 4 times per day prn nausea/vomiting 60 tablet 3  . rosuvastatin (CRESTOR) 40 MG tablet Take 1 tablet (40 mg total) by mouth daily. 90 tablet 0  . traMADol (ULTRAM) 50 MG tablet TAKE ONE TABLET BY MOUTH EVERY 8 HOURS AS NEEDED FOR BACK PAIN 90 tablet 2  . Vitamin D, Ergocalciferol, (DRISDOL) 1.25 MG (50000 UNIT) CAPS capsule TAKE ONE CAPSULE BY MOUTH ONCE WEEKLY 12 capsule 3  . zolpidem (AMBIEN CR) 12.5 MG CR tablet Take 1 tablet (12.5 mg total) by mouth at bedtime. 30 tablet 5  . diclofenac Sodium (VOLTAREN) 1 % GEL Apply 4 g topically in the morning, at noon, in the evening, and at bedtime. 1000 g 0  . dicyclomine (BENTYL) 20 MG tablet  Take 20 mg by mouth 4 (four) times daily -  before meals and at bedtime.  1  . omega-3 acid ethyl esters (LOVAZA) 1 g capsule Take 2 capsules (2 g total) by mouth 2 (two) times daily. 360 capsule 1   No facility-administered medications prior to visit.    Allergies  Allergen Reactions  . Clonidine Derivatives Other (See Comments)  . Dexilant [Dexlansoprazole] Diarrhea  . Hydralazine   .  Nickel   . Tape Other (See Comments)    adhesive adhesive  . Tizanidine Hcl Other (See Comments)    Somnolence  . Zantac [Ranitidine Hcl]   . Chantix [Varenicline] Rash    ROS Review of Systems  Constitutional: Negative for fatigue and fever.  HENT: Negative for congestion, ear pain, sinus pressure and sore throat.   Eyes: Negative for pain.  Respiratory: Negative for cough, chest tightness, shortness of breath and wheezing.   Cardiovascular: Negative for chest pain and palpitations.  Gastrointestinal: Positive for diarrhea and constipation.  Negative for abdominal pain, constipation, nausea and vomiting.       Intermittent.  Genitourinary: Negative for dysuria and hematuria.  Musculoskeletal: Negative myalgias. Patient has low back pain and hip pain.  Skin: Negative for rash.  Neurological: Negative for dizziness, weakness and headaches.  Psychiatric/Behavioral: Negative for dysphoric mood. The patient is not nervous/anxious.       Objective:    Physical Exam  Constitutional: She is oriented to person, place, and time. She appears well-developed and well-nourished.  Cardiovascular: Normal rate and normal heart sounds.  Pulmonary/Chest: Effort normal and breath sounds normal. No respiratory distress.  Abdominal: Soft. Bowel sounds are normal. There is no abdominal tenderness.  Neurological: She is alert and oriented to person, place, and time.  Psychiatric: She has a normal mood and affect. Her behavior is normal.   Diabetic Foot Exam - Simple   Simple Foot Form Diabetic Foot exam was  performed with the following findings: Yes 06/28/2020  9:31 AM  Visual Inspection No deformities, no ulcerations, no other skin breakdown bilaterally: Yes Sensation Testing Intact to touch and monofilament testing bilaterally: Yes Pulse Check Posterior Tibialis and Dorsalis pulse intact bilaterally: Yes Comments    BP 130/68   Pulse 74   Temp (!) 97.2 F (36.2 C)   Resp 16   Ht '5\' 6"'$  (1.676 m)   Wt 148 lb (67.1 kg)   BMI 23.89 kg/m  Wt Readings from Last 3 Encounters:  06/28/20 148 lb (67.1 kg)  05/25/20 147 lb (66.7 kg)  03/02/20 146 lb 6.4 oz (66.4 kg)     Health Maintenance Due  Topic Date Due  . Hepatitis C Screening  Never done  . MAMMOGRAM  10/30/2019  . INFLUENZA VACCINE  06/12/2020    There are no preventive care reminders to display for this patient.  Lab Results  Component Value Date   TSH 1.120 03/02/2020   Lab Results  Component Value Date   WBC 9.3 06/28/2020   HGB 14.3 06/28/2020   HCT 41.5 06/28/2020   MCV 90 06/28/2020   PLT 197 06/28/2020   Lab Results  Component Value Date   NA 140 06/28/2020   K 3.9 06/28/2020   CO2 27 06/28/2020   GLUCOSE 102 (H) 06/28/2020   BUN 17 06/28/2020   CREATININE 1.33 (H) 06/28/2020   BILITOT 0.4 06/28/2020   ALKPHOS 101 06/28/2020   AST 34 06/28/2020   ALT 35 (H) 06/28/2020   PROT 6.7 06/28/2020   ALBUMIN 4.4 06/28/2020   CALCIUM 9.6 06/28/2020   ANIONGAP 12 03/06/2016   Lab Results  Component Value Date   CHOL 151 06/28/2020   Lab Results  Component Value Date   HDL 53 06/28/2020   Lab Results  Component Value Date   LDLCALC 64 06/28/2020   Lab Results  Component Value Date   TRIG 210 (H) 06/28/2020   Lab Results  Component Value Date   CHOLHDL 2.8  06/28/2020   Lab Results  Component Value Date   HGBA1C 5.9 (H) 06/28/2020      Assessment & Plan:  1. Essential hypertension, benign Well controlled.  No changes to medicines.  Continue to work on eating a healthy diet and  exercise.  Labs drawn today.  - CBC - Comprehensive metabolic panel  2. Mixed hyperlipidemia No changes to medicines.  Continue to work on eating a healthy diet and exercise.  Labs drawn today.  - Lipid panel  3. Impaired fasting glucose Low sugar diet. - Hemoglobin A1c  4. Aortic atherosclerosis: Recommend change pravastatin to crestor 40 mg once daily.   Follow-up: Return in about 3 months (around 09/28/2020) for fasting.    Rochel Brome, MD

## 2020-06-28 NOTE — Patient Instructions (Signed)
Try flora q for a month to see if helps.  May decrease dose of miralax 1/2 dose daily.

## 2020-06-29 LAB — COMPREHENSIVE METABOLIC PANEL
ALT: 35 IU/L — ABNORMAL HIGH (ref 0–32)
AST: 34 IU/L (ref 0–40)
Albumin/Globulin Ratio: 1.9 (ref 1.2–2.2)
Albumin: 4.4 g/dL (ref 3.7–4.7)
Alkaline Phosphatase: 101 IU/L (ref 48–121)
BUN/Creatinine Ratio: 13 (ref 12–28)
BUN: 17 mg/dL (ref 8–27)
Bilirubin Total: 0.4 mg/dL (ref 0.0–1.2)
CO2: 27 mmol/L (ref 20–29)
Calcium: 9.6 mg/dL (ref 8.7–10.3)
Chloride: 99 mmol/L (ref 96–106)
Creatinine, Ser: 1.33 mg/dL — ABNORMAL HIGH (ref 0.57–1.00)
GFR calc Af Amer: 45 mL/min/{1.73_m2} — ABNORMAL LOW (ref >59)
GFR calc non Af Amer: 39 mL/min/{1.73_m2} — ABNORMAL LOW (ref >59)
Globulin, Total: 2.3 g/dL (ref 1.5–4.5)
Glucose: 102 mg/dL — ABNORMAL HIGH (ref 65–99)
Potassium: 3.9 mmol/L (ref 3.5–5.2)
Sodium: 140 mmol/L (ref 134–144)
Total Protein: 6.7 g/dL (ref 6.0–8.5)

## 2020-06-29 LAB — LIPID PANEL
Chol/HDL Ratio: 2.8 ratio (ref 0.0–4.4)
Cholesterol, Total: 151 mg/dL (ref 100–199)
HDL: 53 mg/dL (ref >39)
LDL Chol Calc (NIH): 64 mg/dL (ref 0–99)
Triglycerides: 210 mg/dL — ABNORMAL HIGH (ref 0–149)
VLDL Cholesterol Cal: 34 mg/dL (ref 5–40)

## 2020-06-29 LAB — CBC WITH DIFFERENTIAL/PLATELET
Basophils Absolute: 0.1 10*3/uL (ref 0.0–0.2)
Basos: 1 %
EOS (ABSOLUTE): 0.4 10*3/uL (ref 0.0–0.4)
Eos: 4 %
Hematocrit: 41.5 % (ref 34.0–46.6)
Hemoglobin: 14.3 g/dL (ref 11.1–15.9)
Immature Grans (Abs): 0 10*3/uL (ref 0.0–0.1)
Immature Granulocytes: 0 %
Lymphocytes Absolute: 2.7 10*3/uL (ref 0.7–3.1)
Lymphs: 29 %
MCH: 31.2 pg (ref 26.6–33.0)
MCHC: 34.5 g/dL (ref 31.5–35.7)
MCV: 90 fL (ref 79–97)
Monocytes Absolute: 0.6 10*3/uL (ref 0.1–0.9)
Monocytes: 7 %
Neutrophils Absolute: 5.5 10*3/uL (ref 1.4–7.0)
Neutrophils: 59 %
Platelets: 197 10*3/uL (ref 150–450)
RBC: 4.59 x10E6/uL (ref 3.77–5.28)
RDW: 12.8 % (ref 11.7–15.4)
WBC: 9.3 10*3/uL (ref 3.4–10.8)

## 2020-06-29 LAB — CARDIOVASCULAR RISK ASSESSMENT

## 2020-06-29 LAB — HEMOGLOBIN A1C
Est. average glucose Bld gHb Est-mCnc: 123 mg/dL
Hgb A1c MFr Bld: 5.9 % — ABNORMAL HIGH (ref 4.8–5.6)

## 2020-07-02 ENCOUNTER — Encounter: Payer: Self-pay | Admitting: Family Medicine

## 2020-07-20 DIAGNOSIS — H18523 Epithelial (juvenile) corneal dystrophy, bilateral: Secondary | ICD-10-CM | POA: Diagnosis not present

## 2020-07-20 DIAGNOSIS — H18453 Nodular corneal degeneration, bilateral: Secondary | ICD-10-CM | POA: Diagnosis not present

## 2020-07-20 DIAGNOSIS — H524 Presbyopia: Secondary | ICD-10-CM | POA: Diagnosis not present

## 2020-08-09 ENCOUNTER — Other Ambulatory Visit: Payer: Self-pay

## 2020-08-09 ENCOUNTER — Ambulatory Visit (INDEPENDENT_AMBULATORY_CARE_PROVIDER_SITE_OTHER): Payer: Medicare Other | Admitting: Family Medicine

## 2020-08-09 VITALS — BP 112/68 | HR 76 | Temp 98.5°F | Resp 14 | Ht 66.0 in | Wt 150.0 lb

## 2020-08-09 DIAGNOSIS — M25572 Pain in left ankle and joints of left foot: Secondary | ICD-10-CM | POA: Diagnosis not present

## 2020-08-09 DIAGNOSIS — M79671 Pain in right foot: Secondary | ICD-10-CM

## 2020-08-09 DIAGNOSIS — M79672 Pain in left foot: Secondary | ICD-10-CM | POA: Diagnosis not present

## 2020-08-09 DIAGNOSIS — M25571 Pain in right ankle and joints of right foot: Secondary | ICD-10-CM

## 2020-08-09 MED ORDER — COLCHICINE 0.6 MG PO CAPS: 1.0000 | ORAL_CAPSULE | Freq: Two times a day (BID) | ORAL | 0 refills | Status: DC

## 2020-08-10 LAB — CBC WITH DIFFERENTIAL/PLATELET
Basophils Absolute: 0 10*3/uL (ref 0.0–0.2)
Basos: 0 %
EOS (ABSOLUTE): 0.1 10*3/uL (ref 0.0–0.4)
Eos: 1 %
Hematocrit: 42.3 % (ref 34.0–46.6)
Hemoglobin: 14.1 g/dL (ref 11.1–15.9)
Immature Grans (Abs): 0 10*3/uL (ref 0.0–0.1)
Immature Granulocytes: 0 %
Lymphocytes Absolute: 2.1 10*3/uL (ref 0.7–3.1)
Lymphs: 15 %
MCH: 30 pg (ref 26.6–33.0)
MCHC: 33.3 g/dL (ref 31.5–35.7)
MCV: 90 fL (ref 79–97)
Monocytes Absolute: 1.3 10*3/uL — ABNORMAL HIGH (ref 0.1–0.9)
Monocytes: 9 %
Neutrophils Absolute: 10.6 10*3/uL — ABNORMAL HIGH (ref 1.4–7.0)
Neutrophils: 75 %
Platelets: 193 10*3/uL (ref 150–450)
RBC: 4.7 x10E6/uL (ref 3.77–5.28)
RDW: 12.4 % (ref 11.7–15.4)
WBC: 14.2 10*3/uL — ABNORMAL HIGH (ref 3.4–10.8)

## 2020-08-10 LAB — URIC ACID: Uric Acid: 8.1 mg/dL — ABNORMAL HIGH (ref 3.1–7.9)

## 2020-08-12 ENCOUNTER — Encounter: Payer: Self-pay | Admitting: Family Medicine

## 2020-08-12 ENCOUNTER — Ambulatory Visit (INDEPENDENT_AMBULATORY_CARE_PROVIDER_SITE_OTHER): Payer: Medicare Other | Admitting: Nurse Practitioner

## 2020-08-12 ENCOUNTER — Other Ambulatory Visit: Payer: Self-pay

## 2020-08-12 ENCOUNTER — Encounter: Payer: Self-pay | Admitting: Nurse Practitioner

## 2020-08-12 VITALS — BP 114/66 | HR 81 | Temp 97.7°F | Wt 143.8 lb

## 2020-08-12 DIAGNOSIS — L03116 Cellulitis of left lower limb: Secondary | ICD-10-CM | POA: Diagnosis not present

## 2020-08-12 DIAGNOSIS — M10072 Idiopathic gout, left ankle and foot: Secondary | ICD-10-CM

## 2020-08-12 DIAGNOSIS — M79672 Pain in left foot: Secondary | ICD-10-CM | POA: Diagnosis not present

## 2020-08-12 DIAGNOSIS — M25572 Pain in left ankle and joints of left foot: Secondary | ICD-10-CM | POA: Diagnosis not present

## 2020-08-12 DIAGNOSIS — R936 Abnormal findings on diagnostic imaging of limbs: Secondary | ICD-10-CM | POA: Diagnosis not present

## 2020-08-12 DIAGNOSIS — Z9889 Other specified postprocedural states: Secondary | ICD-10-CM | POA: Diagnosis not present

## 2020-08-12 MED ORDER — CEPHALEXIN 500 MG PO CAPS: 500.0000 mg | ORAL_CAPSULE | Freq: Two times a day (BID) | ORAL | 0 refills | Status: AC

## 2020-08-12 NOTE — Progress Notes (Signed)
Established Patient Office Visit  Subjective:  Patient ID: Pamela Schwartz, female    DOB: [birth year]  Age: 79 y.o. MRN: [id number]  CC:  Chief Complaint  Patient presents with  . Foot Pain    Right Foot    HPI Pamela Schwartz presents for evaluation of right foot pain. She was seen in the clinic on 08/09/20 for gout of right foot pain.She was prescribed Colchicine. Her Uric Acid was 8.1 per labs. Today Pamela Schwartz states her pain is 10/10, red and hot to the touch. She has taken Tramadol for pain. She stated she had a bunionectomy approximately 12 years ago but cannot recall if she has retained hardware.  Past Medical History:  Diagnosis Date  . Age-related osteoporosis without current pathological fracture   . Arthritis   . Barrett's esophagus without dysplasia   . Cancer [facility])    denies  . Chronic kidney disease   . GERD (gastroesophageal reflux disease)   . Hearing loss   . History of COVID-19   . Hypercholesterolemia   . Hypertension   . Hypertensive chronic kidney disease with stage 1 through stage 4 chronic kidney disease, or unspecified chronic kidney disease   . IBS (irritable bowel syndrome)   . Impaired fasting glucose   . Low back pain   . Mixed hyperlipidemia   . Paresthesia of skin   . Primary insomnia   . Scars   . Vitamin D deficiency, unspecified     Past Surgical History:  Procedure Laterality Date  . ANTERIOR CERVICAL DECOMP/DISCECTOMY FUSION N/A 03/06/2016   Procedure: ACDF - C5-C6 - C6-C7  ;  Surgeon: Earnie Larsson, MD;  Location: [place] [facility];  Service: Neurosurgery;  Laterality: N/A;  ACDF - C5-C6 - C6-C7    . APPENDECTOMY    . BUNIONECTOMY     right and left  . CARDIAC CATHETERIZATION     had in [place] around 2012  . CATARACT EXTRACTION W/ INTRAOCULAR LENS  IMPLANT, BILATERAL    . CESAREAN SECTION    . COLONOSCOPY W/ BIOPSIES AND POLYPECTOMY    . MULTIPLE TOOTH EXTRACTIONS    . TUBAL LIGATION      Family History  Problem Relation Age of Onset    . Heart attack Mother   . Heart attack Father   . Hypertension Sister   . Hypertension Brother   . Renal Disease Brother   . Heart attack Brother   . Multiple myeloma Brother   . Hypertension Sister   . Hypertension Sister   . Heart attack Sister     Social History   Socioeconomic History  . Marital status: Married    Spouse name: Not on file  . Number of children: 3  . Years of education: Not on file  . Highest education level: Not on file  Occupational History  . Occupation: Retired Therapist, sports  Tobacco Use  . Smoking status: Former Smoker    Packs/day: 0.50    Types: Cigarettes    Quit date: 10/02/2015    Years since quitting: 4.8  . Smokeless tobacco: Never Used  Vaping Use  . Vaping Use: Never used  Substance and Sexual Activity  . Alcohol use: No  . Drug use: No  . Sexual activity: Not on file  Other Topics Concern  . Not on file  Social History Narrative  . Not on file   Social Determinants of Health   Financial Resource Strain:   . Difficulty of Paying Living Expenses: Not on file  Food Insecurity:   . Worried About Charity fundraiser in the Last Year: Not on file  . Ran Out of Food in the Last Year: Not on file  Transportation Needs:   . Lack of Transportation (Medical): Not on file  . Lack of Transportation (Non-Medical): Not on file  Physical Activity:   . Days of Exercise per Week: Not on file  . Minutes of Exercise per Session: Not on file  Stress:   . Feeling of Stress : Not on file  Social Connections:   . Frequency of Communication with Friends and Family: Not on file  . Frequency of Social Gatherings with Friends and Family: Not on file  . Attends Religious Services: Not on file  . Active Member of Clubs or Organizations: Not on file  . Attends Archivist Meetings: Not on file  . Marital Status: Not on file  Intimate Partner Violence:   . Fear of Current or Ex-Partner: Not on file  . Emotionally Abused: Not on file  . Physically  Abused: Not on file  . Sexually Abused: Not on file    Outpatient Medications Prior to Visit  Medication Sig Dispense Refill  . amLODipine (NORVASC) 10 MG tablet Take 1 tablet (10 mg total) by mouth daily. 90 tablet 3  . aspirin 81 MG tablet Take 81 mg by mouth daily.    . chlorthalidone (HYGROTON) 25 MG tablet TAKE ONE TABLET BY MOUTH EVERY DAY 90 tablet 3  . Colchicine (MITIGARE) 0.6 MG CAPS Take 1 capsule by mouth in the morning and at bedtime. 14 capsule 0  . metoprolol succinate (TOPROL-XL) 100 MG 24 hr tablet Take 3 tablets (300 mg total) by mouth daily. Take with or immediately following a meal. 270 tablet 1  . pantoprazole (PROTONIX) 40 MG tablet Take 1 tablet(s) by mouth daily 90 tablet 3  . promethazine (PHENERGAN) 25 MG tablet take 1 tablet (25 mg) by oral route 4 times per day prn nausea/vomiting 60 tablet 3  . rosuvastatin (CRESTOR) 40 MG tablet Take 1 tablet (40 mg total) by mouth daily. 90 tablet 0  . traMADol (ULTRAM) 50 MG tablet TAKE ONE TABLET BY MOUTH EVERY 8 HOURS AS NEEDED FOR BACK PAIN 90 tablet 2  . Vitamin D, Ergocalciferol, (DRISDOL) 1.25 MG (50000 UNIT) CAPS capsule TAKE ONE CAPSULE BY MOUTH ONCE WEEKLY 12 capsule 3  . zolpidem (AMBIEN CR) 12.5 MG CR tablet Take 1 tablet (12.5 mg total) by mouth at bedtime. 30 tablet 5  . colchicine 0.6 MG tablet SMARTSIG:1 Tablet(s) By Mouth Morning-Evening     No facility-administered medications prior to visit.    Allergies  Allergen Reactions  . Clonidine Derivatives Other (See Comments)  . Dexilant [Dexlansoprazole] Diarrhea  . Hydralazine   . Nickel   . Tape Other (See Comments)    adhesive adhesive  . Tizanidine Hcl Other (See Comments)    Somnolence  . Zantac [Ranitidine Hcl]   . Chantix [Varenicline] Rash    ROS Review of Systems  Constitutional: Negative for fever.  Respiratory: Negative for cough and shortness of breath.   Cardiovascular: Negative for chest pain.  Gastrointestinal: Negative for diarrhea  and vomiting.  Musculoskeletal: Positive for arthralgias and gait problem.       Right foot pain, erythema, and swelling  Neurological: Negative for headaches.      Objective:    Physical Exam Cardiovascular:     Rate and Rhythm: Normal rate and regular rhythm.  Pulses: Normal pulses.          Dorsalis pedis pulses are 2+ on the right side and 2+ on the left side.       Posterior tibial pulses are 2+ on the right side and 2+ on the left side.     Heart sounds: Normal heart sounds.  Feet:     Right foot:     Skin integrity: Erythema and warmth present.     Toenail Condition: Right toenails are normal.     Comments: Warmth and erythema noted to medial and lateral aspect of right foot, radiating toward ankle. Tender to touch. Neurological:     Mental Status: She is alert.     BP 114/66 (BP Location: Left Arm, Patient Position: Sitting, Cuff Size: Small)   Pulse 81   Temp 97.7 F (36.5 C) (Temporal)   Wt 143 lb 12.8 oz (65.2 kg)   SpO2 99%   BMI 23.21 kg/m  Wt Readings from Last 3 Encounters:  08/12/20 143 lb 12.8 oz (65.2 kg)  08/09/20 150 lb (68 kg)  06/28/20 148 lb (67.1 kg)     Health Maintenance Due  Topic Date Due  . Hepatitis C Screening  Never done  . MAMMOGRAM  10/30/2019  . INFLUENZA VACCINE  06/12/2020    There are no preventive care reminders to display for this patient.  Lab Results  Component Value Date   TSH 1.120 03/02/2020   Lab Results  Component Value Date   WBC 14.2 (H) 08/09/2020   HGB 14.1 08/09/2020   HCT 42.3 08/09/2020   MCV 90 08/09/2020   PLT 193 08/09/2020   Lab Results  Component Value Date   NA 140 06/28/2020   K 3.9 06/28/2020   CO2 27 06/28/2020   GLUCOSE 102 (H) 06/28/2020   BUN 17 06/28/2020   CREATININE 1.33 (H) 06/28/2020   BILITOT 0.4 06/28/2020   ALKPHOS 101 06/28/2020   AST 34 06/28/2020   ALT 35 (H) 06/28/2020   PROT 6.7 06/28/2020   ALBUMIN 4.4 06/28/2020   CALCIUM 9.6 06/28/2020   ANIONGAP 12  03/06/2016   Lab Results  Component Value Date   CHOL 151 06/28/2020   Lab Results  Component Value Date   HDL 53 06/28/2020   Lab Results  Component Value Date   LDLCALC 64 06/28/2020   Lab Results  Component Value Date   TRIG 210 (H) 06/28/2020   Lab Results  Component Value Date   CHOLHDL 2.8 06/28/2020   Lab Results  Component Value Date   HGBA1C 5.9 (H) 06/28/2020      Assessment & Plan:   1. Cellulitis of left foot Keflex 500 mg twice daily for 7 days  2. Acute idiopathic gout of left foot Continue Colchicine for gout as prescribed     Rest,ice, and elevate right foot as tolerated  Follow-up: Return if symptoms worsen or fail to improve.    Rip Harbour, NP

## 2020-08-12 NOTE — Patient Instructions (Signed)
Rest and elevate left foot. Apply cold compress 20 minutes on, 20 minutes off as tolerated. Take Tylenol over-there-counter as needed for pain. Call clinic if symptoms fail to improve in 48 hours  Cellulitis, Adult  Cellulitis is a skin infection. The infected area is often warm, red, swollen, and sore. It occurs most often in the arms and lower legs. It is very important to get treated for this condition. What are the causes? This condition is caused by bacteria. The bacteria enter through a break in the skin, such as a cut, burn, insect bite, open sore, or crack. What increases the risk? This condition is more likely to occur in people who:  Have a weak body defense system (immune system).  Have open cuts, burns, bites, or scrapes on the skin.  Are older than 77 years of age.  Have a blood sugar problem (diabetes).  Have a long-lasting (chronic) liver disease (cirrhosis) or kidney disease.  Are very overweight (obese).  Have a skin problem, such as: ? Itchy rash (eczema). ? Slow movement of blood in the veins (venous stasis). ? Fluid buildup below the skin (edema).  Have been treated with high-energy rays (radiation).  Use IV drugs. What are the signs or symptoms? Symptoms of this condition include:  Skin that is: ? Red. ? Streaking. ? Spotting. ? Swollen. ? Sore or painful when you touch it. ? Warm.  A fever.  Chills.  Blisters. How is this diagnosed? This condition is diagnosed based on:  Medical history.  Physical exam.  Blood tests.  Imaging tests. How is this treated? Treatment for this condition may include:  Medicines to treat infections or allergies.  Home care, such as: ? Rest. ? Placing cold or warm cloths (compresses) on the skin.  Hospital care, if the condition is very bad. Follow these instructions at home: Medicines  Take over-the-counter and prescription medicines only as told by your doctor.  If you were prescribed an antibiotic  medicine, take it as told by your doctor. Do not stop taking it even if you start to feel better. General instructions   Drink enough fluid to keep your pee (urine) pale yellow.  Do not touch or rub the infected area.  Raise (elevate) the infected area above the level of your heart while you are sitting or lying down.  Place cold or warm cloths on the area as told by your doctor.  Keep all follow-up visits as told by your doctor. This is important. Contact a doctor if:  You have a fever.  You do not start to get better after 1-2 days of treatment.  Your bone or joint under the infected area starts to hurt after the skin has healed.  Your infection comes back. This can happen in the same area or another area.  You have a swollen bump in the area.  You have new symptoms.  You feel ill and have muscle aches and pains. Get help right away if:  Your symptoms get worse.  You feel very sleepy.  You throw up (vomit) or have watery poop (diarrhea) for a long time.  You see red streaks coming from the area.  Your red area gets larger.  Your red area turns dark in color. These symptoms may represent a serious problem that is an emergency. Do not wait to see if the symptoms will go away. Get medical help right away. Call your local emergency services (911 in the [place] Do not drive yourself to the hospital.  Summary  Cellulitis is a skin infection. The area is often warm, red, swollen, and sore.  This condition is treated with medicines, rest, and cold and warm cloths.  Take all medicines only as told by your doctor.  Tell your doctor if symptoms do not start to get better after 1-2 days of treatment. This information is not intended to replace advice given to you by your health care provider. Make sure you discuss any questions you have with your health care provider. Document Revised: 03/20/2018 Document Reviewed: 03/20/2018 Elsevier Patient Education  [place].  Cephalexin Tablets or Capsules What is this medicine? CEPHALEXIN (sef a LEX in) is a cephalosporin antibiotic. It treats some infections caused by bacteria. It will not work for colds, the flu, or other viruses. This medicine may be used for other purposes; ask your health care provider or pharmacist if you have questions. COMMON BRAND NAME(S): Biocef, Daxbia, Keflex, Keftab What should I tell my health care provider before I take this medicine? They need to know if you have any of these conditions:  kidney disease  stomach or intestine problems, especially colitis  an unusual or allergic reaction to cephalexin, other cephalosporins, penicillins, other antibiotics, medicines, foods, dyes or preservatives  pregnant or trying to get pregnant  breast-feeding How should I use this medicine? Take this drug by mouth. Take it as directed on the prescription label at the same time every day. You can take it with or without food. If it upsets your stomach, take it with food. Take all of this drug unless your health care provider tells you to stop it early. Keep taking it even if you think you are better. Talk to your health care provider about the use of this drug in children. While it may be prescribed for selected conditions, precautions do apply. Overdosage: If you think you have taken too much of this medicine contact a poison control center or emergency room at once. NOTE: This medicine is only for you. Do not share this medicine with others. What if I miss a dose? If you miss a dose, take it as soon as you can. If it is almost time for your next dose, take only that dose. Do not take double or extra doses. What may interact with this medicine?  probenecid  some other antibiotics This list may not describe all possible interactions. Give your health care provider a list of all the medicines, herbs, non-prescription drugs, or dietary supplements you use. Also tell them if you smoke,  drink alcohol, or use illegal drugs. Some items may interact with your medicine. What should I watch for while using this medicine? Tell your doctor or health care provider if your symptoms do not begin to improve in a few days. This medicine may cause serious skin reactions. They can happen weeks to months after starting the medicine. Contact your health care provider right away if you notice fevers or flu-like symptoms with a rash. The rash may be red or purple and then turn into blisters or peeling of the skin. Or, you might notice a red rash with swelling of the face, lips or lymph nodes in your neck or under your arms. Do not treat diarrhea with over the counter products. Contact your doctor if you have diarrhea that lasts more than 2 days or if it is severe and watery. If you have diabetes, you may get a false-positive result for sugar in your urine. Check with your doctor or health  care provider. What side effects may I notice from receiving this medicine? Side effects that you should report to your doctor or health care professional as soon as possible:  allergic reactions like skin rash, itching or hives, swelling of the face, lips, or tongue  breathing problems  pain or trouble passing urine  redness, blistering, peeling or loosening of the skin, including inside the mouth  severe or watery diarrhea  unusually weak or tired  yellowing of the eyes, skin Side effects that usually do not require medical attention (report to your doctor or health care professional if they continue or are bothersome):  gas or heartburn  genital or anal irritation  headache  joint or muscle pain  nausea, vomiting This list may not describe all possible side effects. Call your doctor for medical advice about side effects. You may report side effects to FDA at 1-800-FDA-1088. Where should I keep my medicine? Keep out of the reach of children and pets. Store at room temperature between 20 and 25  degrees C (68 and 77 degrees F). Throw away any unused drug after the expiration date. NOTE: This sheet is a summary. It may not cover all possible information. If you have questions about this medicine, talk to your doctor, pharmacist, or health care provider.  2020 Elsevier/Gold Standard (2019-06-05 11:27:00)

## 2020-08-14 ENCOUNTER — Encounter: Payer: Self-pay | Admitting: Family Medicine

## 2020-08-14 NOTE — Progress Notes (Addendum)
Acute Office Visit  Subjective:    Patient ID: Pamela Schwartz, female    DOB: October 22, 1941, 79 y.o.   MRN: [id number]  Chief Complaint  Patient presents with  . Foot Pain    left     HPI Patient is in today for right foot pain x 4 days. Increased redness, warmth, and pain. No fever.   Past Medical History:  Diagnosis Date  . Age-related osteoporosis without current pathological fracture   . Arthritis   . Barrett's esophagus without dysplasia   . Cancer [facility])    denies  . Chronic kidney disease   . GERD (gastroesophageal reflux disease)   . Hearing loss   . History of COVID-19   . Hypercholesterolemia   . Hypertension   . Hypertensive chronic kidney disease with stage 1 through stage 4 chronic kidney disease, or unspecified chronic kidney disease   . IBS (irritable bowel syndrome)   . Impaired fasting glucose   . Low back pain   . Mixed hyperlipidemia   . Paresthesia of skin   . Primary insomnia   . Scars   . Vitamin D deficiency, unspecified     Past Surgical History:  Procedure Laterality Date  . ANTERIOR CERVICAL DECOMP/DISCECTOMY FUSION N/A 03/06/2016   Procedure: ACDF - C5-C6 - C6-C7  ;  Surgeon: Earnie Larsson, MD;  Location: [facility];  Service: Neurosurgery;  Laterality: N/A;  ACDF - C5-C6 - C6-C7    . APPENDECTOMY    . BUNIONECTOMY     right and left  . CARDIAC CATHETERIZATION     had in [place] around 2012  . CATARACT EXTRACTION W/ INTRAOCULAR LENS  IMPLANT, BILATERAL    . CESAREAN SECTION    . COLONOSCOPY W/ BIOPSIES AND POLYPECTOMY    . MULTIPLE TOOTH EXTRACTIONS    . TUBAL LIGATION      Family History  Problem Relation Age of Onset  . Heart attack Mother   . Heart attack Father   . Hypertension Sister   . Hypertension Brother   . Renal Disease Brother   . Heart attack Brother   . Multiple myeloma Brother   . Hypertension Sister   . Hypertension Sister   . Heart attack Sister     Social History   Socioeconomic History  . Marital  status: Married    Spouse name: Not on file  . Number of children: 3  . Years of education: Not on file  . Highest education level: Not on file  Occupational History  . Occupation: Retired Therapist, sports  Tobacco Use  . Smoking status: Former Smoker    Packs/day: 0.50    Types: Cigarettes    Quit date: 10/02/2015    Years since quitting: 4.8  . Smokeless tobacco: Never Used  Vaping Use  . Vaping Use: Never used  Substance and Sexual Activity  . Alcohol use: No  . Drug use: No  . Sexual activity: Not on file  Other Topics Concern  . Not on file  Social History Narrative  . Not on file   Social Determinants of Health   Financial Resource Strain:   . Difficulty of Paying Living Expenses: Not on file  Food Insecurity:   . Worried About Charity fundraiser in the Last Year: Not on file  . Ran Out of Food in the Last Year: Not on file  Transportation Needs:   . Lack of Transportation (Medical): Not on file  . Lack of Transportation (Non-Medical): Not on  file  Physical Activity:   . Days of Exercise per Week: Not on file  . Minutes of Exercise per Session: Not on file  Stress:   . Feeling of Stress : Not on file  Social Connections:   . Frequency of Communication with Friends and Family: Not on file  . Frequency of Social Gatherings with Friends and Family: Not on file  . Attends Religious Services: Not on file  . Active Member of Clubs or Organizations: Not on file  . Attends Archivist Meetings: Not on file  . Marital Status: Not on file  Intimate Partner Violence:   . Fear of Current or Ex-Partner: Not on file  . Emotionally Abused: Not on file  . Physically Abused: Not on file  . Sexually Abused: Not on file    Outpatient Medications Prior to Visit  Medication Sig Dispense Refill  . amLODipine (NORVASC) 10 MG tablet Take 1 tablet (10 mg total) by mouth daily. 90 tablet 3  . aspirin 81 MG tablet Take 81 mg by mouth daily.    . chlorthalidone (HYGROTON) 25 MG tablet  TAKE ONE TABLET BY MOUTH EVERY DAY 90 tablet 3  . metoprolol succinate (TOPROL-XL) 100 MG 24 hr tablet Take 3 tablets (300 mg total) by mouth daily. Take with or immediately following a meal. 270 tablet 1  . pantoprazole (PROTONIX) 40 MG tablet Take 1 tablet(s) by mouth daily 90 tablet 3  . promethazine (PHENERGAN) 25 MG tablet take 1 tablet (25 mg) by oral route 4 times per day prn nausea/vomiting 60 tablet 3  . rosuvastatin (CRESTOR) 40 MG tablet Take 1 tablet (40 mg total) by mouth daily. 90 tablet 0  . traMADol (ULTRAM) 50 MG tablet TAKE ONE TABLET BY MOUTH EVERY 8 HOURS AS NEEDED FOR BACK PAIN 90 tablet 2  . Vitamin D, Ergocalciferol, (DRISDOL) 1.25 MG (50000 UNIT) CAPS capsule TAKE ONE CAPSULE BY MOUTH ONCE WEEKLY 12 capsule 3  . zolpidem (AMBIEN CR) 12.5 MG CR tablet Take 1 tablet (12.5 mg total) by mouth at bedtime. 30 tablet 5   No facility-administered medications prior to visit.    Allergies  Allergen Reactions  . Clonidine Derivatives Other (See Comments)  . Dexilant [Dexlansoprazole] Diarrhea  . Hydralazine   . Nickel   . Tape Other (See Comments)    adhesive adhesive  . Tizanidine Hcl Other (See Comments)    Somnolence  . Zantac [Ranitidine Hcl]   . Chantix [Varenicline] Rash    Review of Systems  Constitutional: Positive for fever.  Unable to walk on her foot.     Objective:    Physical Exam Vitals reviewed.  Constitutional:      Appearance: Normal appearance.  Musculoskeletal:        General: Swelling and tenderness (Right great toe and right ankle.) present.  Skin:    Findings: Erythema present.  Neurological:     Mental Status: She is alert.     BP 112/68   Pulse 76   Temp 98.5 F (36.9 C)   Resp 14   Ht _0  (1.676 m)   Wt 150 lb (68 kg)   BMI 24.21 kg/m  Wt Readings from Last 3 Encounters:  08/12/20 143 lb 12.8 oz (65.2 kg)  08/09/20 150 lb (68 kg)  06/28/20 148 lb (67.1 kg)    Health Maintenance Due  Topic Date Due  . Hepatitis C  Screening  Never done  . MAMMOGRAM  10/30/2019  . INFLUENZA VACCINE  06/12/2020    There are no preventive care reminders to display for this patient.   Lab Results  Component Value Date   TSH 1.120 03/02/2020   Lab Results  Component Value Date   WBC 14.2 (H) 08/09/2020   HGB 14.1 08/09/2020   HCT 42.3 08/09/2020   MCV 90 08/09/2020   PLT 193 08/09/2020   Lab Results  Component Value Date   NA 140 06/28/2020   K 3.9 06/28/2020   CO2 27 06/28/2020   GLUCOSE 102 (H) 06/28/2020   BUN 17 06/28/2020   CREATININE 1.33 (H) 06/28/2020   BILITOT 0.4 06/28/2020   ALKPHOS 101 06/28/2020   AST 34 06/28/2020   ALT 35 (H) 06/28/2020   PROT 6.7 06/28/2020   ALBUMIN 4.4 06/28/2020   CALCIUM 9.6 06/28/2020   ANIONGAP 12 03/06/2016   Lab Results  Component Value Date   CHOL 151 06/28/2020   Lab Results  Component Value Date   HDL 53 06/28/2020   Lab Results  Component Value Date   LDLCALC 64 06/28/2020   Lab Results  Component Value Date   TRIG 210 (H) 06/28/2020   Lab Results  Component Value Date   CHOLHDL 2.8 06/28/2020   Lab Results  Component Value Date   HGBA1C 5.9 (H) 06/28/2020       Assessment & Plan:  1. Foot pain, right - CBC with Differential/Platelet - Uric acid - Colchicine (MITIGARE) 0.6 MG CAPS; Take 1 capsule by mouth in the morning and at bedtime.  Dispense: 14 capsule; Refill: 0 - Order left foot xray  2. Acute right ankle pain  - Order left ankle xray  Meds ordered this encounter  Medications  . Colchicine (MITIGARE) 0.6 MG CAPS    Sig: Take 1 capsule by mouth in the morning and at bedtime.    Dispense:  14 capsule    Refill:  0    Orders Placed This Encounter  Procedures  . CBC with Differential/Platelet  . Uric acid     Follow-up: Return in about 1 week (around 08/16/2020) for GOUT.  An After Visit Summary was printed and given to the patient.  Rochel Brome [facility] [phone]

## 2020-08-15 ENCOUNTER — Ambulatory Visit: Payer: Medicare Other

## 2020-08-17 ENCOUNTER — Encounter: Payer: Self-pay | Admitting: Family Medicine

## 2020-08-17 ENCOUNTER — Other Ambulatory Visit: Payer: Self-pay

## 2020-08-17 ENCOUNTER — Ambulatory Visit (INDEPENDENT_AMBULATORY_CARE_PROVIDER_SITE_OTHER): Payer: Medicare Other | Admitting: Family Medicine

## 2020-08-17 VITALS — BP 118/68 | HR 74 | Temp 97.2°F | Ht 66.0 in | Wt 144.0 lb

## 2020-08-17 DIAGNOSIS — M1A0411 Idiopathic chronic gout, right hand, with tophus (tophi): Secondary | ICD-10-CM | POA: Diagnosis not present

## 2020-08-17 DIAGNOSIS — Z23 Encounter for immunization: Secondary | ICD-10-CM | POA: Diagnosis not present

## 2020-08-17 DIAGNOSIS — M1A0710 Idiopathic chronic gout, right ankle and foot, without tophus (tophi): Secondary | ICD-10-CM | POA: Diagnosis not present

## 2020-08-17 MED ORDER — COLCHICINE 0.6 MG PO TABS: 0.6000 mg | ORAL_TABLET | Freq: Every day | ORAL | 1 refills | Status: DC

## 2020-08-17 MED ORDER — COLCHICINE 0.6 MG PO CAPS: 1.0000 | ORAL_CAPSULE | Freq: Every day | ORAL | 1 refills | Status: DC

## 2020-08-17 MED ORDER — ALLOPURINOL 100 MG PO TABS: 50.0000 mg | ORAL_TABLET | Freq: Every day | ORAL | 3 refills | Status: DC

## 2020-08-17 NOTE — Patient Instructions (Signed)
Colchicine 0.6 mg once daily.  Allopurinol 100 mg 1/2 tablet daily.  Gout  Gout is painful swelling of your joints. Gout is a type of arthritis. It is caused by having too much uric acid in your body. Uric acid is a chemical that is made when your body breaks down substances called purines. If your body has too much uric acid, sharp crystals can form and build up in your joints. This causes pain and swelling. Gout attacks can happen quickly and be very painful (acute gout). Over time, the attacks can affect more joints and happen more often (chronic gout). What are the causes?  Too much uric acid in your blood. This can happen because: ? Your kidneys do not remove enough uric acid from your blood. ? Your body makes too much uric acid. ? You eat too many foods that are high in purines. These foods include organ meats, some seafood, and beer.  Trauma or stress. What increases the risk?  Having a family history of gout.  Being female and middle-aged.  Being female and having gone through menopause.  Being very overweight (obese).  Drinking alcohol, especially beer.  Not having enough water in the body (being dehydrated).  Losing weight too quickly.  Having an organ transplant.  Having lead poisoning.  Taking certain medicines.  Having kidney disease.  Having a skin condition called psoriasis. What are the signs or symptoms? An attack of acute gout usually happens in just one joint. The most common place is the big toe. Attacks often start at night. Other joints that may be affected include joints of the feet, ankle, knee, fingers, wrist, or elbow. Symptoms of an attack may include:  Very bad pain.  Warmth.  Swelling.  Stiffness.  Shiny, red, or purple skin.  Tenderness. The affected joint may be very painful to touch.  Chills and fever. Chronic gout may cause symptoms more often. More joints may be involved. You may also have white or yellow lumps (tophi) on your  hands or feet or in other areas near your joints. How is this treated?  Treatment for this condition has two phases: treating an acute attack and preventing future attacks.  Acute gout treatment may include: ? NSAIDs. ? Steroids. These are taken by mouth or injected into a joint. ? Colchicine. This medicine relieves pain and swelling. It can be given by mouth or through an IV tube.  Preventive treatment may include: ? Taking small doses of NSAIDs or colchicine daily. ? Using a medicine that reduces uric acid levels in your blood. ? Making changes to your diet. You may need to see a food expert (dietitian) about what to eat and drink to prevent gout. Follow these instructions at home: During a gout attack   If told, put ice on the painful area: ? Put ice in a plastic bag. ? Place a towel between your skin and the bag. ? Leave the ice on for 20 minutes, 2-3 times a day.  Raise (elevate) the painful joint above the level of your heart as often as you can.  Rest the joint as much as possible. If the joint is in your leg, you may be given crutches.  Follow instructions from your doctor about what you cannot eat or drink. Avoiding future gout attacks  Eat a low-purine diet. Avoid foods and drinks such as: ? Liver. ? Kidney. ? Anchovies. ? Asparagus. ? Herring. ? Mushrooms. ? Mussels. ? Beer.  Stay at a healthy weight. If  you want to lose weight, talk with your doctor. Do not lose weight too fast.  Start or continue an exercise plan as told by your doctor. Eating and drinking  Drink enough fluids to keep your pee (urine) pale yellow.  If you drink alcohol: ? Limit how much you use to:  0-1 drink a day for women.  0-2 drinks a day for men. ? Be aware of how much alcohol is in your drink. In the [place] one drink equals one 12 oz bottle of beer (355 mL), one 5 oz glass of wine (148 mL), or one 1 oz glass of hard liquor (44 mL). General instructions  Take over-the-counter  and prescription medicines only as told by your doctor.  Do not drive or use heavy machinery while taking prescription pain medicine.  Return to your normal activities as told by your doctor. Ask your doctor what activities are safe for you.  Keep all follow-up visits as told by your doctor. This is important. Contact a doctor if:  You have another gout attack.  You still have symptoms of a gout attack after 10 days of treatment.  You have problems (side effects) because of your medicines.  You have chills or a fever.  You have burning pain when you pee (urinate).  You have pain in your lower back or belly. Get help right away if:  You have very bad pain.  Your pain cannot be controlled.  You cannot pee. Summary  Gout is painful swelling of the joints.  The most common site of pain is the big toe, but it can affect other joints.  Medicines and avoiding some foods can help to prevent and treat gout attacks. This information is not intended to replace advice given to you by your health care provider. Make sure you discuss any questions you have with your health care provider. Document Revised: 05/21/2018 Document Reviewed: 05/21/2018 Elsevier Patient Education  [place].

## 2020-08-17 NOTE — Progress Notes (Signed)
Acute Office Visit  Subjective:    Patient ID: Pamela Schwartz, female    DOB: [birth year], 79 y.o.   MRN: [id number]  Chief Complaint  Patient presents with  . Foot Pain    HPI Patient is in today for follow up of gout and cellulitis of her right foot. Patient had increased redness, warmth, and pain. No fever. Today she states that has finished all of her cholchicine and has 2 more days of her antibiotic but she feels much better.  She still has some discomfort in her fingers, where there are clearly tophi.  Past Medical History:  Diagnosis Date  . Age-related osteoporosis without current pathological fracture   . Arthritis   . Barrett's esophagus without dysplasia   . Cancer [facility])    denies  . Chronic kidney disease   . GERD (gastroesophageal reflux disease)   . Hearing loss   . History of COVID-19   . Hypercholesterolemia   . Hypertension   . Hypertensive chronic kidney disease with stage 1 through stage 4 chronic kidney disease, or unspecified chronic kidney disease   . IBS (irritable bowel syndrome)   . Impaired fasting glucose   . Low back pain   . Mixed hyperlipidemia   . Paresthesia of skin   . Primary insomnia   . Scars   . Vitamin D deficiency, unspecified     Past Surgical History:  Procedure Laterality Date  . ANTERIOR CERVICAL DECOMP/DISCECTOMY FUSION N/A 03/06/2016   Procedure: ACDF - C5-C6 - C6-C7  ;  Surgeon: Earnie Larsson, MD;  Location: [place] [facility];  Service: Neurosurgery;  Laterality: N/A;  ACDF - C5-C6 - C6-C7    . APPENDECTOMY    . BUNIONECTOMY     right and left  . CARDIAC CATHETERIZATION     had in [place] around 2012  . CATARACT EXTRACTION W/ INTRAOCULAR LENS  IMPLANT, BILATERAL    . CESAREAN SECTION    . COLONOSCOPY W/ BIOPSIES AND POLYPECTOMY    . MULTIPLE TOOTH EXTRACTIONS    . TUBAL LIGATION      Family History  Problem Relation Age of Onset  . Heart attack Mother   . Heart attack Father   . Hypertension Sister   . Hypertension  Brother   . Renal Disease Brother   . Heart attack Brother   . Multiple myeloma Brother   . Hypertension Sister   . Hypertension Sister   . Heart attack Sister     Social History   Socioeconomic History  . Marital status: Married    Spouse name: Not on file  . Number of children: 3  . Years of education: Not on file  . Highest education level: Not on file  Occupational History  . Occupation: Retired Therapist, sports  Tobacco Use  . Smoking status: Former Smoker    Packs/day: 0.50    Types: Cigarettes    Quit date: 10/02/2015    Years since quitting: 4.8  . Smokeless tobacco: Never Used  Vaping Use  . Vaping Use: Never used  Substance and Sexual Activity  . Alcohol use: No  . Drug use: No  . Sexual activity: Not on file  Other Topics Concern  . Not on file  Social History Narrative  . Not on file   Social Determinants of Health   Financial Resource Strain:   . Difficulty of Paying Living Expenses: Not on file  Food Insecurity:   . Worried About Charity fundraiser in the Last Year:  Not on file  . Ran Out of Food in the Last Year: Not on file  Transportation Needs:   . Lack of Transportation (Medical): Not on file  . Lack of Transportation (Non-Medical): Not on file  Physical Activity:   . Days of Exercise per Week: Not on file  . Minutes of Exercise per Session: Not on file  Stress:   . Feeling of Stress : Not on file  Social Connections:   . Frequency of Communication with Friends and Family: Not on file  . Frequency of Social Gatherings with Friends and Family: Not on file  . Attends Religious Services: Not on file  . Active Member of Clubs or Organizations: Not on file  . Attends Archivist Meetings: Not on file  . Marital Status: Not on file  Intimate Partner Violence:   . Fear of Current or Ex-Partner: Not on file  . Emotionally Abused: Not on file  . Physically Abused: Not on file  . Sexually Abused: Not on file    Outpatient Medications Prior to  Visit  Medication Sig Dispense Refill  . amLODipine (NORVASC) 10 MG tablet Take 1 tablet (10 mg total) by mouth daily. 90 tablet 3  . aspirin 81 MG tablet Take 81 mg by mouth daily.    . cephALEXin (KEFLEX) 500 MG capsule Take 1 capsule (500 mg total) by mouth 2 (two) times daily for 7 days. 14 capsule 0  . chlorthalidone (HYGROTON) 25 MG tablet TAKE ONE TABLET BY MOUTH EVERY DAY 90 tablet 3  . metoprolol succinate (TOPROL-XL) 100 MG 24 hr tablet Take 3 tablets (300 mg total) by mouth daily. Take with or immediately following a meal. 270 tablet 1  . pantoprazole (PROTONIX) 40 MG tablet Take 1 tablet(s) by mouth daily 90 tablet 3  . promethazine (PHENERGAN) 25 MG tablet take 1 tablet (25 mg) by oral route 4 times per day prn nausea/vomiting 60 tablet 3  . rosuvastatin (CRESTOR) 40 MG tablet Take 1 tablet (40 mg total) by mouth daily. 90 tablet 0  . traMADol (ULTRAM) 50 MG tablet TAKE ONE TABLET BY MOUTH EVERY 8 HOURS AS NEEDED FOR BACK PAIN 90 tablet 2  . Vitamin D, Ergocalciferol, (DRISDOL) 1.25 MG (50000 UNIT) CAPS capsule TAKE ONE CAPSULE BY MOUTH ONCE WEEKLY 12 capsule 3  . zolpidem (AMBIEN CR) 12.5 MG CR tablet Take 1 tablet (12.5 mg total) by mouth at bedtime. 30 tablet 5  . Colchicine (MITIGARE) 0.6 MG CAPS Take 1 capsule by mouth in the morning and at bedtime. 14 capsule 0   No facility-administered medications prior to visit.    Allergies  Allergen Reactions  . Clonidine Derivatives Other (See Comments)  . Dexilant [Dexlansoprazole] Diarrhea  . Hydralazine   . Nickel   . Tape Other (See Comments)    adhesive adhesive  . Tizanidine Hcl Other (See Comments)    Somnolence  . Zantac [Ranitidine Hcl]   . Chantix [Varenicline] Rash    Review of Systems  Constitutional: Negative for chills, fatigue and fever.  HENT: Negative for congestion, ear pain, rhinorrhea and sore throat.   Respiratory: Negative for cough and shortness of breath.   Cardiovascular: Negative for chest pain.        Objective:    Physical Exam Vitals reviewed.  Constitutional:      Appearance: Normal appearance.  Musculoskeletal:        General: Swelling (Trace of her right foot) and tenderness (Significantly improved.  Still has some very  mild tenderness of her right great toe.  Also has some tenderness over her right second and third DIP joints.  Tophi are seen here.) present.  Neurological:     Mental Status: She is alert.     BP 118/68   Pulse 74   Temp (!) 97.2 F (36.2 C)   Ht $R'5\' 6"'mX$  (1.676 m)   Wt 144 lb (65.3 kg)   SpO2 99%   BMI 23.24 kg/m  Wt Readings from Last 3 Encounters:  08/17/20 144 lb (65.3 kg)  08/12/20 143 lb 12.8 oz (65.2 kg)  08/09/20 150 lb (68 kg)    Health Maintenance Due  Topic Date Due  . Hepatitis C Screening  Never done  . MAMMOGRAM  10/30/2019    There are no preventive care reminders to display for this patient.   Lab Results  Component Value Date   TSH 1.120 03/02/2020   Lab Results  Component Value Date   WBC 14.2 (H) 08/09/2020   HGB 14.1 08/09/2020   HCT 42.3 08/09/2020   MCV 90 08/09/2020   PLT 193 08/09/2020   Lab Results  Component Value Date   NA 140 06/28/2020   K 3.9 06/28/2020   CO2 27 06/28/2020   GLUCOSE 102 (H) 06/28/2020   BUN 17 06/28/2020   CREATININE 1.33 (H) 06/28/2020   BILITOT 0.4 06/28/2020   ALKPHOS 101 06/28/2020   AST 34 06/28/2020   ALT 35 (H) 06/28/2020   PROT 6.7 06/28/2020   ALBUMIN 4.4 06/28/2020   CALCIUM 9.6 06/28/2020   ANIONGAP 12 03/06/2016   Lab Results  Component Value Date   CHOL 151 06/28/2020   Lab Results  Component Value Date   HDL 53 06/28/2020   Lab Results  Component Value Date   LDLCALC 64 06/28/2020   Lab Results  Component Value Date   TRIG 210 (H) 06/28/2020   Lab Results  Component Value Date   CHOLHDL 2.8 06/28/2020   Lab Results  Component Value Date   HGBA1C 5.9 (H) 06/28/2020       Assessment & Plan:  1. Chronic idiopathic gout involving toe of  right foot without tophus Plan is to start the patient on colchicine once daily and allopurinol 100 mg half pill daily due to her chronic kidney disease.  Hope to remove the colchicine in 1 to 2 months. - allopurinol (ZYLOPRIM) 100 MG tablet; Take 0.5 tablets (50 mg total) by mouth daily.  Dispense: 30 tablet; Refill: 3 - Colchicine (MITIGARE) 0.6 MG CAPS; Take 1 capsule by mouth daily.  Dispense: 30 capsule; Refill: 1  2. Need for immunization against influenza - Flu Vaccine QUAD High Dose(Fluad)  3. Idiopathic chronic gout of right hand with tophus - allopurinol (ZYLOPRIM) 100 MG tablet; Take 0.5 tablets (50 mg total) by mouth daily.  Dispense: 30 tablet; Refill: 3 - Colchicine (MITIGARE) 0.6 MG CAPS; Take 1 capsule by mouth daily.  Dispense: 30 capsule; Refill: 1       Meds ordered this encounter  Medications  . DISCONTD: colchicine 0.6 MG tablet    Sig: Take 1 tablet (0.6 mg total) by mouth daily.    Dispense:  30 tablet    Refill:  1  . allopurinol (ZYLOPRIM) 100 MG tablet    Sig: Take 0.5 tablets (50 mg total) by mouth daily.    Dispense:  30 tablet    Refill:  3  . Colchicine (MITIGARE) 0.6 MG CAPS    Sig: Take 1 capsule by  mouth daily.    Dispense:  30 capsule    Refill:  1    Orders Placed This Encounter  Procedures  . Flu Vaccine QUAD High Dose(Fluad)     Follow-up: No follow-ups on file.  An After Visit Summary was printed and given to the patient.  Rochel Brome [facility] [phone]

## 2020-08-22 ENCOUNTER — Other Ambulatory Visit: Payer: Self-pay | Admitting: Family Medicine

## 2020-09-19 ENCOUNTER — Other Ambulatory Visit: Payer: Self-pay | Admitting: Family Medicine

## 2020-09-23 ENCOUNTER — Other Ambulatory Visit: Payer: Self-pay | Admitting: Family Medicine

## 2020-09-27 ENCOUNTER — Telehealth: Payer: Self-pay

## 2020-09-27 NOTE — Telephone Encounter (Signed)
Pt states she will schedule her awv once she comes in this week.

## 2020-09-29 ENCOUNTER — Encounter: Payer: Self-pay | Admitting: Family Medicine

## 2020-09-29 ENCOUNTER — Ambulatory Visit (INDEPENDENT_AMBULATORY_CARE_PROVIDER_SITE_OTHER): Payer: Medicare Other | Admitting: Family Medicine

## 2020-09-29 ENCOUNTER — Other Ambulatory Visit: Payer: Self-pay

## 2020-09-29 VITALS — BP 124/80 | HR 73 | Temp 97.6°F | Resp 18 | Ht 66.0 in | Wt 148.6 lb

## 2020-09-29 DIAGNOSIS — N1832 Chronic kidney disease, stage 3b: Secondary | ICD-10-CM | POA: Insufficient documentation

## 2020-09-29 DIAGNOSIS — M545 Low back pain, unspecified: Secondary | ICD-10-CM | POA: Diagnosis not present

## 2020-09-29 DIAGNOSIS — M7062 Trochanteric bursitis, left hip: Secondary | ICD-10-CM

## 2020-09-29 DIAGNOSIS — M1A0710 Idiopathic chronic gout, right ankle and foot, without tophus (tophi): Secondary | ICD-10-CM | POA: Diagnosis not present

## 2020-09-29 DIAGNOSIS — R7301 Impaired fasting glucose: Secondary | ICD-10-CM

## 2020-09-29 DIAGNOSIS — I10 Essential (primary) hypertension: Secondary | ICD-10-CM | POA: Diagnosis not present

## 2020-09-29 DIAGNOSIS — E782 Mixed hyperlipidemia: Secondary | ICD-10-CM | POA: Diagnosis not present

## 2020-09-29 DIAGNOSIS — I129 Hypertensive chronic kidney disease with stage 1 through stage 4 chronic kidney disease, or unspecified chronic kidney disease: Secondary | ICD-10-CM | POA: Diagnosis not present

## 2020-09-29 HISTORY — DX: Trochanteric bursitis, left hip: M70.62

## 2020-09-29 HISTORY — DX: Idiopathic chronic gout, right ankle and foot, without tophus (tophi): M1A.0710

## 2020-09-29 MED ORDER — TRIAMCINOLONE ACETONIDE 40 MG/ML IJ SUSP: 40.0000 mg | Freq: Once | INTRAMUSCULAR | Status: DC

## 2020-09-29 NOTE — Progress Notes (Signed)
Established Patient Office Visit  Subjective:  Patient ID: Pamela Schwartz, female    DOB: 04-12-1941  Age: 79 y.o. MRN: [id number]  CC:  Chief Complaint  Patient presents with  . Hypertension  . Gout    HPI planes [name] presents for follow up gout. Taking Colchicine 06 mg once daily and the allopurinol 100 mg 0.5 mg once daily. No gout flares on allopurinol. Pt states gout has been doing much better.   Insomnia: takes ambien cr 12.5 mg once at night.  Hypertensive CKD: metoprolol, amlodipine, and chlorthalidone.   GERD - on pantoprazole. History of Barrett's esophagus.  Prediabetes: eating healthy and exercising.   Chronic back pain: on tramadol. Unable to take nsaids due to CKD stage 3b. Pt is having left hip pain.   Past Medical History:  Diagnosis Date  . Age-related osteoporosis without current pathological fracture   . Arthritis   . Barrett's esophagus without dysplasia   . Cancer [facility])    denies  . Chronic kidney disease   . GERD (gastroesophageal reflux disease)   . Hearing loss   . History of COVID-19   . Hypercholesterolemia   . Hypertension   . Hypertensive chronic kidney disease with stage 1 through stage 4 chronic kidney disease, or unspecified chronic kidney disease   . IBS (irritable bowel syndrome)   . Impaired fasting glucose   . Low back pain   . Mixed hyperlipidemia   . Paresthesia of skin   . Primary insomnia   . Scars   . Vitamin D deficiency, unspecified     Past Surgical History:  Procedure Laterality Date  . ANTERIOR CERVICAL DECOMP/DISCECTOMY FUSION N/A 03/06/2016   Procedure: ACDF - C5-C6 - C6-C7  ;  Surgeon: Earnie Larsson, MD;  Location: [place] [facility];  Service: Neurosurgery;  Laterality: N/A;  ACDF - C5-C6 - C6-C7    . APPENDECTOMY    . BUNIONECTOMY     right and left  . CARDIAC CATHETERIZATION     had in [place] around 2012  . CATARACT EXTRACTION W/ INTRAOCULAR LENS  IMPLANT, BILATERAL    . CESAREAN SECTION    .  COLONOSCOPY W/ BIOPSIES AND POLYPECTOMY    . MULTIPLE TOOTH EXTRACTIONS    . TUBAL LIGATION      Family History  Problem Relation Age of Onset  . Heart attack Mother   . Heart attack Father   . Hypertension Sister   . Hypertension Brother   . Renal Disease Brother   . Heart attack Brother   . Multiple myeloma Brother   . Hypertension Sister   . Hypertension Sister   . Heart attack Sister     Social History   Socioeconomic History  . Marital status: Married    Spouse name: Not on file  . Number of children: 3  . Years of education: Not on file  . Highest education level: Not on file  Occupational History  . Occupation: Retired Therapist, sports  Tobacco Use  . Smoking status: Former Smoker    Packs/day: 0.50    Types: Cigarettes    Quit date: 10/02/2015    Years since quitting: 4.9  . Smokeless tobacco: Never Used  Vaping Use  . Vaping Use: Never used  Substance and Sexual Activity  . Alcohol use: No  . Drug use: No  . Sexual activity: Not on file  Other Topics Concern  . Not on file  Social History Narrative  . Not on file   Social  Determinants of Health   Financial Resource Strain:   . Difficulty of Paying Living Expenses: Not on file  Food Insecurity:   . Worried About Charity fundraiser in the Last Year: Not on file  . Ran Out of Food in the Last Year: Not on file  Transportation Needs:   . Lack of Transportation (Medical): Not on file  . Lack of Transportation (Non-Medical): Not on file  Physical Activity:   . Days of Exercise per Week: Not on file  . Minutes of Exercise per Session: Not on file  Stress:   . Feeling of Stress : Not on file  Social Connections:   . Frequency of Communication with Friends and Family: Not on file  . Frequency of Social Gatherings with Friends and Family: Not on file  . Attends Religious Services: Not on file  . Active Member of Clubs or Organizations: Not on file  . Attends Archivist Meetings: Not on file  . Marital  Status: Not on file  Intimate Partner Violence:   . Fear of Current or Ex-Partner: Not on file  . Emotionally Abused: Not on file  . Physically Abused: Not on file  . Sexually Abused: Not on file    Outpatient Medications Prior to Visit  Medication Sig Dispense Refill  . allopurinol (ZYLOPRIM) 100 MG tablet Take 0.5 tablets (50 mg total) by mouth daily. 30 tablet 3  . amLODipine (NORVASC) 10 MG tablet Take 1 tablet (10 mg total) by mouth daily. 90 tablet 3  . aspirin 81 MG tablet Take 81 mg by mouth daily.    . chlorthalidone (HYGROTON) 25 MG tablet TAKE ONE TABLET BY MOUTH EVERY DAY 90 tablet 3  . Colchicine (MITIGARE) 0.6 MG CAPS Take 1 capsule by mouth daily. 30 capsule 1  . colchicine 0.6 MG tablet Take 1 tablet (0.6 mg total) by mouth daily. 30 tablet 1  . metoprolol succinate (TOPROL-XL) 100 MG 24 hr tablet TAKE 3 TABLETS BY MOUTH EVERY DAY 270 tablet 1  . pantoprazole (PROTONIX) 40 MG tablet Take 1 tablet(s) by mouth daily 90 tablet 3  . promethazine (PHENERGAN) 25 MG tablet take 1 tablet (25 mg) by oral route 4 times per day prn nausea/vomiting 60 tablet 3  . rosuvastatin (CRESTOR) 40 MG tablet TAKE ONE TABLET BY MOUTH EVERY EVENING 90 tablet 0  . traMADol (ULTRAM) 50 MG tablet TAKE ONE TABLET BY MOUTH EVERY 8 HOURS AS NEEDED FOR BACK PAIN 90 tablet 2  . Vitamin D, Ergocalciferol, (DRISDOL) 1.25 MG (50000 UNIT) CAPS capsule TAKE ONE CAPSULE BY MOUTH ONCE WEEKLY 12 capsule 3  . zolpidem (AMBIEN CR) 12.5 MG CR tablet Take 1 tablet (12.5 mg total) by mouth at bedtime. 30 tablet 5   No facility-administered medications prior to visit.    Allergies  Allergen Reactions  . Clonidine Derivatives Other (See Comments)  . Dexilant [Dexlansoprazole] Diarrhea  . Hydralazine   . Nickel   . Tape Other (See Comments)    adhesive adhesive  . Tizanidine Hcl Other (See Comments)    Somnolence  . Zantac [Ranitidine Hcl]   . Chantix [Varenicline] Rash    ROS Review of Systems    Constitutional: Negative for chills and fever.  HENT: Negative for congestion, ear pain, rhinorrhea and sore throat.   Respiratory: Negative for cough and shortness of breath.   Cardiovascular: Negative for chest pain and palpitations.  Gastrointestinal: Positive for diarrhea, nausea and vomiting. Negative for abdominal pain and constipation.  Genitourinary: Negative for dysuria and urgency.  Musculoskeletal: Positive for arthralgias (left hip pain. concerned has bursitis) and back pain. Negative for myalgias.  Neurological: Negative for dizziness and headaches.  Psychiatric/Behavioral: Negative for dysphoric mood. The patient is not nervous/anxious.       Objective:    Physical Exam Constitutional:      Appearance: Normal appearance.  HENT:     Right Ear: Tympanic membrane normal.     Left Ear: Tympanic membrane normal.     Nose: Nose normal.  Neck:     Vascular: No carotid bruit.  Cardiovascular:     Rate and Rhythm: Normal rate and regular rhythm.     Pulses: Normal pulses.     Heart sounds: Normal heart sounds.  Pulmonary:     Effort: Pulmonary effort is normal.     Breath sounds: Normal breath sounds.  Abdominal:     General: Bowel sounds are normal.  Musculoskeletal:        General: Tenderness (left trochanteric bursa. tender left lumbar. FROM of left hip and right hip without inducing pain.) present.  Neurological:     Mental Status: She is alert and oriented to person, place, and time.  Psychiatric:        Mood and Affect: Mood normal.        Behavior: Behavior normal.    BP 124/80   Pulse 73   Temp 97.6 F (36.4 C)   Resp 18   Ht _0  (1.676 m)   Wt 148 lb 9.6 oz (67.4 kg)   SpO2 97%   BMI 23.98 kg/m  Wt Readings from Last 3 Encounters:  09/29/20 148 lb 9.6 oz (67.4 kg)  08/17/20 144 lb (65.3 kg)  08/12/20 143 lb 12.8 oz (65.2 kg)     Health Maintenance Due  Topic Date Due  . Hepatitis C Screening  Never done  . MAMMOGRAM  10/30/2019     There are no preventive care reminders to display for this patient.  Lab Results  Component Value Date   TSH 1.120 03/02/2020   Lab Results  Component Value Date   WBC 14.2 (H) 08/09/2020   HGB 14.1 08/09/2020   HCT 42.3 08/09/2020   MCV 90 08/09/2020   PLT 193 08/09/2020   Lab Results  Component Value Date   NA 140 06/28/2020   K 3.9 06/28/2020   CO2 27 06/28/2020   GLUCOSE 102 (H) 06/28/2020   BUN 17 06/28/2020   CREATININE 1.33 (H) 06/28/2020   BILITOT 0.4 06/28/2020   ALKPHOS 101 06/28/2020   AST 34 06/28/2020   ALT 35 (H) 06/28/2020   PROT 6.7 06/28/2020   ALBUMIN 4.4 06/28/2020   CALCIUM 9.6 06/28/2020   ANIONGAP 12 03/06/2016   Lab Results  Component Value Date   CHOL 151 06/28/2020   Lab Results  Component Value Date   HDL 53 06/28/2020   Lab Results  Component Value Date   LDLCALC 64 06/28/2020   Lab Results  Component Value Date   TRIG 210 (H) 06/28/2020   Lab Results  Component Value Date   CHOLHDL 2.8 06/28/2020   Lab Results  Component Value Date   HGBA1C 5.9 (H) 06/28/2020      Assessment & Plan:  1. Chronic idiopathic gout involving toe of right foot without tophus Stop colchicine. If gout flares increase to twice a day for one week and call us.  Increase allopurinol to 100 mg once daily.  - Uric acid  2.  Hypertensive kidney disease with stage 3b chronic kidney disease ([place]) Well controlled.  No changes to medicines.  Continue to work on eating a healthy diet and exercise.  Labs drawn today.  - CBC with Differential/Platelet - Comprehensive metabolic panel  3. Mixed hyperlipidemia Fairly well controlled.  No changes to medicines.  Continue to work on eating a healthy diet and exercise.  Labs drawn today.  - Lipid panel  4. Impaired fasting glucose Recommend continue to work on eating healthy diet and exercise. - Hemoglobin A1c  5. Lumbar pain The current medical regimen is effective;  continue present plan and  medications.  6. Trochanteric bursitis of left hip If hip pain does not improve, call and I will order xrays of left hip. Procedure: Risks were discussed including bleeding, infection, increase in sugars if diabetic, atrophy at site of injection, and increased pain.  After consent was obtained, using sterile technique the left trochanteric bursa was prepped with betadine and alcohol.  The bursa was entered kenalog 80 mg and 5 ml plain Lidocaine was then injected and the needle withdrawn.  The procedure was well tolerated.   The patient is asked to continue to rest the joint for a few more days before resuming regular activities.  It may be more painful for the first 1-2 days.  Watch for fever, or increased swelling or persistent pain in the joint. Call or return to clinic prn if such symptoms occur or there is failure to improve as anticipated. - triamcinolone acetonide (KENALOG-40) injection 40 mg  7. Stage 3b chronic kidney disease ([place]) Continue to monitor renal function every 3 months.  No nsaids.   Meds ordered this encounter  Medications  . triamcinolone acetonide (KENALOG-40) injection 40 mg   I spent 30 minutes dedicated to the care of this patient on the date of this encounter to include face-to-face time with the patient, as well as reviewing her last lab work, performing medical examination and evaluation, documenting in EMR, performing arthrocentesis, and discussing treatment particularly of her gout and left hip pain.   Follow-up: Return in about 3 months (around 12/30/2020) for fasting.    Rochel Brome, MD

## 2020-09-29 NOTE — Patient Instructions (Signed)
Stop colchicine. If gout flares increase to twice a day for one week and call us.  Increase allopurinol to 100 mg once daily.  If hip pain does not improve, call and I will order xrays of left hip.

## 2020-09-30 LAB — CBC WITH DIFFERENTIAL/PLATELET
Basophils Absolute: 0 10*3/uL (ref 0.0–0.2)
Basos: 0 %
EOS (ABSOLUTE): 0.4 10*3/uL (ref 0.0–0.4)
Eos: 4 %
Hematocrit: 43.8 % (ref 34.0–46.6)
Hemoglobin: 14.4 g/dL (ref 11.1–15.9)
Immature Grans (Abs): 0 10*3/uL (ref 0.0–0.1)
Immature Granulocytes: 0 %
Lymphocytes Absolute: 3.3 10*3/uL — ABNORMAL HIGH (ref 0.7–3.1)
Lymphs: 35 %
MCH: 30 pg (ref 26.6–33.0)
MCHC: 32.9 g/dL (ref 31.5–35.7)
MCV: 91 fL (ref 79–97)
Monocytes Absolute: 0.7 10*3/uL (ref 0.1–0.9)
Monocytes: 7 %
Neutrophils Absolute: 5.1 10*3/uL (ref 1.4–7.0)
Neutrophils: 54 %
Platelets: 187 10*3/uL (ref 150–450)
RBC: 4.8 x10E6/uL (ref 3.77–5.28)
RDW: 14.2 % (ref 11.7–15.4)
WBC: 9.6 10*3/uL (ref 3.4–10.8)

## 2020-09-30 LAB — COMPREHENSIVE METABOLIC PANEL
ALT: 40 IU/L — ABNORMAL HIGH (ref 0–32)
AST: 36 IU/L (ref 0–40)
Albumin/Globulin Ratio: 2.1 (ref 1.2–2.2)
Albumin: 4.6 g/dL (ref 3.7–4.7)
Alkaline Phosphatase: 112 IU/L (ref 44–121)
BUN/Creatinine Ratio: 14 (ref 12–28)
BUN: 20 mg/dL (ref 8–27)
Bilirubin Total: 0.5 mg/dL (ref 0.0–1.2)
CO2: 25 mmol/L (ref 20–29)
Calcium: 9.6 mg/dL (ref 8.7–10.3)
Chloride: 101 mmol/L (ref 96–106)
Creatinine, Ser: 1.38 mg/dL — ABNORMAL HIGH (ref 0.57–1.00)
GFR calc Af Amer: 43 mL/min/{1.73_m2} — ABNORMAL LOW (ref >59)
GFR calc non Af Amer: 37 mL/min/{1.73_m2} — ABNORMAL LOW (ref >59)
Globulin, Total: 2.2 g/dL (ref 1.5–4.5)
Glucose: 116 mg/dL — ABNORMAL HIGH (ref 65–99)
Potassium: 4.3 mmol/L (ref 3.5–5.2)
Sodium: 142 mmol/L (ref 134–144)
Total Protein: 6.8 g/dL (ref 6.0–8.5)

## 2020-09-30 LAB — LIPID PANEL
Chol/HDL Ratio: 2.4 ratio (ref 0.0–4.4)
Cholesterol, Total: 144 mg/dL (ref 100–199)
HDL: 59 mg/dL (ref >39)
LDL Chol Calc (NIH): 56 mg/dL (ref 0–99)
Triglycerides: 174 mg/dL — ABNORMAL HIGH (ref 0–149)
VLDL Cholesterol Cal: 29 mg/dL (ref 5–40)

## 2020-09-30 LAB — HEMOGLOBIN A1C
Est. average glucose Bld gHb Est-mCnc: 126 mg/dL
Hgb A1c MFr Bld: 6 % — ABNORMAL HIGH (ref 4.8–5.6)

## 2020-09-30 LAB — URIC ACID: Uric Acid: 7.8 mg/dL (ref 3.1–7.9)

## 2020-09-30 LAB — CARDIOVASCULAR RISK ASSESSMENT

## 2020-10-03 ENCOUNTER — Other Ambulatory Visit: Payer: Self-pay

## 2020-10-03 DIAGNOSIS — M1A0710 Idiopathic chronic gout, right ankle and foot, without tophus (tophi): Secondary | ICD-10-CM

## 2020-10-03 MED ORDER — TRAMADOL HCL 50 MG PO TABS: ORAL_TABLET | ORAL | 2 refills | Status: DC

## 2020-10-03 MED ORDER — ALLOPURINOL 100 MG PO TABS: 100.0000 mg | ORAL_TABLET | Freq: Every day | ORAL | 0 refills | Status: DC

## 2020-10-13 ENCOUNTER — Telehealth: Payer: Self-pay

## 2020-10-13 NOTE — Telephone Encounter (Signed)
error 

## 2020-10-19 ENCOUNTER — Other Ambulatory Visit: Payer: Self-pay | Admitting: Family Medicine

## 2020-11-18 ENCOUNTER — Other Ambulatory Visit: Payer: Self-pay | Admitting: Physician Assistant

## 2020-11-18 ENCOUNTER — Other Ambulatory Visit: Payer: Self-pay | Admitting: Family Medicine

## 2021-01-02 NOTE — Progress Notes (Signed)
Subjective:  Patient ID: Pamela Schwartz, female    DOB: 12/25/1940  Age: 80 y.o. MRN: [id number]  Chief Complaint  Patient presents with  . Hyperlipidemia  . Hypertension    HPI  Hip Pain - left hip, sharp and burning pain, exacerbated with walking, pain radiates down her leg into her knee. Has been walking with a limp since January. No injury to hip.  Hypertensive kidney disease with stage 3b chronic kidney disease Patient takes amlodipine 10 mg daily, aspirin 81 mg daily, metoprolol 13m 3 tablets every day, chlorthalidone 25 mg daily.  Mixed hyperlipidemia Patient is taking rosuvastatin 40 mg daily. On a low fat diet.  Impaired fasting glucose Patient is eating healthy and low carbs diet.  Essential hypertension, benign Patient takes amlodipine 10 mg daily, aspirin 81 mg daily, metoprolol 1027m3 tablets every day, chlorthalidone 25 mg daily.  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 Patient is not having a lot of hand pain.  Current Outpatient Medications on File Prior to Visit  Medication Sig Dispense Refill  . allopurinol (ZYLOPRIM) 100 MG tablet Take 1 tablet (100 mg total) by mouth daily. 90 tablet 0  . amLODipine (NORVASC) 10 MG tablet Take 1 tablet (10 mg total) by mouth daily. 90 tablet 3  . aspirin 81 MG tablet Take 81 mg by mouth daily.    . chlorthalidone (HYGROTON) 25 MG tablet TAKE ONE TABLET BY MOUTH EVERY DAY 90 tablet 3  . Colchicine (MITIGARE) 0.6 MG CAPS Take 1 capsule by mouth daily. 30 capsule 1  . colchicine 0.6 MG tablet TAKE ONE TABLET BY MOUTH EVERY EVENING 30 tablet 1  . metoprolol succinate (TOPROL-XL) 100 MG 24 hr tablet TAKE 3 TABLETS BY MOUTH EVERY DAY 270 tablet 1  . pantoprazole (PROTONIX) 40 MG tablet Take 1 tablet(s) by mouth daily 90 tablet 3  . promethazine (PHENERGAN) 25 MG tablet take 1 tablet (25 mg) by oral route 4 times per day AS NEEDED nausea/vomiting] 60 tablet 3  . rosuvastatin  (CRESTOR) 40 MG tablet TAKE ONE TABLET BY MOUTH EVERY EVENING 90 tablet 0  . traMADol (ULTRAM) 50 MG tablet TAKE ONE TABLET BY MOUTH EVERY 8 HOURS AS NEEDED FOR BACK PAIN 90 tablet 2  . Vitamin D, Ergocalciferol, (DRISDOL) 1.25 MG (50000 UNIT) CAPS capsule TAKE ONE CAPSULE BY MOUTH ONCE WEEKLY 12 capsule 3  . zolpidem (AMBIEN CR) 12.5 MG CR tablet TAKE ONE TABLET BY MOUTH AT BEDTIME 30 tablet 5   Current Facility-Administered Medications on File Prior to Visit  Medication Dose Route Frequency Provider Last Rate Last Admin  . triamcinolone acetonide (KENALOG-40) injection 40 mg  40 mg Intra-articular Once CoxRochel BromeD       Past Medical History:  Diagnosis Date  . Age-related osteoporosis without current pathological fracture   . Arthritis   . Barrett's esophagus without dysplasia   . [facility]  . Chronic kidney disease   . GERD (gastroesophageal reflux disease)   . Hearing loss   . History of COVID-19   . Hypercholesterolemia   . Hypertension   . Hypertensive chronic kidney disease with stage 1 through stage 4 chronic kidney disease, or unspecified chronic kidney disease   . IBS (irritable bowel syndrome)   . Impaired fasting glucose   . Low back pain   . Mixed hyperlipidemia   . Paresthesia of skin   . Primary insomnia   . Scars   .  Vitamin D deficiency, unspecified    Past Surgical History:  Procedure Laterality Date  . ANTERIOR CERVICAL DECOMP/DISCECTOMY FUSION N/A 03/06/2016   Procedure: ACDF - C5-C6 - C6-C7  ;  Surgeon: Earnie Larsson, MD;  Location: [place] [facility];  Service: Neurosurgery;  Laterality: N/A;  ACDF - C5-C6 - C6-C7    . APPENDECTOMY    . BUNIONECTOMY     right and left  . CARDIAC CATHETERIZATION     had in [place] around 2012  . CATARACT EXTRACTION W/ INTRAOCULAR LENS  IMPLANT, BILATERAL    . CESAREAN SECTION    . COLONOSCOPY W/ BIOPSIES AND POLYPECTOMY    . MULTIPLE TOOTH EXTRACTIONS    . TUBAL LIGATION      Family History  Problem  Relation Age of Onset  . Heart attack Mother   . Heart attack Father   . Hypertension Sister   . Hypertension Brother   . Renal Disease Brother   . Heart attack Brother   . Multiple myeloma Brother   . Hypertension Sister   . Hypertension Sister   . Heart attack Sister    Social History   Socioeconomic History  . Marital status: Married    Spouse name: Not on file  . Number of children: 3  . Years of education: Not on file  . Highest education level: Not on file  Occupational History  . Occupation: Retired Therapist, sports  Tobacco Use  . Smoking status: Former Smoker    Packs/day: 0.50    Types: Cigarettes    Quit date: 10/02/2015    Years since quitting: 5.2  . Smokeless tobacco: Never Used  Vaping Use  . Vaping Use: Never used  Substance and Sexual Activity  . Alcohol use: No  . Drug use: No  . Sexual activity: Not on file  Other Topics Concern  . Not on file  Social History Narrative  . Not on file   Social Determinants of Health   Financial Resource Strain: Not on file  Food Insecurity: Not on file  Transportation Needs: Not on file  Physical Activity: Not on file  Stress: Not on file  Social Connections: Not on file    Review of Systems  Constitutional: Negative for chills, fatigue and fever.  HENT: Negative for congestion, ear pain and sore throat.   Respiratory: Negative for cough and shortness of breath.   Cardiovascular: Negative for chest pain and palpitations.  Gastrointestinal: Negative for abdominal pain, constipation, diarrhea, nausea and vomiting.  Endocrine: Negative for polydipsia, polyphagia and polyuria.  Genitourinary: Negative for difficulty urinating and dysuria.  Musculoskeletal: Positive for arthralgias. Negative for back pain and myalgias.  Skin: Negative for rash.  Neurological: Negative for headaches.  Psychiatric/Behavioral: Negative for dysphoric mood. The patient is not nervous/anxious.      Objective:  BP 118/66   Pulse 71   Temp  (!) 95.8 F (35.4 C)   Ht _0  (1.676 m)   Wt 153 lb (69.4 kg)   SpO2 96%   BMI 24.69 kg/m   BP/Weight 01/03/2021 09/29/2020 40/11/270  Systolic BP 536 644 034  Diastolic BP 66 80 68  Wt. (Lbs) 153 148.6 144  BMI 24.69 23.98 23.24    Physical Exam Vitals reviewed.  Constitutional:      Appearance: Normal appearance.  Musculoskeletal:     Left hip: Tenderness present.     Comments: Increased Left hip pain with ext/int rotation.  Neurological:     Mental Status: She is alert.  Lab Results  Component Value Date   WBC 9.3 01/03/2021   HGB 15.1 01/03/2021   HCT 45.2 01/03/2021   PLT 187 01/03/2021   GLUCOSE 123 (H) 01/03/2021   CHOL 139 01/03/2021   TRIG 160 (H) 01/03/2021   HDL 56 01/03/2021   LDLCALC 56 01/03/2021   ALT 46 (H) 01/03/2021   AST 38 01/03/2021   NA 141 01/03/2021   K 4.0 01/03/2021   CL 100 01/03/2021   CREATININE 1.41 (H) 01/03/2021   BUN 24 01/03/2021   CO2 24 01/03/2021   TSH 0.841 01/03/2021   HGBA1C 6.5 (H) 01/03/2021      Assessment & Plan:   1. Left hip pain - DG Hip Unilat W OR W/O Pelvis 2-3 Views Left  2. Trochanteric bursitis, left hip Risks were discussed including bleeding, infection, increase in sugars if diabetic, atrophy at site of injection, and increased pain.  After consent was obtained, using sterile technique the left trochanteric bursa was prepped with alcohol.  The bursa was entered Kenalog 40 mg and 5 ml plain Lidocaine was then injected and the needle withdrawn.  The procedure was well tolerated.   The patient is asked to continue to rest the joint for a few more days before resuming regular activities.  It may be more painful for the first 1-2 days.  Watch for fever, or increased swelling or persistent pain in the joint. Call or return to clinic prn if such symptoms occur or there is failure to improve as anticipated. - triamcinolone acetonide (KENALOG-40) injection 40 mg  3. Hypertensive kidney disease with stage 3b  chronic kidney disease ([place]) The current medical regimen is effective;  continue present plan and medications.  4. Mixed hyperlipidemia Low fat diet. - Lipid panel - TSH  5. Impaired fasting glucose - Hemoglobin A1c  6. Essential hypertension, benign Well controlled. The current medical regimen is effective;  continue present plan and medications. - Comprehensive metabolic panel - CBC with Differential/Platelet  7. Stage 3b chronic kidney disease ([place]) See above.  8. 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 Vitamin D deficiency - VITAMIN D 25 Hydroxy (Vit-D Deficiency, Fractures)    Meds ordered this encounter  Medications  . triamcinolone acetonide (KENALOG-40) injection 40 mg    Orders Placed This Encounter  Procedures  . DG Hip Unilat W OR W/O Pelvis 2-3 Views Left  . Comprehensive metabolic panel  . Hemoglobin A1c  . Lipid panel  . CBC with Differential/Platelet  . TSH  . Uric acid  . VITAMIN D 25 Hydroxy (Vit-D Deficiency, Fractures)  . Cardiovascular Risk Assessment     Follow-up: Return in about 3 months (around 04/02/2021) for fasting.  An After Visit Summary was printed and given to the patient.  Rochel Brome, MD [facility] [phone]

## 2021-01-03 ENCOUNTER — Ambulatory Visit (INDEPENDENT_AMBULATORY_CARE_PROVIDER_SITE_OTHER): Payer: Medicare Other | Admitting: Family Medicine

## 2021-01-03 ENCOUNTER — Other Ambulatory Visit: Payer: Self-pay

## 2021-01-03 ENCOUNTER — Encounter: Payer: Self-pay | Admitting: Family Medicine

## 2021-01-03 VITALS — BP 118/66 | HR 71 | Temp 95.8°F | Ht 66.0 in | Wt 153.0 lb

## 2021-01-03 DIAGNOSIS — N1832 Chronic kidney disease, stage 3b: Secondary | ICD-10-CM

## 2021-01-03 DIAGNOSIS — M25552 Pain in left hip: Secondary | ICD-10-CM

## 2021-01-03 DIAGNOSIS — I10 Essential (primary) hypertension: Secondary | ICD-10-CM

## 2021-01-03 DIAGNOSIS — E559 Vitamin D deficiency, unspecified: Secondary | ICD-10-CM

## 2021-01-03 DIAGNOSIS — M1A0411 Idiopathic chronic gout, right hand, with tophus (tophi): Secondary | ICD-10-CM | POA: Diagnosis not present

## 2021-01-03 DIAGNOSIS — R7301 Impaired fasting glucose: Secondary | ICD-10-CM

## 2021-01-03 DIAGNOSIS — I129 Hypertensive chronic kidney disease with stage 1 through stage 4 chronic kidney disease, or unspecified chronic kidney disease: Secondary | ICD-10-CM

## 2021-01-03 DIAGNOSIS — E782 Mixed hyperlipidemia: Secondary | ICD-10-CM | POA: Diagnosis not present

## 2021-01-03 DIAGNOSIS — M7062 Trochanteric bursitis, left hip: Secondary | ICD-10-CM | POA: Diagnosis not present

## 2021-01-03 MED ORDER — TRIAMCINOLONE ACETONIDE 40 MG/ML IJ SUSP
40.0000 mg | Freq: Once | INTRAMUSCULAR | Status: AC
  Administered 2021-01-03: 40 mg via INTRA_ARTICULAR

## 2021-01-04 LAB — CARDIOVASCULAR RISK ASSESSMENT

## 2021-01-04 LAB — COMPREHENSIVE METABOLIC PANEL
ALT: 46 IU/L — ABNORMAL HIGH (ref 0–32)
AST: 38 IU/L (ref 0–40)
Albumin/Globulin Ratio: 1.6 (ref 1.2–2.2)
Albumin: 4.2 g/dL (ref 3.7–4.7)
Alkaline Phosphatase: 107 IU/L (ref 44–121)
BUN/Creatinine Ratio: 17 (ref 12–28)
BUN: 24 mg/dL (ref 8–27)
Bilirubin Total: 0.4 mg/dL (ref 0.0–1.2)
CO2: 24 mmol/L (ref 20–29)
Calcium: 9.6 mg/dL (ref 8.7–10.3)
Chloride: 100 mmol/L (ref 96–106)
Creatinine, Ser: 1.41 mg/dL — ABNORMAL HIGH (ref 0.57–1.00)
GFR calc Af Amer: 41 mL/min/{1.73_m2} — ABNORMAL LOW (ref >59)
GFR calc non Af Amer: 36 mL/min/{1.73_m2} — ABNORMAL LOW (ref >59)
Globulin, Total: 2.6 g/dL (ref 1.5–4.5)
Glucose: 123 mg/dL — ABNORMAL HIGH (ref 65–99)
Potassium: 4 mmol/L (ref 3.5–5.2)
Sodium: 141 mmol/L (ref 134–144)
Total Protein: 6.8 g/dL (ref 6.0–8.5)

## 2021-01-04 LAB — CBC WITH DIFFERENTIAL/PLATELET
Basophils Absolute: 0.1 10*3/uL (ref 0.0–0.2)
Basos: 1 %
EOS (ABSOLUTE): 0.4 10*3/uL (ref 0.0–0.4)
Eos: 4 %
Hematocrit: 45.2 % (ref 34.0–46.6)
Hemoglobin: 15.1 g/dL (ref 11.1–15.9)
Immature Grans (Abs): 0 10*3/uL (ref 0.0–0.1)
Immature Granulocytes: 0 %
Lymphocytes Absolute: 3.1 10*3/uL (ref 0.7–3.1)
Lymphs: 33 %
MCH: 30.5 pg (ref 26.6–33.0)
MCHC: 33.4 g/dL (ref 31.5–35.7)
MCV: 91 fL (ref 79–97)
Monocytes Absolute: 0.9 10*3/uL (ref 0.1–0.9)
Monocytes: 9 %
Neutrophils Absolute: 4.9 10*3/uL (ref 1.4–7.0)
Neutrophils: 53 %
Platelets: 187 10*3/uL (ref 150–450)
RBC: 4.95 x10E6/uL (ref 3.77–5.28)
RDW: 12.8 % (ref 11.7–15.4)
WBC: 9.3 10*3/uL (ref 3.4–10.8)

## 2021-01-04 LAB — LIPID PANEL
Chol/HDL Ratio: 2.5 ratio (ref 0.0–4.4)
Cholesterol, Total: 139 mg/dL (ref 100–199)
HDL: 56 mg/dL (ref >39)
LDL Chol Calc (NIH): 56 mg/dL (ref 0–99)
Triglycerides: 160 mg/dL — ABNORMAL HIGH (ref 0–149)
VLDL Cholesterol Cal: 27 mg/dL (ref 5–40)

## 2021-01-04 LAB — HEMOGLOBIN A1C
Est. average glucose Bld gHb Est-mCnc: 140 mg/dL
Hgb A1c MFr Bld: 6.5 % — ABNORMAL HIGH (ref 4.8–5.6)

## 2021-01-04 LAB — VITAMIN D 25 HYDROXY (VIT D DEFICIENCY, FRACTURES): Vit D, 25-Hydroxy: 82.7 ng/mL (ref 30.0–100.0)

## 2021-01-04 LAB — TSH: TSH: 0.841 u[IU]/mL (ref 0.450–4.500)

## 2021-01-04 LAB — URIC ACID: Uric Acid: 7.2 mg/dL (ref 3.1–7.9)

## 2021-01-05 NOTE — Progress Notes (Signed)
Patient informed.  She reports the injection has helped a great deal with the pain.  She will watch her diet more closely and follow-up as instructed.

## 2021-01-17 ENCOUNTER — Other Ambulatory Visit: Payer: Self-pay | Admitting: Family Medicine

## 2021-01-17 DIAGNOSIS — M1A0710 Idiopathic chronic gout, right ankle and foot, without tophus (tophi): Secondary | ICD-10-CM

## 2021-01-18 ENCOUNTER — Other Ambulatory Visit: Payer: Self-pay | Admitting: Family Medicine

## 2021-01-18 ENCOUNTER — Other Ambulatory Visit: Payer: Self-pay | Admitting: Physician Assistant

## 2021-01-18 DIAGNOSIS — M1A0710 Idiopathic chronic gout, right ankle and foot, without tophus (tophi): Secondary | ICD-10-CM

## 2021-01-18 MED ORDER — COLCHICINE 0.6 MG PO CAPS: 1.0000 | ORAL_CAPSULE | Freq: Two times a day (BID) | ORAL | 1 refills | Status: DC

## 2021-01-25 ENCOUNTER — Ambulatory Visit: Payer: Medicare Other

## 2021-01-25 DIAGNOSIS — H353131 Nonexudative age-related macular degeneration, bilateral, early dry stage: Secondary | ICD-10-CM | POA: Diagnosis not present

## 2021-01-25 LAB — HM DIABETES EYE EXAM

## 2021-02-01 ENCOUNTER — Ambulatory Visit (INDEPENDENT_AMBULATORY_CARE_PROVIDER_SITE_OTHER): Payer: Medicare Other

## 2021-02-01 VITALS — BP 144/80 | HR 70 | Temp 97.9°F | Resp 16 | Ht 66.0 in | Wt 154.6 lb

## 2021-02-01 DIAGNOSIS — Z0000 Encounter for general adult medical examination without abnormal findings: Secondary | ICD-10-CM

## 2021-02-01 NOTE — Patient Instructions (Signed)
 Fall Prevention in the Home, Adult Falls can cause injuries and can happen to people of all ages. There are many things you can do to make your home safe and to help prevent falls. Ask for help when making these changes. What actions can I take to prevent falls? General Instructions  Use good lighting in all rooms. Replace any light bulbs that burn out.  Turn on the lights in dark areas. Use night-lights.  Keep items that you use often in easy-to-reach places. Lower the shelves around your home if needed.  Set up your furniture so you have a clear path. Avoid moving your furniture around.  Do not have throw rugs or other things on the floor that can make you trip.  Avoid walking on wet floors.  If any of your floors are uneven, fix them.  Add color or contrast paint or tape to clearly mark and help you see: ? Grab bars or handrails. ? First and last steps of staircases. ? Where the edge of each step is.  If you use a stepladder: ? Make sure that it is fully opened. Do not climb a closed stepladder. ? Make sure the sides of the stepladder are locked in place. ? Ask someone to hold the stepladder while you use it.  Know where your pets are when moving through your home. What can I do in the bathroom?  Keep the floor dry. Clean up any water on the floor right away.  Remove soap buildup in the tub or shower.  Use nonskid mats or decals on the floor of the tub or shower.  Attach bath mats securely with double-sided, nonslip rug tape.  If you need to sit down in the shower, use a plastic, nonslip stool.  Install grab bars by the toilet and in the tub and shower. Do not use towel bars as grab bars.      What can I do in the bedroom?  Make sure that you have a light by your bed that is easy to reach.  Do not use any sheets or blankets for your bed that hang to the floor.  Have a firm chair with side arms that you can use for support when you get dressed. What can I do  in the kitchen?  Clean up any spills right away.  If you need to reach something above you, use a step stool with a grab bar.  Keep electrical cords out of the way.  Do not use floor polish or wax that makes floors slippery. What can I do with my stairs?  Do not leave any items on the stairs.  Make sure that you have a light switch at the top and the bottom of the stairs.  Make sure that there are handrails on both sides of the stairs. Fix handrails that are broken or loose.  Install nonslip stair treads on all your stairs.  Avoid having throw rugs at the top or bottom of the stairs.  Choose a carpet that does not hide the edge of the steps on the stairs.  Check carpeting to make sure that it is firmly attached to the stairs. Fix carpet that is loose or worn. What can I do on the outside of my home?  Use bright outdoor lighting.  Fix the edges of walkways and driveways and fix any cracks.  Remove anything that might make you trip as you walk through a door, such as a raised step or threshold.  Trim   any bushes or trees on paths to your home.  Check to see if handrails are loose or broken and that both sides of all steps have handrails.  Install guardrails along the edges of any raised decks and porches.  Clear paths of anything that can make you trip, such as tools or rocks.  Have leaves, snow, or ice cleared regularly.  Use sand or salt on paths during winter.  Clean up any spills in your garage right away. This includes grease or oil spills. What other actions can I take?  Wear shoes that: ? Have a low heel. Do not wear high heels. ? Have rubber bottoms. ? Feel good on your feet and fit well. ? Are closed at the toe. Do not wear open-toe sandals.  Use tools that help you move around if needed. These include: ? Canes. ? Walkers. ? Scooters. ? Crutches.  Review your medicines with your doctor. Some medicines can make you feel dizzy. This can increase your  chance of falling. Ask your doctor what else you can do to help prevent falls. Where to find more information  Centers for Disease Control and Prevention, [employer]: www.cdc.gov  National Institute on Aging: www.nia.nih.gov Contact a doctor if:  You are afraid of falling at home.  You feel weak, drowsy, or dizzy at home.  You fall at home. Summary  There are many simple things that you can do to make your home safe and to help prevent falls.  Ways to make your home safe include removing things that can make you trip and installing grab bars in the bathroom.  Ask for help when making these changes in your home. This information is not intended to replace advice given to you by your health care provider. Make sure you discuss any questions you have with your health care provider. Document Revised: 06/01/2020 Document Reviewed: 06/01/2020 Elsevier Patient Education  2021 Elsevier Inc.   Health Maintenance, Female Adopting a healthy lifestyle and getting preventive care are important in promoting health and wellness. Ask your health care provider about:  The right schedule for you to have regular tests and exams.  Things you can do on your own to prevent diseases and keep yourself healthy. What should I know about diet, weight, and exercise? Eat a healthy diet  Eat a diet that includes plenty of vegetables, fruits, low-fat dairy products, and lean protein.  Do not eat a lot of foods that are high in solid fats, added sugars, or sodium.   Maintain a healthy weight Body mass index (BMI) is used to identify weight problems. It estimates body fat based on height and weight. Your health care provider can help determine your BMI and help you achieve or maintain a healthy weight. Get regular exercise Get regular exercise. This is one of the most important things you can do for your health. Most adults should:  Exercise for at least 150 minutes each week. The exercise should increase  your heart rate and make you sweat (moderate-intensity exercise).  Do strengthening exercises at least twice a week. This is in addition to the moderate-intensity exercise.  Spend less time sitting. Even light physical activity can be beneficial. Watch cholesterol and blood lipids Have your blood tested for lipids and cholesterol at 78 years of age, then have this test every 5 years. Have your cholesterol levels checked more often if:  Your lipid or cholesterol levels are high.  You are older than 78 years of age.  You are at   high risk for heart disease. What should I know about cancer screening? Depending on your health history and family history, you may need to have cancer screening at various ages. This may include screening for:  Breast cancer.  Cervical cancer.  Colorectal cancer.  Skin cancer.  Lung cancer. What should I know about heart disease, diabetes, and high blood pressure? Blood pressure and heart disease  High blood pressure causes heart disease and increases the risk of stroke. This is more likely to develop in people who have high blood pressure readings, are of African descent, or are overweight.  Have your blood pressure checked: ? Every 3-5 years if you are 18-39 years of age. ? Every year if you are 40 years old or older. Diabetes Have regular diabetes screenings. This checks your fasting blood sugar level. Have the screening done:  Once every three years after age 40 if you are at a normal weight and have a low risk for diabetes.  More often and at a younger age if you are overweight or have a high risk for diabetes. What should I know about preventing infection? Hepatitis B If you have a higher risk for hepatitis B, you should be screened for this virus. Talk with your health care provider to find out if you are at risk for hepatitis B infection. Hepatitis C Testing is recommended for:  Everyone born from 1945 through 1965.  Anyone with known  risk factors for hepatitis C. Sexually transmitted infections (STIs)  Get screened for STIs, including gonorrhea and chlamydia, if: ? You are sexually active and are younger than 78 years of age. ? You are older than 78 years of age and your health care provider tells you that you are at risk for this type of infection. ? Your sexual activity has changed since you were last screened, and you are at increased risk for chlamydia or gonorrhea. Ask your health care provider if you are at risk.  Ask your health care provider about whether you are at high risk for HIV. Your health care provider may recommend a prescription medicine to help prevent HIV infection. If you choose to take medicine to prevent HIV, you should first get tested for HIV. You should then be tested every 3 months for as long as you are taking the medicine. Pregnancy  If you are about to stop having your period (premenopausal) and you may become pregnant, seek counseling before you get pregnant.  Take 400 to 800 micrograms (mcg) of folic acid every day if you become pregnant.  Ask for birth control (contraception) if you want to prevent pregnancy. Osteoporosis and menopause Osteoporosis is a disease in which the bones lose minerals and strength with aging. This can result in bone fractures. If you are 65 years old or older, or if you are at risk for osteoporosis and fractures, ask your health care provider if you should:  Be screened for bone loss.  Take a calcium or vitamin D supplement to lower your risk of fractures.  Be given hormone replacement therapy (HRT) to treat symptoms of menopause. Follow these instructions at home: Lifestyle  Do not use any products that contain nicotine or tobacco, such as cigarettes, e-cigarettes, and chewing tobacco. If you need help quitting, ask your health care provider.  Do not use street drugs.  Do not share needles.  Ask your health care provider for help if you need support or  information about quitting drugs. Alcohol use  Do not drink alcohol   if: ? Your health care provider tells you not to drink. ? You are pregnant, may be pregnant, or are planning to become pregnant.  If you drink alcohol: ? Limit how much you use to 0-1 drink a day. ? Limit intake if you are breastfeeding.  Be aware of how much alcohol is in your drink. In the [place] one drink equals one 12 oz bottle of beer (355 mL), one 5 oz glass of wine (148 mL), or one 1 oz glass of hard liquor (44 mL). General instructions  Schedule regular health, dental, and eye exams.  Stay current with your vaccines.  Tell your health care provider if: ? You often feel depressed. ? You have ever been abused or do not feel safe at home. Summary  Adopting a healthy lifestyle and getting preventive care are important in promoting health and wellness.  Follow your health care provider's instructions about healthy diet, exercising, and getting tested or screened for diseases.  Follow your health care provider's instructions on monitoring your cholesterol and blood pressure. This information is not intended to replace advice given to you by your health care provider. Make sure you discuss any questions you have with your health care provider. Document Revised: 10/22/2018 Document Reviewed: 10/22/2018 Elsevier Patient Education  2021 Elsevier Inc.  

## 2021-02-01 NOTE — Progress Notes (Signed)
Subjective:   Ninetta Adelstein is a 80 y.o. female who presents for Medicare Annual (Subsequent) preventive examination.  This wellness visit is conducted by a nurse.  The patient's medications were reviewed and reconciled since the patient's last visit.  History details were provided by the patient.  The history appears to be reliable.    Patient's last AWV was over one year ago.   Medical History: Patient history and Family history was reviewed  Medications, Allergies, and preventative health maintenance was reviewed and updated.   Review of Systems    Review of Systems  Constitutional: Negative.   HENT: Negative.   Eyes: Negative.   Respiratory: Negative.  Negative for cough, chest tightness and shortness of breath.   Cardiovascular: Negative for chest pain and palpitations.  Gastrointestinal: Negative.   Genitourinary: Negative.   Musculoskeletal: Negative.  Negative for arthralgias, back pain and myalgias.  Neurological: Negative.  Negative for dizziness, weakness and headaches.  Psychiatric/Behavioral: Negative.  Negative for agitation, confusion and suicidal ideas.   Cardiac Risk Factors include: advanced age (>58mn, >>6women);diabetes mellitus     Objective:    Today's Vitals   02/01/21 0954  BP: (!) 144/80  Pulse: 70  Resp: 16  Temp: 97.9 F (36.6 C)  SpO2: 93%  Weight: 154 lb 9.6 oz (70.1 kg)  Height: _0  (1.676 m)  PainSc: 0-No pain   Body mass index is 24.95 kg/m.  Advanced Directives 02/01/2021  Does Patient Have a Medical Advance Directive? Yes  Type of AParamedicof AMcIntoshLiving will  Does patient want to make changes to medical advance directive? No - Patient declined  Copy of [place] Chart? No - copy requested    Current Medications (verified) Outpatient Encounter Medications as of 02/01/2021  Medication Sig  . allopurinol (ZYLOPRIM) 100 MG tablet TAKE ONE TABLET BY MOUTH EVERY EVENING  .  amLODipine (NORVASC) 10 MG tablet Take 1 tablet (10 mg total) by mouth daily.  .Marland Kitchenaspirin 81 MG tablet Take 81 mg by mouth daily.  . chlorthalidone (HYGROTON) 25 MG tablet TAKE ONE TABLET BY MOUTH EVERY DAY  . Colchicine (MITIGARE) 0.6 MG CAPS Take 1 capsule by mouth in the morning and at bedtime. X 1 week for gout flare.  . metoprolol succinate (TOPROL-XL) 100 MG 24 hr tablet TAKE 3 TABLETS BY MOUTH EVERY DAY  . pantoprazole (PROTONIX) 40 MG tablet Take 1 tablet(s) by mouth daily  . promethazine (PHENERGAN) 25 MG tablet take 1 tablet (25 mg) by oral route 4 times per day AS NEEDED nausea/vomiting]  . rosuvastatin (CRESTOR) 40 MG tablet TAKE ONE TABLET BY MOUTH EVERY EVENING  . traMADol (ULTRAM) 50 MG tablet TAKE ONE TABLET BY MOUTH EVERY 8 HOURS AS NEEDED FOR BACK PAIN  . Vitamin D, Ergocalciferol, (DRISDOL) 1.25 MG (50000 UNIT) CAPS capsule TAKE ONE CAPSULE BY MOUTH ONCE WEEKLY  . zolpidem (AMBIEN CR) 12.5 MG CR tablet TAKE ONE TABLET BY MOUTH AT BEDTIME   Facility-Administered Encounter Medications as of 02/01/2021  Medication  . triamcinolone acetonide (KENALOG-40) injection 40 mg    Allergies (verified) Clonidine derivatives, Dexilant [dexlansoprazole], Hydralazine, Nickel, Tape, Tizanidine hcl, Zantac [ranitidine hcl], and Chantix [varenicline]   History: Past Medical History:  Diagnosis Date  . Age-related osteoporosis without current pathological fracture   . Arthritis   . Barrett's esophagus without dysplasia   . Cancer ([facility]    denies  . Chronic kidney disease   . GERD (gastroesophageal reflux disease)   .  Hearing loss   . History of COVID-19   . Hypercholesterolemia   . Hypertension   . Hypertensive chronic kidney disease with stage 1 through stage 4 chronic kidney disease, or unspecified chronic kidney disease   . IBS (irritable bowel syndrome)   . Impaired fasting glucose   . Low back pain   . Mixed hyperlipidemia   . Paresthesia of skin   . Primary insomnia   .  Scars   . Vitamin D deficiency, unspecified    Past Surgical History:  Procedure Laterality Date  . ANTERIOR CERVICAL DECOMP/DISCECTOMY FUSION N/A 03/06/2016   Procedure: ACDF - C5-C6 - C6-C7  ;  Surgeon: Earnie Larsson, MD;  Location: [place] [facility];  Service: Neurosurgery;  Laterality: N/A;  ACDF - C5-C6 - C6-C7    . APPENDECTOMY    . BUNIONECTOMY     right and left  . CARDIAC CATHETERIZATION     had in [place] around 2012  . CATARACT EXTRACTION W/ INTRAOCULAR LENS  IMPLANT, BILATERAL    . CESAREAN SECTION    . COLONOSCOPY W/ BIOPSIES AND POLYPECTOMY    . MULTIPLE TOOTH EXTRACTIONS    . TUBAL LIGATION     Family History  Problem Relation Age of Onset  . Heart attack Mother   . Heart attack Father   . Hypertension Sister   . Hypertension Brother   . Renal Disease Brother   . Heart attack Brother   . Multiple myeloma Brother   . Hypertension Sister   . Hypertension Sister   . Heart attack Sister    Social History   Socioeconomic History  . Marital status: Married    Spouse name: Not on file  . Number of children: 3  . Years of education: Not on file  . Highest education level: Not on file  Occupational History  . Occupation: Retired Therapist, sports  Tobacco Use  . Smoking status: Former Smoker    Packs/day: 0.50    Types: Cigarettes    Quit date: 10/02/2015    Years since quitting: 5.3  . Smokeless tobacco: Never Used  Vaping Use  . Vaping Use: Never used  Substance and Sexual Activity  . Alcohol use: No  . Drug use: No  . Sexual activity: Not on file  Other Topics Concern  . Not on file  Social History Narrative  . Not on file   Social Determinants of Health   Financial Resource Strain: Not on file  Food Insecurity: No Food Insecurity  . Worried About Charity fundraiser in the Last Year: Never true  . Ran Out of Food in the Last Year: Never true  Transportation Needs: No Transportation Needs  . Lack of Transportation (Medical): No  . Lack of Transportation  (Non-Medical): No  Physical Activity: Not on file  Stress: No Stress Concern Present  . Feeling of Stress : Not at all  Social Connections: Not on file    Tobacco Counseling Counseling given: Former Smoker   Clinical Intake:  Pre-visit preparation completed: Yes  Pain : No/denies pain Pain Score: 0-No pain     BMI - recorded: 24.95 Nutritional Status: BMI of 19-24  Normal Nutritional Risks: None Diabetes: Yes CBG done?: No Did pt. bring in CBG monitor from home?: No  How often do you need to have someone help you when you read instructions, pamphlets, or other written materials from your doctor or pharmacy?: 2 - Rarely  Interpreter Needed?: No      Activities of  Daily Living In your present state of health, do you have any difficulty performing the following activities: 02/01/2021 05/25/2020  Hearing? Y N  Vision? N N  Difficulty concentrating or making decisions? N N  Walking or climbing stairs? N N  Dressing or bathing? N N  Doing errands, shopping? N N  Preparing Food and eating ? N -  Using the Toilet? N -  In the past six months, have you accidently leaked urine? N -  Do you have problems with loss of bowel control? N -  Managing your Medications? N -  Managing your Finances? N -  Housekeeping or managing your Housekeeping? N -  Some recent data might be hidden    Patient Care Team: Rochel Brome, MD as PCP - General (Family Medicine)     Assessment:   This is a routine wellness examination for Breeze.  Vision screen Vision screening done by [employer] last week  Dietary issues and exercise activities discussed: Current Exercise Habits: Home exercise routine, Type of exercise: walking, Time (Minutes): 30, Frequency (Times/Week): 4, Weekly Exercise (Minutes/Week): 120, Intensity: Mild, Exercise limited by: None identified  Depression Screen PHQ 2/9 Scores 02/01/2021 03/02/2020  PHQ - 2 Score 0 0    Fall Risk Fall Risk  02/01/2021 03/02/2020  06/07/2017  Falls in the past year? 1 0 No  Comment - - Emmi Telephone Survey: data to providers prior to load  Number falls in past yr: 0 0 -  Injury with Fall? 0 0 -  Risk for fall due to : No Fall Risks - -  Follow up Falls evaluation completed;Education provided Falls evaluation completed;Falls prevention discussed -    FALL RISK PREVENTION PERTAINING TO THE HOME:  Any stairs in or around the home? Yes  If so, are there any without handrails? Yes  Home free of loose throw rugs in walkways, pet beds, electrical cords, etc? Yes  Adequate lighting in your home to reduce risk of falls? Yes   ASSISTIVE DEVICES UTILIZED TO PREVENT FALLS:  Life alert? No  Use of a cane, walker or w/c? No  Grab bars in the bathroom? No  Shower chair or bench in shower? Yes  Elevated toilet seat or a handicapped toilet? No   Gait steady and fast without use of assistive device  Cognitive Function:     6CIT Screen 02/01/2021  What Year? 0 points  What month? 0 points  What time? 0 points  Count back from 20 0 points  Months in reverse 0 points  Repeat phrase 0 points  Total Score 0    Immunizations Immunization History  Administered Date(s) Administered  . Fluad Quad(high Dose 65+) 08/17/2020  . Influenza-Unspecified 07/13/2018, 07/24/2019  . Moderna Sars-Covid-2 Vaccination 12/04/2019, 12/28/2019, 09/06/2020  . Pneumococcal Conjugate-13 10/12/2014  . Pneumococcal Polysaccharide-23 09/22/2013  . Tdap 07/14/2015  . Zoster 11/13/2011  . Zoster Recombinat (Shingrix) 01/15/2018, 05/26/2018    TDAP status: Up to date  Flu Vaccine status: Up to date  Pneumococcal vaccine status: Up to date  Covid-19 vaccine status: Completed vaccines  Screening Tests Health Maintenance  Topic Date Due  . MAMMOGRAM  02/01/2022 (Originally 10/30/2019)  . TETANUS/TDAP  07/13/2025  . INFLUENZA VACCINE  Completed  . DEXA SCAN  Completed  . COVID-19 Vaccine  Completed  . PNA vac Low Risk Adult   Completed  . HPV VACCINES  Aged Out    Health Maintenance  Colorectal cancer screening: Type of screening: Colonoscopy. Completed 12/11/17, Dr Lyndel Safe  Mammogram status: Completed 10/29/2018. Repeat every year  Bone Density status: Completed 10/29/2018. Results reflect: Bone density results: OSTEOPENIA. Repeat every 2 years.  Lung Cancer Screening: (Low Dose CT Chest recommended if Age 50-80 years, 30 pack-year currently smoking OR have quit w/in 15years.) does not qualify.    Additional Screening:  Vision Screening: Recommended annual ophthalmology exams for early detection of glaucoma and other disorders of the eye. Is the patient up to date with their annual eye exam?  Yes  Who is the provider or what is the name of the office in which the patient attends annual eye exams? [place] Screening: Recommended annual dental exams for proper oral hygiene    Plan:     1- Yearly mammogram recommended, patient refused future mammograms 2- Requested patient bring in a copy of her advance directives for our file  I have personally reviewed and noted the following in the patient's chart:   . Medical and social history . Use of alcohol, tobacco or illicit drugs  . Current medications and supplements . Functional ability and status . Nutritional status . Physical activity . Advanced directives . List of other physicians . Hospitalizations, surgeries, and ER visits in previous 12 months . Vitals . Screenings to include cognitive, depression, and falls . Referrals and appointments  In addition, I have reviewed and discussed with patient certain preventive protocols, quality metrics, and best practice recommendations. A written personalized care plan for preventive services as well as general preventive health recommendations were provided to patient.     Erie Noe, LPN   [id number]

## 2021-02-16 ENCOUNTER — Other Ambulatory Visit: Payer: Self-pay | Admitting: Family Medicine

## 2021-03-07 ENCOUNTER — Ambulatory Visit (INDEPENDENT_AMBULATORY_CARE_PROVIDER_SITE_OTHER): Payer: Medicare Other

## 2021-03-07 DIAGNOSIS — Z23 Encounter for immunization: Secondary | ICD-10-CM | POA: Diagnosis not present

## 2021-03-07 NOTE — Progress Notes (Signed)
   Covid-19 Vaccination Clinic  Name:  Sariyah Corcino    MRN: [id number] DOB: 1941-01-12  03/07/2021  Ms. Sparrow was observed post Covid-19 immunization for 15 minutes without incident. She was provided with Vaccine Information Sheet and instruction to access the V-Safe system.   Ms. Stampley was instructed to call 911 with any severe reactions post vaccine: Marland Kitchen Difficulty breathing  . Swelling of face and throat  . A fast heartbeat  . A bad rash all over body  . Dizziness and weakness

## 2021-03-09 ENCOUNTER — Telehealth: Payer: Self-pay
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
## 2021-03-24 ENCOUNTER — Other Ambulatory Visit: Payer: Self-pay | Admitting: Physician Assistant

## 2021-03-24 ENCOUNTER — Other Ambulatory Visit: Payer: Self-pay | Admitting: Family Medicine

## 2021-03-24 DIAGNOSIS — M1A0710 Idiopathic chronic gout, right ankle and foot, without tophus (tophi): Secondary | ICD-10-CM

## 2021-03-27 ENCOUNTER — Other Ambulatory Visit: Payer: Self-pay | Admitting: Family Medicine

## 2021-04-03 ENCOUNTER — Other Ambulatory Visit: Payer: Self-pay | Admitting: Family Medicine

## 2021-04-03 ENCOUNTER — Other Ambulatory Visit: Payer: Medicare Other

## 2021-04-03 DIAGNOSIS — E782 Mixed hyperlipidemia: Secondary | ICD-10-CM | POA: Diagnosis not present

## 2021-04-03 DIAGNOSIS — N1832 Chronic kidney disease, stage 3b: Secondary | ICD-10-CM | POA: Diagnosis not present

## 2021-04-03 DIAGNOSIS — I129 Hypertensive chronic kidney disease with stage 1 through stage 4 chronic kidney disease, or unspecified chronic kidney disease: Secondary | ICD-10-CM | POA: Diagnosis not present

## 2021-04-03 DIAGNOSIS — R7301 Impaired fasting glucose: Secondary | ICD-10-CM

## 2021-04-03 DIAGNOSIS — R5383 Other fatigue: Secondary | ICD-10-CM | POA: Diagnosis not present

## 2021-04-03 NOTE — Telephone Encounter (Signed)
Reviewed. Kc  

## 2021-04-04 ENCOUNTER — Encounter: Payer: Self-pay | Admitting: Family Medicine

## 2021-04-04 LAB — CBC WITH DIFFERENTIAL/PLATELET
Basophils Absolute: 0.1 10*3/uL (ref 0.0–0.2)
Basos: 1 %
EOS (ABSOLUTE): 0.5 10*3/uL — ABNORMAL HIGH (ref 0.0–0.4)
Eos: 6 %
Hematocrit: 43.2 % (ref 34.0–46.6)
Hemoglobin: 14.6 g/dL (ref 11.1–15.9)
Immature Grans (Abs): 0 10*3/uL (ref 0.0–0.1)
Immature Granulocytes: 0 %
Lymphocytes Absolute: 3.6 10*3/uL — ABNORMAL HIGH (ref 0.7–3.1)
Lymphs: 39 %
MCH: 31 pg (ref 26.6–33.0)
MCHC: 33.8 g/dL (ref 31.5–35.7)
MCV: 92 fL (ref 79–97)
Monocytes Absolute: 0.9 10*3/uL (ref 0.1–0.9)
Monocytes: 10 %
Neutrophils Absolute: 4.1 10*3/uL (ref 1.4–7.0)
Neutrophils: 44 %
Platelets: 193 10*3/uL (ref 150–450)
RBC: 4.71 x10E6/uL (ref 3.77–5.28)
RDW: 12.7 % (ref 11.7–15.4)
WBC: 9.2 10*3/uL (ref 3.4–10.8)

## 2021-04-04 LAB — COMPREHENSIVE METABOLIC PANEL
ALT: 32 IU/L (ref 0–32)
AST: 33 IU/L (ref 0–40)
Albumin/Globulin Ratio: 1.9 (ref 1.2–2.2)
Albumin: 4.4 g/dL (ref 3.7–4.7)
Alkaline Phosphatase: 102 IU/L (ref 44–121)
BUN/Creatinine Ratio: 16 (ref 12–28)
BUN: 25 mg/dL (ref 8–27)
Bilirubin Total: 0.4 mg/dL (ref 0.0–1.2)
CO2: 29 mmol/L (ref 20–29)
Calcium: 8.9 mg/dL (ref 8.7–10.3)
Chloride: 96 mmol/L (ref 96–106)
Creatinine, Ser: 1.6 mg/dL — ABNORMAL HIGH (ref 0.57–1.00)
Globulin, Total: 2.3 g/dL (ref 1.5–4.5)
Glucose: 109 mg/dL — ABNORMAL HIGH (ref 65–99)
Potassium: 3.3 mmol/L — ABNORMAL LOW (ref 3.5–5.2)
Sodium: 141 mmol/L (ref 134–144)
Total Protein: 6.7 g/dL (ref 6.0–8.5)
eGFR: 33 mL/min/{1.73_m2} — ABNORMAL LOW (ref >59)

## 2021-04-04 LAB — LIPID PANEL
Chol/HDL Ratio: 3 ratio (ref 0.0–4.4)
Cholesterol, Total: 135 mg/dL (ref 100–199)
HDL: 45 mg/dL (ref >39)
LDL Chol Calc (NIH): 61 mg/dL (ref 0–99)
Triglycerides: 173 mg/dL — ABNORMAL HIGH (ref 0–149)
VLDL Cholesterol Cal: 29 mg/dL (ref 5–40)

## 2021-04-04 LAB — HEMOGLOBIN A1C
Est. average glucose Bld gHb Est-mCnc: 134 mg/dL
Hgb A1c MFr Bld: 6.3 % — ABNORMAL HIGH (ref 4.8–5.6)

## 2021-04-04 LAB — CARDIOVASCULAR RISK ASSESSMENT

## 2021-04-06 ENCOUNTER — Encounter: Payer: Self-pay | Admitting: Family Medicine

## 2021-04-06 ENCOUNTER — Ambulatory Visit (INDEPENDENT_AMBULATORY_CARE_PROVIDER_SITE_OTHER): Payer: Medicare Other | Admitting: Family Medicine

## 2021-04-06 ENCOUNTER — Other Ambulatory Visit: Payer: Self-pay

## 2021-04-06 VITALS — BP 124/80 | HR 72 | Temp 97.6°F | Resp 16 | Ht 66.0 in | Wt 156.6 lb

## 2021-04-06 DIAGNOSIS — M5416 Radiculopathy, lumbar region: Secondary | ICD-10-CM

## 2021-04-06 DIAGNOSIS — E782 Mixed hyperlipidemia: Secondary | ICD-10-CM | POA: Diagnosis not present

## 2021-04-06 DIAGNOSIS — I129 Hypertensive chronic kidney disease with stage 1 through stage 4 chronic kidney disease, or unspecified chronic kidney disease: Secondary | ICD-10-CM

## 2021-04-06 DIAGNOSIS — R5383 Other fatigue: Secondary | ICD-10-CM | POA: Diagnosis not present

## 2021-04-06 DIAGNOSIS — R7301 Impaired fasting glucose: Secondary | ICD-10-CM

## 2021-04-06 DIAGNOSIS — R937 Abnormal findings on diagnostic imaging of other parts of musculoskeletal system: Secondary | ICD-10-CM

## 2021-04-06 DIAGNOSIS — M545 Low back pain, unspecified: Secondary | ICD-10-CM | POA: Diagnosis not present

## 2021-04-06 DIAGNOSIS — N1832 Chronic kidney disease, stage 3b: Secondary | ICD-10-CM

## 2021-04-06 MED ORDER — OMEGA-3-ACID ETHYL ESTERS 1 G PO CAPS: 2.0000 g | ORAL_CAPSULE | Freq: Two times a day (BID) | ORAL | 0 refills | Status: DC

## 2021-04-06 MED ORDER — ICOSAPENT ETHYL 1 G PO CAPS: 2.0000 g | ORAL_CAPSULE | Freq: Two times a day (BID) | ORAL | 2 refills | Status: DC
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
## 2021-04-08 ENCOUNTER — Other Ambulatory Visit: Payer: Self-pay | Admitting: Family Medicine

## 2021-04-08 LAB — TSH: TSH: 0.308 u[IU]/mL — ABNORMAL LOW (ref 0.450–4.500)

## 2021-04-08 LAB — SPECIMEN STATUS REPORT

## 2021-04-10 ENCOUNTER — Other Ambulatory Visit: Payer: Self-pay | Admitting: Physician Assistant

## 2021-04-11 ENCOUNTER — Other Ambulatory Visit: Payer: Self-pay

## 2021-04-11 DIAGNOSIS — I129 Hypertensive chronic kidney disease with stage 1 through stage 4 chronic kidney disease, or unspecified chronic kidney disease: Secondary | ICD-10-CM

## 2021-04-11 DIAGNOSIS — N1832 Chronic kidney disease, stage 3b: Secondary | ICD-10-CM

## 2021-04-12 ENCOUNTER — Telehealth: Payer: Self-pay

## 2021-04-12 NOTE — Telephone Encounter (Signed)
PA submitted and approved for Icosapent Ethyl via covermymeds.

## 2021-04-24 ENCOUNTER — Other Ambulatory Visit: Payer: Self-pay | Admitting: Family Medicine

## 2021-04-24 DIAGNOSIS — M545 Low back pain, unspecified: Secondary | ICD-10-CM | POA: Diagnosis not present

## 2021-04-24 DIAGNOSIS — M5416 Radiculopathy, lumbar region: Secondary | ICD-10-CM | POA: Diagnosis not present

## 2021-04-24 DIAGNOSIS — K219 Gastro-esophageal reflux disease without esophagitis: Secondary | ICD-10-CM

## 2021-04-24 DIAGNOSIS — M1A0710 Idiopathic chronic gout, right ankle and foot, without tophus (tophi): Secondary | ICD-10-CM

## 2021-04-27 DIAGNOSIS — M62838 Other muscle spasm: Secondary | ICD-10-CM | POA: Diagnosis not present

## 2021-04-27 DIAGNOSIS — M545 Low back pain, unspecified: Secondary | ICD-10-CM | POA: Diagnosis not present

## 2021-04-27 DIAGNOSIS — M4726 Other spondylosis with radiculopathy, lumbar region: Secondary | ICD-10-CM | POA: Diagnosis not present

## 2021-05-02 DIAGNOSIS — M5416 Radiculopathy, lumbar region: Secondary | ICD-10-CM | POA: Diagnosis not present

## 2021-05-16 DIAGNOSIS — M5416 Radiculopathy, lumbar region: Secondary | ICD-10-CM | POA: Diagnosis not present

## 2021-05-25 DIAGNOSIS — M5416 Radiculopathy, lumbar region: Secondary | ICD-10-CM | POA: Diagnosis not present

## 2021-06-05 DIAGNOSIS — E785 Hyperlipidemia, unspecified: Secondary | ICD-10-CM | POA: Diagnosis not present

## 2021-06-05 DIAGNOSIS — R809 Proteinuria, unspecified: Secondary | ICD-10-CM | POA: Diagnosis not present

## 2021-06-05 DIAGNOSIS — N281 Cyst of kidney, acquired: Secondary | ICD-10-CM | POA: Diagnosis not present

## 2021-06-05 DIAGNOSIS — I129 Hypertensive chronic kidney disease with stage 1 through stage 4 chronic kidney disease, or unspecified chronic kidney disease: Secondary | ICD-10-CM | POA: Diagnosis not present

## 2021-06-05 DIAGNOSIS — N1832 Chronic kidney disease, stage 3b: Secondary | ICD-10-CM | POA: Diagnosis not present

## 2021-06-16 DIAGNOSIS — M4696 Unspecified inflammatory spondylopathy, lumbar region: Secondary | ICD-10-CM | POA: Diagnosis not present

## 2021-06-16 DIAGNOSIS — M545 Low back pain, unspecified: Secondary | ICD-10-CM | POA: Diagnosis not present

## 2021-06-16 DIAGNOSIS — M5416 Radiculopathy, lumbar region: Secondary | ICD-10-CM | POA: Diagnosis not present

## 2021-06-22 ENCOUNTER — Other Ambulatory Visit: Payer: Self-pay | Admitting: Family Medicine

## 2021-06-22 ENCOUNTER — Other Ambulatory Visit: Payer: Self-pay | Admitting: Legal Medicine

## 2021-06-22 DIAGNOSIS — M1A0710 Idiopathic chronic gout, right ankle and foot, without tophus (tophi): Secondary | ICD-10-CM

## 2021-07-12 NOTE — Progress Notes (Signed)
Subjective:  Patient ID: Pamela Schwartz, female    DOB: 1941/02/05  Age: 80 y.o. MRN: [id number]  Chief Complaint  Patient presents with   Hyperlipidemia   Hypertension    HPI Gout-taking allopurinol 100 mg daily and colchicine 0.6 when gout flares up HTN with chronic kidney disease stage III- norvasc 10 mg , chlorthalidone 25 mg, metoprolol 100 mg 3 tablets daily GERD-Pantoprazole 40 mg daily Hyperlipidemia-Crestor 40 g daily, Vascepa  Insomnia-ambien CR 12.5 qhs Back pain: on methocarbamol prn and tramadol prn. Usually takes one tramadol at night.  Diabetes:  Complications:nephropathy and ckd Glucose checking: none Most recent A1C: 6.3 Current medications: none Foot checks: daily Lifestyle changes: Eats healthy.    Current Outpatient Medications on File Prior to Visit  Medication Sig Dispense Refill   allopurinol (ZYLOPRIM) 100 MG tablet TAKE ONE TABLET BY MOUTH EVERY EVENING 90 tablet 1   amLODipine (NORVASC) 10 MG tablet TAKE ONE TABLET BY MOUTH EVERY EVENING 90 tablet 3   aspirin 81 MG tablet Take 81 mg by mouth daily.     chlorthalidone (HYGROTON) 25 MG tablet TAKE ONE TABLET BY MOUTH EVERY DAY 90 tablet 3   colchicine 0.6 MG tablet Take 1 TABLET by mouth in the morning and at bedtime. X 1 week for gout flare. 14 tablet 2   metoprolol succinate (TOPROL-XL) 100 MG 24 hr tablet TAKE 3 TABLETS BY MOUTH EVERY DAY 270 tablet 3   Multiple Vitamins-Minerals (PRESERVISION AREDS 2 PO) Take by mouth.     pantoprazole (PROTONIX) 40 MG tablet TAKE ONE TABLET BY MOUTH EVERY DAY 90 tablet 0   rosuvastatin (CRESTOR) 40 MG tablet TAKE ONE TABLET BY MOUTH EVERY EVENING 90 tablet 1   traMADol (ULTRAM) 50 MG tablet TAKE ONE TABLET BY MOUTH EVERY 8 HOURS AS NEEDED FOR BACK PAIN 90 tablet 2   VASCEPA 1 g capsule TAKE 2 CAPSULES BY MOUTH TWICE DAILY 360 capsule 1   Vitamin D, Ergocalciferol, (DRISDOL) 1.25 MG (50000 UNIT) CAPS capsule TAKE ONE CAPSULE BY MOUTH ON SUNDAYS 12 capsule 3    zolpidem (AMBIEN CR) 12.5 MG CR tablet TAKE ONE TABLET BY MOUTH AT BEDTIME 30 tablet 5   No current facility-administered medications on file prior to visit.   Past Medical History:  Diagnosis Date   Age-related osteoporosis without current pathological fracture    Arthritis    Barrett's esophagus without dysplasia    Cancer ([place])    denies   Chronic kidney disease    Colitis 05/25/2020   GERD (gastroesophageal reflux disease)    Hearing loss    History of COVID-19    Hypercholesterolemia    Hypertension    Hypertensive chronic kidney disease with stage 1 through stage 4 chronic kidney disease, or unspecified chronic kidney disease    IBS (irritable bowel syndrome)    Impaired fasting glucose    Low back pain    Mixed hyperlipidemia    Paresthesia of skin    Primary insomnia    Scars    Vitamin D deficiency, unspecified    Past Surgical History:  Procedure Laterality Date   ANTERIOR CERVICAL DECOMP/DISCECTOMY FUSION N/A 03/06/2016   Procedure: ACDF - C5-C6 - C6-C7  ;  Surgeon: Earnie Larsson, MD;  Location: [facility];  Service: Neurosurgery;  Laterality: N/A;  ACDF - C5-C6 - C6-C7     APPENDECTOMY     BUNIONECTOMY     right and left   CARDIAC CATHETERIZATION     had in  [place] around 2012   CATARACT EXTRACTION W/ INTRAOCULAR LENS  IMPLANT, BILATERAL     CESAREAN SECTION     COLONOSCOPY W/ BIOPSIES AND POLYPECTOMY     MULTIPLE TOOTH EXTRACTIONS     TUBAL LIGATION      Family History  Problem Relation Age of Onset   Heart attack Mother    Heart attack Father    Hypertension Sister    Hypertension Brother    Renal Disease Brother    Heart attack Brother    Multiple myeloma Brother    Hypertension Sister    Hypertension Sister    Heart attack Sister    Social History   Socioeconomic History   Marital status: Married    Spouse name: Not on file   Number of children: 3   Years of education: Not on file   Highest education level: Not on file  Occupational  History   Occupation: Retired Therapist, sports  Tobacco Use   Smoking status: Former    Packs/day: 0.50    Types: Cigarettes    Quit date: 10/02/2015    Years since quitting: 5.7   Smokeless tobacco: Never  Vaping Use   Vaping Use: Never used  Substance and Sexual Activity   Alcohol use: No   Drug use: No   Sexual activity: Not on file  Other Topics Concern   Not on file  Social History Narrative   Not on file   Social Determinants of Health   Financial Resource Strain: Not on file  Food Insecurity: No Food Insecurity   Worried About Running Out of Food in the Last Year: Never true   [place] in the Last Year: Never true  Transportation Needs: No Transportation Needs   Lack of Transportation (Medical): No   Lack of Transportation (Non-Medical): No  Physical Activity: Not on file  Stress: No Stress Concern Present   Feeling of Stress : Not at all  Social Connections: Not on file    Review of Systems  Constitutional:  Positive for unexpected weight change. Negative for chills, fatigue and fever.  HENT:  Negative for congestion, ear pain, rhinorrhea and sore throat.   Respiratory:  Negative for cough and shortness of breath.   Cardiovascular:  Negative for chest pain.  Gastrointestinal:  Positive for nausea and vomiting. Negative for abdominal pain, constipation and diarrhea.  Genitourinary:  Negative for dysuria and urgency.  Musculoskeletal:  Positive for arthralgias, back pain and myalgias.  Neurological:  Negative for dizziness, weakness, light-headedness and headaches.  Psychiatric/Behavioral:  Negative for dysphoric mood. The patient is not nervous/anxious.     Objective:  BP 130/76   Pulse 78   Temp 98.6 F (37 C)   Resp 16   Ht $R'5\' 6"'mJ$  (1.676 m)   Wt 145 lb 9.6 oz (66 kg)   BMI 23.50 kg/m   BP/Weight 07/13/2021 04/06/2021 2/68/3419  Systolic BP 622 297 989  Diastolic BP 76 80 80  Wt. (Lbs) 145.6 156.6 154.6  BMI 23.5 25.28 24.95    Physical Exam Vitals  reviewed.  Constitutional:      Appearance: Normal appearance. She is normal weight.  Neck:     Vascular: No carotid bruit.  Cardiovascular:     Rate and Rhythm: Normal rate and regular rhythm.     Pulses: Normal pulses.     Heart sounds: Normal heart sounds.  Pulmonary:     Effort: Pulmonary effort is normal. No respiratory distress.     Breath  sounds: Normal breath sounds.  Abdominal:     General: Abdomen is flat. Bowel sounds are normal.     Palpations: Abdomen is soft.     Tenderness: There is no abdominal tenderness.  Neurological:     Mental Status: She is alert and oriented to person, place, and time.  Psychiatric:        Mood and Affect: Mood normal.        Behavior: Behavior normal.    Diabetic Foot Exam - Simple   Simple Foot Form Diabetic Foot exam was performed with the following findings: Yes 07/13/2021  7:45 AM  Visual Inspection No deformities, no ulcerations, no other skin breakdown bilaterally: Yes Sensation Testing Intact to touch and monofilament testing bilaterally: Yes Pulse Check Posterior Tibialis and Dorsalis pulse intact bilaterally: Yes Comments      Lab Results  Component Value Date   WBC 8.5 07/13/2021   HGB 14.8 07/13/2021   HCT 44.2 07/13/2021   PLT 170 07/13/2021   GLUCOSE 113 (H) 07/13/2021   CHOL 124 07/13/2021   TRIG 136 07/13/2021   HDL 49 07/13/2021   LDLCALC 51 07/13/2021   ALT 28 07/13/2021   AST 32 07/13/2021   NA 140 07/13/2021   K 4.1 07/13/2021   CL 99 07/13/2021   CREATININE 1.36 (H) 07/13/2021   BUN 15 07/13/2021   CO2 27 07/13/2021   TSH 0.308 (L) 04/03/2021   HGBA1C 6.2 (H) 07/13/2021      Assessment & Plan:   1. Hypertensive kidney disease with stage 3b chronic kidney disease ([place]) Well-controlled. Continue chlorthalidone, metoprolol. 2. Mixed hyperlipidemia The current medical regimen is effective;  continue present plan and medications. - Lipid panel  3. Aortic atherosclerosis.  Continue  statin.  4.  Diabetes with stage III chronic kidney disease Well-controlled. Patient does not need to check her sugars. I do recommend checking her feet daily and having an annual eye exam. Recommend start on farxiga 5 mg once daily for treatment of the chronic kidney disease.  On lisinopril 5 mg once daily.  Patient has seen nephrology.   Labs drawn today.   - CBC with Differential/Platelet - Hemoglobin A1c  5. Flu vaccine need - Flu Vaccine QUAD High Dose(Fluad)   Meds ordered this encounter  Medications   promethazine (PHENERGAN) 25 MG tablet    Sig: take 1 tablet (25 mg) by oral route 4 times per day AS NEEDED nausea/vomiting]    Dispense:  60 tablet    Refill:  3    This prescription was filled on 10/11/2020. Any refills authorized will be placed on file.   dapagliflozin propanediol (FARXIGA) 5 MG TABS tablet    Sig: Take 1 tablet (5 mg total) by mouth daily before breakfast.    Dispense:  90 tablet    Refill:  0   lisinopril (ZESTRIL) 5 MG tablet    Sig: Take 1 tablet (5 mg total) by mouth daily.    Dispense:  90 tablet    Refill:  0     Orders Placed This Encounter  Procedures   Flu Vaccine QUAD High Dose(Fluad)   CBC with Differential/Platelet   Comprehensive metabolic panel   Lipid panel   Hemoglobin A1c   Cardiovascular Risk Assessment      Follow-up: Return in about 3 months (around 10/12/2021) for fasting.  An After Visit Summary was printed and given to the patient.  Rochel Brome, MD [facility] [phone]

## 2021-07-13 ENCOUNTER — Other Ambulatory Visit: Payer: Self-pay

## 2021-07-13 ENCOUNTER — Ambulatory Visit (INDEPENDENT_AMBULATORY_CARE_PROVIDER_SITE_OTHER): Payer: Medicare Other | Admitting: Family Medicine

## 2021-07-13 VITALS — BP 130/76 | HR 78 | Temp 98.6°F | Resp 16 | Ht 66.0 in | Wt 145.6 lb

## 2021-07-13 DIAGNOSIS — R7303 Prediabetes: Secondary | ICD-10-CM

## 2021-07-13 DIAGNOSIS — I700 Atherosclerosis of aorta: Secondary | ICD-10-CM

## 2021-07-13 DIAGNOSIS — Z23 Encounter for immunization: Secondary | ICD-10-CM | POA: Diagnosis not present

## 2021-07-13 DIAGNOSIS — E1122 Type 2 diabetes mellitus with diabetic chronic kidney disease: Secondary | ICD-10-CM

## 2021-07-13 DIAGNOSIS — N183 Chronic kidney disease, stage 3 unspecified: Secondary | ICD-10-CM | POA: Diagnosis not present

## 2021-07-13 DIAGNOSIS — N1832 Chronic kidney disease, stage 3b: Secondary | ICD-10-CM

## 2021-07-13 DIAGNOSIS — I129 Hypertensive chronic kidney disease with stage 1 through stage 4 chronic kidney disease, or unspecified chronic kidney disease: Secondary | ICD-10-CM | POA: Diagnosis not present

## 2021-07-13 DIAGNOSIS — E782 Mixed hyperlipidemia: Secondary | ICD-10-CM

## 2021-07-13 DIAGNOSIS — I10 Essential (primary) hypertension: Secondary | ICD-10-CM | POA: Diagnosis not present

## 2021-07-13 MED ORDER — PROMETHAZINE HCL 25 MG PO TABS: ORAL_TABLET | ORAL | 3 refills | Status: DC

## 2021-07-13 MED ORDER — DAPAGLIFLOZIN PROPANEDIOL 5 MG PO TABS: 5.0000 mg | ORAL_TABLET | Freq: Every day | ORAL | 0 refills | Status: DC

## 2021-07-13 MED ORDER — LISINOPRIL 5 MG PO TABS: 5.0000 mg | ORAL_TABLET | Freq: Every day | ORAL | 0 refills | Status: DC

## 2021-07-13 NOTE — Patient Instructions (Signed)
Diabetic recommendations: Visits with PCP every 3-6 months depending on Hemoglobin A1C control (goal is < 7.0.) Dietary avoidance/limitation of sugar and carbohydrates, fat, and salt. Exercise (aerobic) 3-5 days per week.  Kidney Care:  Urine microalbumin (protein) should be checked at least annually. To treat or prevent nephropathy (leaking of protein in urine) - all patients should be on an ACE or an ARB Medicines. Heart Disease Prevention: All diabetics should be on a statin cholesterol medicine regardless of cholesterol levels. If cholesterol levels are high, recommend treat to goal of LDL cholesterol level of less than 70. Diabetic Eye Care: See an eye doctor at least annually. More if recommended by the eye doctor.  Foot Care: Check feet visually daily. Blood pressure (hypertension) control < 130/85 Vaccinations: annual flu shot, pneumovax 23, tetanus (tdap), covid 19 shot.  Start lisinopril 5 mg once daily.  Start farxiga 5 mg once daily.

## 2021-07-14 LAB — CBC WITH DIFFERENTIAL/PLATELET
Basophils Absolute: 0 10*3/uL (ref 0.0–0.2)
Basos: 0 %
EOS (ABSOLUTE): 0.2 10*3/uL (ref 0.0–0.4)
Eos: 2 %
Hematocrit: 44.2 % (ref 34.0–46.6)
Hemoglobin: 14.8 g/dL (ref 11.1–15.9)
Immature Grans (Abs): 0 10*3/uL (ref 0.0–0.1)
Immature Granulocytes: 0 %
Lymphocytes Absolute: 2.3 10*3/uL (ref 0.7–3.1)
Lymphs: 27 %
MCH: 30.4 pg (ref 26.6–33.0)
MCHC: 33.5 g/dL (ref 31.5–35.7)
MCV: 91 fL (ref 79–97)
Monocytes Absolute: 0.8 10*3/uL (ref 0.1–0.9)
Monocytes: 9 %
Neutrophils Absolute: 5.1 10*3/uL (ref 1.4–7.0)
Neutrophils: 62 %
Platelets: 170 10*3/uL (ref 150–450)
RBC: 4.87 x10E6/uL (ref 3.77–5.28)
RDW: 13.7 % (ref 11.7–15.4)
WBC: 8.5 10*3/uL (ref 3.4–10.8)

## 2021-07-14 LAB — COMPREHENSIVE METABOLIC PANEL
ALT: 28 IU/L (ref 0–32)
AST: 32 IU/L (ref 0–40)
Albumin/Globulin Ratio: 1.9 (ref 1.2–2.2)
Albumin: 4.4 g/dL (ref 3.7–4.7)
Alkaline Phosphatase: 83 IU/L (ref 44–121)
BUN/Creatinine Ratio: 11 — ABNORMAL LOW (ref 12–28)
BUN: 15 mg/dL (ref 8–27)
Bilirubin Total: 0.6 mg/dL (ref 0.0–1.2)
CO2: 27 mmol/L (ref 20–29)
Calcium: 9.9 mg/dL (ref 8.7–10.3)
Chloride: 99 mmol/L (ref 96–106)
Creatinine, Ser: 1.36 mg/dL — ABNORMAL HIGH (ref 0.57–1.00)
Globulin, Total: 2.3 g/dL (ref 1.5–4.5)
Glucose: 113 mg/dL — ABNORMAL HIGH (ref 65–99)
Potassium: 4.1 mmol/L (ref 3.5–5.2)
Sodium: 140 mmol/L (ref 134–144)
Total Protein: 6.7 g/dL (ref 6.0–8.5)
eGFR: 40 mL/min/{1.73_m2} — ABNORMAL LOW (ref >59)

## 2021-07-14 LAB — LIPID PANEL
Chol/HDL Ratio: 2.5 ratio (ref 0.0–4.4)
Cholesterol, Total: 124 mg/dL (ref 100–199)
HDL: 49 mg/dL (ref >39)
LDL Chol Calc (NIH): 51 mg/dL (ref 0–99)
Triglycerides: 136 mg/dL (ref 0–149)
VLDL Cholesterol Cal: 24 mg/dL (ref 5–40)

## 2021-07-14 LAB — CARDIOVASCULAR RISK ASSESSMENT

## 2021-07-14 LAB — HEMOGLOBIN A1C
Est. average glucose Bld gHb Est-mCnc: 131 mg/dL
Hgb A1c MFr Bld: 6.2 % — ABNORMAL HIGH (ref 4.8–5.6)

## 2021-07-15 DIAGNOSIS — W19XXXA Unspecified fall, initial encounter: Secondary | ICD-10-CM | POA: Diagnosis not present

## 2021-07-15 DIAGNOSIS — S199XXA Unspecified injury of neck, initial encounter: Secondary | ICD-10-CM | POA: Diagnosis not present

## 2021-07-15 DIAGNOSIS — S0240DA Maxillary fracture, left side, initial encounter for closed fracture: Secondary | ICD-10-CM | POA: Diagnosis not present

## 2021-07-15 DIAGNOSIS — S0101XA Laceration without foreign body of scalp, initial encounter: Secondary | ICD-10-CM | POA: Diagnosis not present

## 2021-07-15 DIAGNOSIS — S0181XA Laceration without foreign body of other part of head, initial encounter: Secondary | ICD-10-CM | POA: Diagnosis not present

## 2021-07-15 DIAGNOSIS — Z23 Encounter for immunization: Secondary | ICD-10-CM | POA: Diagnosis not present

## 2021-07-15 DIAGNOSIS — R58 Hemorrhage, not elsewhere classified: Secondary | ICD-10-CM | POA: Diagnosis not present

## 2021-07-15 DIAGNOSIS — S02401A Maxillary fracture, unspecified, initial encounter for closed fracture: Secondary | ICD-10-CM | POA: Diagnosis not present

## 2021-07-17 ENCOUNTER — Encounter: Payer: Self-pay | Admitting: Family Medicine

## 2021-07-17 DIAGNOSIS — Z23 Encounter for immunization: Secondary | ICD-10-CM

## 2021-07-17 DIAGNOSIS — N183 Chronic kidney disease, stage 3 unspecified: Secondary | ICD-10-CM | POA: Insufficient documentation

## 2021-07-17 DIAGNOSIS — N1832 Chronic kidney disease, stage 3b: Secondary | ICD-10-CM | POA: Insufficient documentation

## 2021-07-17 DIAGNOSIS — E1122 Type 2 diabetes mellitus with diabetic chronic kidney disease: Secondary | ICD-10-CM

## 2021-07-17 DIAGNOSIS — N184 Chronic kidney disease, stage 4 (severe): Secondary | ICD-10-CM | POA: Insufficient documentation

## 2021-07-17 HISTORY — DX: Type 2 diabetes mellitus with diabetic chronic kidney disease: E11.22

## 2021-07-17 HISTORY — DX: Encounter for immunization: Z23

## 2021-07-21 ENCOUNTER — Ambulatory Visit (INDEPENDENT_AMBULATORY_CARE_PROVIDER_SITE_OTHER): Payer: Medicare Other | Admitting: Family Medicine

## 2021-07-21 ENCOUNTER — Other Ambulatory Visit: Payer: Self-pay

## 2021-07-21 VITALS — BP 138/80 | HR 78 | Temp 96.9°F | Resp 16 | Ht 63.0 in | Wt 145.2 lb

## 2021-07-21 DIAGNOSIS — S02401A Maxillary fracture, unspecified, initial encounter for closed fracture: Secondary | ICD-10-CM

## 2021-07-21 DIAGNOSIS — E876 Hypokalemia: Secondary | ICD-10-CM

## 2021-07-21 DIAGNOSIS — S1191XA Laceration without foreign body of unspecified part of neck, initial encounter: Secondary | ICD-10-CM

## 2021-07-21 DIAGNOSIS — S01112A Laceration without foreign body of left eyelid and periocular area, initial encounter: Secondary | ICD-10-CM

## 2021-07-21 DIAGNOSIS — W101XXA Fall (on)(from) sidewalk curb, initial encounter: Secondary | ICD-10-CM

## 2021-07-21 DIAGNOSIS — S0181XA Laceration without foreign body of other part of head, initial encounter: Secondary | ICD-10-CM

## 2021-07-21 NOTE — Progress Notes (Signed)
Subjective:  Patient ID: Pamela Schwartz, female    DOB: 1941/08/24  Age: 80 y.o. MRN: [id number]  Chief Complaint  Patient presents with   Hospitalization Follow-up    [name] comes in for Emergency Department follow-up.  She was seen in the Emergency Department on 07/15/2021. She was getting out of the car carrying a covered dish when she tripped over a small bush. She sustained multiple facial lacerations and a nondisplaced fracture of the left maxillary sinus. She also was found to have hypokalemia and was sent home on potassium and augmentin.     Current Outpatient Medications on File Prior to Visit  Medication Sig Dispense Refill   amoxicillin-clavulanate (AUGMENTIN) 875-125 MG tablet Take 1 tablet by mouth 2 (two) times daily.     HYDROcodone-acetaminophen (NORCO) 7.5-325 MG tablet Take 1 tablet by mouth every 6 (six) hours as needed for moderate pain.     allopurinol (ZYLOPRIM) 100 MG tablet TAKE ONE TABLET BY MOUTH EVERY EVENING 90 tablet 1   amLODipine (NORVASC) 10 MG tablet TAKE ONE TABLET BY MOUTH EVERY EVENING 90 tablet 3   aspirin 81 MG tablet Take 81 mg by mouth daily.     chlorthalidone (HYGROTON) 25 MG tablet TAKE ONE TABLET BY MOUTH EVERY DAY 90 tablet 3   colchicine 0.6 MG tablet Take 1 TABLET by mouth in the morning and at bedtime. X 1 week for gout flare. 14 tablet 2   dapagliflozin propanediol (FARXIGA) 5 MG TABS tablet Take 1 tablet (5 mg total) by mouth daily before breakfast. 90 tablet 0   lisinopril (ZESTRIL) 5 MG tablet Take 1 tablet (5 mg total) by mouth daily. 90 tablet 0   metoprolol succinate (TOPROL-XL) 100 MG 24 hr tablet TAKE 3 TABLETS BY MOUTH EVERY DAY 270 tablet 3   Multiple Vitamins-Minerals (PRESERVISION AREDS 2 PO) Take by mouth.     pantoprazole (PROTONIX) 40 MG tablet TAKE ONE TABLET BY MOUTH EVERY DAY 90 tablet 0   promethazine (PHENERGAN) 25 MG tablet take 1 tablet (25 mg) by oral route 4 times per day AS NEEDED nausea/vomiting] 60 tablet 3    rosuvastatin (CRESTOR) 40 MG tablet TAKE ONE TABLET BY MOUTH EVERY EVENING 90 tablet 1   traMADol (ULTRAM) 50 MG tablet TAKE ONE TABLET BY MOUTH EVERY 8 HOURS AS NEEDED FOR BACK PAIN 90 tablet 2   VASCEPA 1 g capsule TAKE 2 CAPSULES BY MOUTH TWICE DAILY 360 capsule 1   Vitamin D, Ergocalciferol, (DRISDOL) 1.25 MG (50000 UNIT) CAPS capsule TAKE ONE CAPSULE BY MOUTH ON SUNDAYS 12 capsule 3   zolpidem (AMBIEN CR) 12.5 MG CR tablet TAKE ONE TABLET BY MOUTH AT BEDTIME 30 tablet 5   No current facility-administered medications on file prior to visit.   Past Medical History:  Diagnosis Date   Age-related osteoporosis without current pathological fracture    Arthritis    Barrett's esophagus without dysplasia    Cancer ([place])    denies   Chronic kidney disease    Colitis 05/25/2020   GERD (gastroesophageal reflux disease)    Hearing loss    History of COVID-19    Hypercholesterolemia    Hypertension    Hypertensive chronic kidney disease with stage 1 through stage 4 chronic kidney disease, or unspecified chronic kidney disease    IBS (irritable bowel syndrome)    Impaired fasting glucose    Low back pain    Mixed hyperlipidemia    Paresthesia of skin    Primary insomnia  Scars    Vitamin D deficiency, unspecified    Past Surgical History:  Procedure Laterality Date   ANTERIOR CERVICAL DECOMP/DISCECTOMY FUSION N/A 03/06/2016   Procedure: ACDF - C5-C6 - C6-C7  ;  Surgeon: Earnie Larsson, MD;  Location: [place] [facility];  Service: Neurosurgery;  Laterality: N/A;  ACDF - C5-C6 - C6-C7     APPENDECTOMY     BUNIONECTOMY     right and left   CARDIAC CATHETERIZATION     had in [place] around 2012   CATARACT EXTRACTION W/ INTRAOCULAR LENS  IMPLANT, BILATERAL     CESAREAN SECTION     COLONOSCOPY W/ BIOPSIES AND POLYPECTOMY     MULTIPLE TOOTH EXTRACTIONS     TUBAL LIGATION      Family History  Problem Relation Age of Onset   Heart attack Mother    Heart attack Father    Hypertension Sister     Hypertension Brother    Renal Disease Brother    Heart attack Brother    Multiple myeloma Brother    Hypertension Sister    Hypertension Sister    Heart attack Sister    Social History   Socioeconomic History   Marital status: Married    Spouse name: Not on file   Number of children: 3   Years of education: Not on file   Highest education level: Not on file  Occupational History   Occupation: Retired Therapist, sports  Tobacco Use   Smoking status: Former    Packs/day: 0.50    Types: Cigarettes    Quit date: 10/02/2015    Years since quitting: 5.8   Smokeless tobacco: Never  Vaping Use   Vaping Use: Never used  Substance and Sexual Activity   Alcohol use: No   Drug use: No   Sexual activity: Not on file  Other Topics Concern   Not on file  Social History Narrative   Not on file   Social Determinants of Health   Financial Resource Strain: Not on file  Food Insecurity: No Food Insecurity   Worried About Running Out of Food in the Last Year: Never true   [place] in the Last Year: Never true  Transportation Needs: No Transportation Needs   Lack of Transportation (Medical): No   Lack of Transportation (Non-Medical): No  Physical Activity: Not on file  Stress: No Stress Concern Present   Feeling of Stress : Not at all  Social Connections: Not on file    Review of Systems  Constitutional:  Negative for fever.  HENT:  Positive for facial swelling. Negative for congestion, ear pain and sore throat.   Neurological:  Negative for dizziness and headaches.    Objective:  BP 138/80   Pulse 78   Temp (!) 96.9 F (36.1 C)   Resp 16   Ht 5' 3" (1.6 m)   Wt 145 lb 3.2 oz (65.9 kg)   BMI 25.72 kg/m   BP/Weight 07/21/2021 07/13/2021 2/39/3594  Systolic BP 090 502 561  Diastolic BP 80 76 80  Wt. (Lbs) 145.2 145.6 156.6  BMI 25.72 23.5 25.28    Physical Exam Vitals reviewed.  Constitutional:      Appearance: She is normal weight.  Cardiovascular:     Rate and Rhythm:  Normal rate and regular rhythm.     Heart sounds: Normal heart sounds.  Pulmonary:     Effort: Pulmonary effort is normal.     Breath sounds: Normal breath sounds.  Skin:  Findings: Bruising (over left side of face. sutured lacerations on left forehead left anterior chin and left inferior chin/neck) present.  Neurological:     Mental Status: She is alert.    Diabetic Foot Exam - Simple   No data filed      Lab Results  Component Value Date   WBC 8.5 07/13/2021   HGB 14.8 07/13/2021   HCT 44.2 07/13/2021   PLT 170 07/13/2021   GLUCOSE 118 (H) 07/21/2021   CHOL 124 07/13/2021   TRIG 136 07/13/2021   HDL 49 07/13/2021   LDLCALC 51 07/13/2021   ALT 30 07/21/2021   AST 34 07/21/2021   NA 143 07/21/2021   K 4.5 07/21/2021   CL 103 07/21/2021   CREATININE 1.36 (H) 07/21/2021   BUN 15 07/21/2021   CO2 21 07/21/2021   TSH 0.308 (L) 04/03/2021   HGBA1C 6.2 (H) 07/13/2021      Assessment & Plan:   Problem List Items Addressed This Visit       Respiratory   Closed fracture of maxillary sinus ([place])    Keep follow up appt with Dr Gaylyn Cheers. ENT.        Other   Hypokalemia - Primary    Check potassium today.  Continue potassium chloride supplement.      Relevant Orders   Comprehensive metabolic panel (Completed)   Laceration of eyebrow and forehead    Sutures removed.      Laceration of skin of chin    Sutures removed      Laceration of neck    Sutures removed      Fall (on)(from) sidewalk curb, initial encounter    Recommended caution in the future. The patient is not at increased risk of falls, but rather this was an unusual circumstance.          Follow-up: No follow-ups on file.  An After Visit Summary was printed and given to the patient.  Rochel Brome, MD [facility] [phone]

## 2021-07-22 LAB — COMPREHENSIVE METABOLIC PANEL
ALT: 30 IU/L (ref 0–32)
AST: 34 IU/L (ref 0–40)
Albumin/Globulin Ratio: 2.4 — ABNORMAL HIGH (ref 1.2–2.2)
Albumin: 4.6 g/dL (ref 3.7–4.7)
Alkaline Phosphatase: 83 IU/L (ref 44–121)
BUN/Creatinine Ratio: 11 — ABNORMAL LOW (ref 12–28)
BUN: 15 mg/dL (ref 8–27)
Bilirubin Total: 0.4 mg/dL (ref 0.0–1.2)
CO2: 21 mmol/L (ref 20–29)
Calcium: 9.9 mg/dL (ref 8.7–10.3)
Chloride: 103 mmol/L (ref 96–106)
Creatinine, Ser: 1.36 mg/dL — ABNORMAL HIGH (ref 0.57–1.00)
Globulin, Total: 1.9 g/dL (ref 1.5–4.5)
Glucose: 118 mg/dL — ABNORMAL HIGH (ref 65–99)
Potassium: 4.5 mmol/L (ref 3.5–5.2)
Sodium: 143 mmol/L (ref 134–144)
Total Protein: 6.5 g/dL (ref 6.0–8.5)
eGFR: 40 mL/min/{1.73_m2} — ABNORMAL LOW (ref >59)

## 2021-07-23 ENCOUNTER — Encounter: Payer: Self-pay | Admitting: Family Medicine

## 2021-07-23 DIAGNOSIS — S1191XA Laceration without foreign body of unspecified part of neck, initial encounter: Secondary | ICD-10-CM | POA: Insufficient documentation

## 2021-07-23 DIAGNOSIS — S02401A Maxillary fracture, unspecified, initial encounter for closed fracture: Secondary | ICD-10-CM | POA: Insufficient documentation

## 2021-07-23 DIAGNOSIS — W101XXA Fall (on)(from) sidewalk curb, initial encounter: Secondary | ICD-10-CM | POA: Insufficient documentation

## 2021-07-23 DIAGNOSIS — E876 Hypokalemia: Secondary | ICD-10-CM | POA: Insufficient documentation

## 2021-07-23 DIAGNOSIS — S01119A Laceration without foreign body of unspecified eyelid and periocular area, initial encounter: Secondary | ICD-10-CM | POA: Insufficient documentation

## 2021-07-23 DIAGNOSIS — S0181XA Laceration without foreign body of other part of head, initial encounter: Secondary | ICD-10-CM | POA: Insufficient documentation

## 2021-07-23 HISTORY — DX: Maxillary fracture, unspecified side, initial encounter for closed fracture: S02.401A

## 2021-07-23 MED ORDER — POTASSIUM CHLORIDE CRYS ER 20 MEQ PO TBCR: 20.0000 meq | EXTENDED_RELEASE_TABLET | Freq: Two times a day (BID) | ORAL | 0 refills | Status: DC

## 2021-07-23 NOTE — Assessment & Plan Note (Signed)
Sutures removed.

## 2021-07-23 NOTE — Assessment & Plan Note (Signed)
Keep follow up appt with Dr Gaylyn Cheers. ENT.

## 2021-07-23 NOTE — Assessment & Plan Note (Signed)
Recommended caution in the future. The patient is not at increased risk of falls, but rather this was an unusual circumstance.

## 2021-07-23 NOTE — Assessment & Plan Note (Signed)
Check potassium today.  Continue potassium chloride supplement.

## 2021-07-25 DIAGNOSIS — S02401A Maxillary fracture, unspecified, initial encounter for closed fracture: Secondary | ICD-10-CM | POA: Diagnosis not present

## 2021-07-25 DIAGNOSIS — W19XXXA Unspecified fall, initial encounter: Secondary | ICD-10-CM | POA: Diagnosis not present

## 2021-07-25 DIAGNOSIS — H6121 Impacted cerumen, right ear: Secondary | ICD-10-CM | POA: Diagnosis not present

## 2021-07-25 DIAGNOSIS — J342 Deviated nasal septum: Secondary | ICD-10-CM | POA: Diagnosis not present

## 2021-07-25 DIAGNOSIS — T148XXA Other injury of unspecified body region, initial encounter: Secondary | ICD-10-CM | POA: Diagnosis not present

## 2021-07-25 DIAGNOSIS — S0181XA Laceration without foreign body of other part of head, initial encounter: Secondary | ICD-10-CM | POA: Diagnosis not present

## 2021-07-25 DIAGNOSIS — S0083XA Contusion of other part of head, initial encounter: Secondary | ICD-10-CM | POA: Diagnosis not present

## 2021-07-25 DIAGNOSIS — H61303 Acquired stenosis of external ear canal, unspecified, bilateral: Secondary | ICD-10-CM | POA: Diagnosis not present

## 2021-08-08 ENCOUNTER — Other Ambulatory Visit: Payer: Self-pay | Admitting: Family Medicine

## 2021-09-05 ENCOUNTER — Other Ambulatory Visit: Payer: Self-pay | Admitting: Legal Medicine

## 2021-09-05 DIAGNOSIS — K219 Gastro-esophageal reflux disease without esophagitis: Secondary | ICD-10-CM

## 2021-09-05 NOTE — Telephone Encounter (Signed)
This is your patient lp 

## 2021-09-20 ENCOUNTER — Other Ambulatory Visit: Payer: Self-pay | Admitting: Family Medicine

## 2021-09-20 DIAGNOSIS — N1832 Chronic kidney disease, stage 3b: Secondary | ICD-10-CM

## 2021-09-20 DIAGNOSIS — E1122 Type 2 diabetes mellitus with diabetic chronic kidney disease: Secondary | ICD-10-CM

## 2021-09-20 DIAGNOSIS — I129 Hypertensive chronic kidney disease with stage 1 through stage 4 chronic kidney disease, or unspecified chronic kidney disease: Secondary | ICD-10-CM

## 2021-09-20 DIAGNOSIS — N183 Chronic kidney disease, stage 3 unspecified: Secondary | ICD-10-CM

## 2021-10-10 ENCOUNTER — Other Ambulatory Visit: Payer: Self-pay | Admitting: Family Medicine

## 2021-10-17 ENCOUNTER — Other Ambulatory Visit: Payer: Self-pay

## 2021-10-17 ENCOUNTER — Encounter: Payer: Self-pay | Admitting: Family Medicine

## 2021-10-17 ENCOUNTER — Ambulatory Visit (INDEPENDENT_AMBULATORY_CARE_PROVIDER_SITE_OTHER): Payer: Medicare Other | Admitting: Family Medicine

## 2021-10-17 VITALS — BP 124/82 | HR 72 | Temp 97.3°F | Resp 14 | Ht 63.0 in | Wt 142.0 lb

## 2021-10-17 DIAGNOSIS — E1122 Type 2 diabetes mellitus with diabetic chronic kidney disease: Secondary | ICD-10-CM

## 2021-10-17 DIAGNOSIS — N183 Chronic kidney disease, stage 3 unspecified: Secondary | ICD-10-CM

## 2021-10-17 DIAGNOSIS — I700 Atherosclerosis of aorta: Secondary | ICD-10-CM | POA: Diagnosis not present

## 2021-10-17 DIAGNOSIS — E782 Mixed hyperlipidemia: Secondary | ICD-10-CM | POA: Diagnosis not present

## 2021-10-17 DIAGNOSIS — M1A0710 Idiopathic chronic gout, right ankle and foot, without tophus (tophi): Secondary | ICD-10-CM | POA: Diagnosis not present

## 2021-10-17 DIAGNOSIS — I129 Hypertensive chronic kidney disease with stage 1 through stage 4 chronic kidney disease, or unspecified chronic kidney disease: Secondary | ICD-10-CM

## 2021-10-17 DIAGNOSIS — E559 Vitamin D deficiency, unspecified: Secondary | ICD-10-CM | POA: Diagnosis not present

## 2021-10-17 DIAGNOSIS — E876 Hypokalemia: Secondary | ICD-10-CM | POA: Diagnosis not present

## 2021-10-17 DIAGNOSIS — N1832 Chronic kidney disease, stage 3b: Secondary | ICD-10-CM | POA: Diagnosis not present

## 2021-10-17 DIAGNOSIS — R946 Abnormal results of thyroid function studies: Secondary | ICD-10-CM | POA: Diagnosis not present

## 2021-10-17 LAB — POCT UA - MICROALBUMIN: Microalbumin Ur, POC: 150 mg/L

## 2021-10-17 NOTE — Progress Notes (Signed)
Subjective:  Patient ID: Pamela Schwartz, female    DOB: Apr 12, 1941  Age: 80 y.o. MRN: [id number]  Chief Complaint  Patient presents with   Diabetes   Hyperlipidemia   Hypertension    HPI Diabetes:  Complications: CKD Most recent A1C: 6.2 Current medications: farxiga 5 mg once daily.  Last Eye Exam: [facility] in [place], [place] Foot checks: daily.   Hyperlipidemia: Current medications: crestor 40 mg once daily and vascepa 1 gm 2 capsules twice daily.   GERD: on protonix 40 mg one daily.   Vitamin D deficiency: Vitamin D 50000 once weekly.   Hypertension: Complications: Current medications:  norvasc 10 mg , chlorthalidone 25 mg, metoprolol 100 mg 3 tablets daily  Diet: healthy Exercise: walk  Chronic back pain: Saw Dr. Lynann Bologna and Dr. Mina Marble. Given ESI and had good result. Has  No further gout flare ups. ON allopurinol and colchicine prn.   Insomnia: ambien cr 12.5 mg once daily at night.   Hypokalemia: pt stopped potassium chloride about 2 months ago.   Current Outpatient Medications on File Prior to Visit  Medication Sig Dispense Refill   FARXIGA 5 MG TABS tablet TAKE ONE TABLET BY MOUTH EVERY DAY 90 tablet 0   allopurinol (ZYLOPRIM) 100 MG tablet TAKE ONE TABLET BY MOUTH EVERY EVENING 90 tablet 1   amLODipine (NORVASC) 10 MG tablet TAKE ONE TABLET BY MOUTH EVERY EVENING 90 tablet 3   aspirin 81 MG tablet Take 81 mg by mouth daily.     chlorthalidone (HYGROTON) 25 MG tablet TAKE ONE TABLET BY MOUTH EVERY DAY 90 tablet 3   colchicine 0.6 MG tablet Take 1 TABLET by mouth in the morning and at bedtime. X 1 week for gout flare. 14 tablet 2   lisinopril (ZESTRIL) 5 MG tablet Take 1 tablet (5 mg total) by mouth daily. 90 tablet 0   metoprolol succinate (TOPROL-XL) 100 MG 24 hr tablet TAKE 3 TABLETS BY MOUTH EVERY DAY 270 tablet 3   Multiple Vitamins-Minerals (PRESERVISION AREDS 2 PO) Take by mouth.     pantoprazole (PROTONIX) 40 MG tablet TAKE ONE TABLET BY MOUTH EVERY  DAY 90 tablet 0   potassium chloride SA (KLOR-CON) 20 MEQ tablet Take 1 tablet (20 mEq total) by mouth 2 (two) times daily. 180 tablet 0   promethazine (PHENERGAN) 25 MG tablet take 1 tablet (25 mg) by oral route 4 times per day AS NEEDED nausea/vomiting] 60 tablet 3   rosuvastatin (CRESTOR) 40 MG tablet TAKE ONE TABLET BY MOUTH EVERY EVENING 90 tablet 1   traMADol (ULTRAM) 50 MG tablet TAKE ONE TABLET BY MOUTH EVERY 8 HOURS AS NEEDED FOR BACK PAIN 90 tablet 2   VASCEPA 1 g capsule TAKE 2 CAPSULES BY MOUTH TWICE DAILY 360 capsule 1   Vitamin D, Ergocalciferol, (DRISDOL) 1.25 MG (50000 UNIT) CAPS capsule TAKE ONE CAPSULE BY MOUTH ON SUNDAYS 12 capsule 3   zolpidem (AMBIEN CR) 12.5 MG CR tablet TAKE ONE TABLET BY MOUTH AT BEDTIME 30 tablet 5   No current facility-administered medications on file prior to visit.   Past Medical History:  Diagnosis Date   Age-related osteoporosis without current pathological fracture    Arthritis    Barrett's esophagus without dysplasia    Cancer ([place])    denies   Chronic kidney disease    Closed fracture of maxillary sinus ([place]) 07/23/2021   Colitis 05/25/2020   Colitis 05/25/2020   GERD (gastroesophageal reflux disease)    Hearing loss  History of COVID-19    Hypercholesterolemia    Hypertension    Hypertensive chronic kidney disease with stage 1 through stage 4 chronic kidney disease, or unspecified chronic kidney disease    IBS (irritable bowel syndrome)    Impaired fasting glucose    Low back pain    Mixed hyperlipidemia    Paresthesia of skin    Primary insomnia    Sacroiliac inflammation ([place]) 03/02/2020   Scars    Trochanteric bursitis of left hip 09/29/2020   Vitamin D deficiency, unspecified    Past Surgical History:  Procedure Laterality Date   ANTERIOR CERVICAL DECOMP/DISCECTOMY FUSION N/A 03/06/2016   Procedure: ACDF - C5-C6 - C6-C7  ;  Surgeon: Earnie Larsson, MD;  Location: [facility];  Service: Neurosurgery;  Laterality: N/A;  ACDF -  C5-C6 - C6-C7     APPENDECTOMY     BUNIONECTOMY     right and left   CARDIAC CATHETERIZATION     had in [place] around 2012   CATARACT EXTRACTION W/ INTRAOCULAR LENS  IMPLANT, BILATERAL     CESAREAN SECTION     COLONOSCOPY W/ BIOPSIES AND POLYPECTOMY     MULTIPLE TOOTH EXTRACTIONS     TUBAL LIGATION      Family History  Problem Relation Age of Onset   Heart attack Mother    Heart attack Father    Hypertension Sister    Hypertension Brother    Renal Disease Brother    Heart attack Brother    Multiple myeloma Brother    Hypertension Sister    Hypertension Sister    Heart attack Sister    Social History   Socioeconomic History   Marital status: Married    Spouse name: Not on file   Number of children: 3   Years of education: Not on file   Highest education level: Not on file  Occupational History   Occupation: Retired Therapist, sports  Tobacco Use   Smoking status: Former    Packs/day: 0.50    Types: Cigarettes    Quit date: 10/02/2015    Years since quitting: 6.0   Smokeless tobacco: Never  Vaping Use   Vaping Use: Never used  Substance and Sexual Activity   Alcohol use: No   Drug use: No   Sexual activity: Not on file  Other Topics Concern   Not on file  Social History Narrative   Not on file   Social Determinants of Health   Financial Resource Strain: Not on file  Food Insecurity: No Food Insecurity   Worried About Running Out of Food in the Last Year: Never true   [place] in the Last Year: Never true  Transportation Needs: No Transportation Needs   Lack of Transportation (Medical): No   Lack of Transportation (Non-Medical): No  Physical Activity: Not on file  Stress: No Stress Concern Present   Feeling of Stress : Not at all  Social Connections: Not on file    Review of Systems  Constitutional:  Negative for chills, fatigue and fever.  HENT:  Negative for congestion, rhinorrhea and sore throat.   Respiratory:  Negative for cough and shortness of  breath.   Cardiovascular:  Negative for chest pain.  Gastrointestinal:  Positive for diarrhea. Negative for abdominal pain, constipation, nausea and vomiting.  Genitourinary:  Negative for dysuria and urgency.  Musculoskeletal:  Positive for back pain. Negative for myalgias.  Neurological:  Negative for dizziness, weakness, light-headedness and headaches.  Psychiatric/Behavioral:  Negative  for dysphoric mood. The patient is not nervous/anxious.     Objective:  BP 124/82   Pulse 72   Temp (!) 97.3 F (36.3 C)   Resp 14   Ht $R'5\' 3"'FS$  (1.6 m)   Wt 142 lb (64.4 kg)   BMI 25.15 kg/m   BP/Weight 10/17/2021 03/19/8501 05/19/4127  Systolic BP 786 767 209  Diastolic BP 82 80 76  Wt. (Lbs) 142 145.2 145.6  BMI 25.15 25.72 23.5    Physical Exam Vitals reviewed.  Constitutional:      Appearance: Normal appearance. She is normal weight.  Neck:     Vascular: No carotid bruit.  Cardiovascular:     Rate and Rhythm: Normal rate and regular rhythm.     Pulses: Normal pulses.     Heart sounds: Normal heart sounds.  Pulmonary:     Effort: Pulmonary effort is normal. No respiratory distress.     Breath sounds: Normal breath sounds.  Abdominal:     General: Abdomen is flat. Bowel sounds are normal.     Palpations: Abdomen is soft.     Tenderness: There is no abdominal tenderness.  Neurological:     Mental Status: She is alert and oriented to person, place, and time.  Psychiatric:        Mood and Affect: Mood normal.        Behavior: Behavior normal.    Diabetic Foot Exam - Simple   Simple Foot Form Diabetic Foot exam was performed with the following findings: Yes 10/17/2021  7:58 AM  Visual Inspection No deformities, no ulcerations, no other skin breakdown bilaterally: Yes Sensation Testing Intact to touch and monofilament testing bilaterally: Yes Pulse Check Posterior Tibialis and Dorsalis pulse intact bilaterally: Yes Comments      Lab Results  Component Value Date   WBC 8.5  07/13/2021   HGB 14.8 07/13/2021   HCT 44.2 07/13/2021   PLT 170 07/13/2021   GLUCOSE 118 (H) 07/21/2021   CHOL 124 07/13/2021   TRIG 136 07/13/2021   HDL 49 07/13/2021   LDLCALC 51 07/13/2021   ALT 30 07/21/2021   AST 34 07/21/2021   NA 143 07/21/2021   K 4.5 07/21/2021   CL 103 07/21/2021   CREATININE 1.36 (H) 07/21/2021   BUN 15 07/21/2021   CO2 21 07/21/2021   TSH 0.308 (L) 04/03/2021   HGBA1C 6.2 (H) 07/13/2021      Assessment & Plan:   Problem List Items Addressed This Visit       Cardiovascular and Mediastinum   Abdominal aortic atherosclerosis ([place]) - Primary    The current medical regimen is effective;  continue present plan and medications.       Relevant Orders   Lipid panel     Endocrine   Diabetes mellitus with stage 3 chronic kidney disease (HCC)    Control: good Recommend check sugars fasting daily. Recommend check feet daily. Recommend annual eye exams. Medicines: farxiga 5 mg once daily.  Continue to work on eating a healthy diet and exercise.  Labs drawn today.         Relevant Orders   Hemoglobin A1c     Musculoskeletal and Integument   Chronic idiopathic gout involving toe of right foot without tophus    The current medical regimen is effective;  continue present plan and medications.         Genitourinary   Stage 3b chronic kidney disease ([place])    stable      Hypertensive  kidney disease with stage 3b chronic kidney disease ([place])    Well controlled.  No changes to medicines.  Continue to work on eating a healthy diet and exercise.  Labs drawn today.        Relevant Orders   CBC with Differential/Platelet   Comprehensive metabolic panel   TSH     Other   Mixed hyperlipidemia    Well controlled.  No changes to medicines.  Continue to work on eating a healthy diet and exercise.  Labs drawn today.        Hypokalemia    Check potassium level      Vitamin D deficiency, unspecified    The current medical regimen  is effective;  continue present plan and medications.      .  No orders of the defined types were placed in this encounter.   Orders Placed This Encounter  Procedures   CBC with Differential/Platelet   Comprehensive metabolic panel   Hemoglobin A1c   Lipid panel   TSH      Follow-up: Return in about 4 months (around 02/15/2022) for chronic fasting.  An After Visit Summary was printed and given to the patient.  Rochel Brome, MD [facility] [phone]

## 2021-10-17 NOTE — Assessment & Plan Note (Signed)
The current medical regimen is effective;  continue present plan and medications.  

## 2021-10-17 NOTE — Assessment & Plan Note (Signed)
Check potassium level.

## 2021-10-17 NOTE — Assessment & Plan Note (Signed)
Well controlled.  ?No changes to medicines.  ?Continue to work on eating a healthy diet and exercise.  ?Labs drawn today.  ?

## 2021-10-17 NOTE — Assessment & Plan Note (Signed)
stable °

## 2021-10-17 NOTE — Assessment & Plan Note (Signed)
Control: good Recommend check sugars fasting daily. Recommend check feet daily. Recommend annual eye exams. Medicines: farxiga 5 mg once daily.  Continue to work on eating a healthy diet and exercise.  Labs drawn today.

## 2021-10-17 NOTE — Assessment & Plan Note (Signed)
>>  ASSESSMENT AND PLAN FOR HYPERTENSIVE KIDNEY DISEASE WITH STAGE 3B CHRONIC KIDNEY DISEASE (Pamela Schwartz) WRITTEN ON 10/17/2021  8:07 AM BY COX, KIRSTEN, MD  Well controlled.  No changes to medicines.  Continue to work on eating a healthy diet and exercise.  Labs drawn today.

## 2021-10-18 LAB — COMPREHENSIVE METABOLIC PANEL
ALT: 30 IU/L (ref 0–32)
AST: 31 IU/L (ref 0–40)
Albumin/Globulin Ratio: 1.7 (ref 1.2–2.2)
Albumin: 4.1 g/dL (ref 3.7–4.7)
Alkaline Phosphatase: 96 IU/L (ref 44–121)
BUN/Creatinine Ratio: 15 (ref 12–28)
BUN: 22 mg/dL (ref 8–27)
Bilirubin Total: 0.3 mg/dL (ref 0.0–1.2)
CO2: 26 mmol/L (ref 20–29)
Calcium: 9.7 mg/dL (ref 8.7–10.3)
Chloride: 101 mmol/L (ref 96–106)
Creatinine, Ser: 1.51 mg/dL — ABNORMAL HIGH (ref 0.57–1.00)
Globulin, Total: 2.4 g/dL (ref 1.5–4.5)
Glucose: 103 mg/dL — ABNORMAL HIGH (ref 70–99)
Potassium: 3.7 mmol/L (ref 3.5–5.2)
Sodium: 144 mmol/L (ref 134–144)
Total Protein: 6.5 g/dL (ref 6.0–8.5)
eGFR: 35 mL/min/{1.73_m2} — ABNORMAL LOW (ref >59)

## 2021-10-18 LAB — CBC WITH DIFFERENTIAL/PLATELET
Basophils Absolute: 0 10*3/uL (ref 0.0–0.2)
Basos: 0 %
EOS (ABSOLUTE): 0.4 10*3/uL (ref 0.0–0.4)
Eos: 4 %
Hematocrit: 43 % (ref 34.0–46.6)
Hemoglobin: 14 g/dL (ref 11.1–15.9)
Immature Grans (Abs): 0 10*3/uL (ref 0.0–0.1)
Immature Granulocytes: 0 %
Lymphocytes Absolute: 2.9 10*3/uL (ref 0.7–3.1)
Lymphs: 31 %
MCH: 29.6 pg (ref 26.6–33.0)
MCHC: 32.6 g/dL (ref 31.5–35.7)
MCV: 91 fL (ref 79–97)
Monocytes Absolute: 0.9 10*3/uL (ref 0.1–0.9)
Monocytes: 10 %
Neutrophils Absolute: 4.9 10*3/uL (ref 1.4–7.0)
Neutrophils: 55 %
Platelets: 196 10*3/uL (ref 150–450)
RBC: 4.73 x10E6/uL (ref 3.77–5.28)
RDW: 12.6 % (ref 11.7–15.4)
WBC: 9.1 10*3/uL (ref 3.4–10.8)

## 2021-10-18 LAB — LIPID PANEL
Chol/HDL Ratio: 2.4 ratio (ref 0.0–4.4)
Cholesterol, Total: 118 mg/dL (ref 100–199)
HDL: 49 mg/dL (ref >39)
LDL Chol Calc (NIH): 49 mg/dL (ref 0–99)
Triglycerides: 112 mg/dL (ref 0–149)
VLDL Cholesterol Cal: 20 mg/dL (ref 5–40)

## 2021-10-18 LAB — TSH: TSH: 0.248 u[IU]/mL — ABNORMAL LOW (ref 0.450–4.500)

## 2021-10-18 LAB — HEMOGLOBIN A1C
Est. average glucose Bld gHb Est-mCnc: 134 mg/dL
Hgb A1c MFr Bld: 6.3 % — ABNORMAL HIGH (ref 4.8–5.6)

## 2021-10-18 LAB — CARDIOVASCULAR RISK ASSESSMENT

## 2021-10-19 ENCOUNTER — Other Ambulatory Visit: Payer: Self-pay

## 2021-10-19 ENCOUNTER — Other Ambulatory Visit: Payer: Self-pay | Admitting: Family Medicine

## 2021-10-19 DIAGNOSIS — K219 Gastro-esophageal reflux disease without esophagitis: Secondary | ICD-10-CM

## 2021-10-19 MED ORDER — LISINOPRIL 20 MG PO TABS: 20.0000 mg | ORAL_TABLET | Freq: Every day | ORAL | 1 refills | Status: DC

## 2021-10-20 LAB — T4, FREE: Free T4: 1.25 ng/dL (ref 0.82–1.77)

## 2021-10-28 ENCOUNTER — Encounter: Payer: Self-pay | Admitting: Family Medicine

## 2021-11-01 ENCOUNTER — Other Ambulatory Visit: Payer: Self-pay | Admitting: Legal Medicine

## 2021-12-27 ENCOUNTER — Other Ambulatory Visit: Payer: Self-pay | Admitting: Family Medicine

## 2021-12-27 NOTE — Telephone Encounter (Signed)
Refill sent to pharmacy.   

## 2022-01-02 ENCOUNTER — Other Ambulatory Visit: Payer: Self-pay | Admitting: Legal Medicine

## 2022-01-02 DIAGNOSIS — M1A0710 Idiopathic chronic gout, right ankle and foot, without tophus (tophi): Secondary | ICD-10-CM

## 2022-01-02 NOTE — Telephone Encounter (Signed)
Refill sent to pharmacy.   

## 2022-01-09 ENCOUNTER — Other Ambulatory Visit: Payer: Self-pay | Admitting: Family Medicine

## 2022-01-09 DIAGNOSIS — I129 Hypertensive chronic kidney disease with stage 1 through stage 4 chronic kidney disease, or unspecified chronic kidney disease: Secondary | ICD-10-CM

## 2022-01-09 DIAGNOSIS — N183 Chronic kidney disease, stage 3 unspecified: Secondary | ICD-10-CM

## 2022-01-09 DIAGNOSIS — K219 Gastro-esophageal reflux disease without esophagitis: Secondary | ICD-10-CM

## 2022-01-22 ENCOUNTER — Other Ambulatory Visit: Payer: Self-pay | Admitting: Family Medicine

## 2022-01-23 ENCOUNTER — Other Ambulatory Visit: Payer: Self-pay | Admitting: Family Medicine

## 2022-01-24 ENCOUNTER — Other Ambulatory Visit: Payer: Self-pay | Admitting: Family Medicine

## 2022-01-24 NOTE — Telephone Encounter (Signed)
prescription for ?s ?

## 2022-01-31 ENCOUNTER — Other Ambulatory Visit: Payer: Self-pay | Admitting: Family Medicine

## 2022-01-31 NOTE — Telephone Encounter (Signed)
Refill sent to pharmacy.   

## 2022-02-05 ENCOUNTER — Other Ambulatory Visit: Payer: Self-pay | Admitting: Family Medicine

## 2022-02-05 NOTE — Telephone Encounter (Signed)
Refill sent to pharmacy.   

## 2022-02-15 ENCOUNTER — Encounter: Payer: Self-pay | Admitting: Family Medicine

## 2022-02-15 ENCOUNTER — Ambulatory Visit (INDEPENDENT_AMBULATORY_CARE_PROVIDER_SITE_OTHER): Payer: Medicare Other | Admitting: Family Medicine

## 2022-02-15 DIAGNOSIS — J181 Lobar pneumonia, unspecified organism: Secondary | ICD-10-CM | POA: Diagnosis not present

## 2022-02-15 DIAGNOSIS — J209 Acute bronchitis, unspecified: Secondary | ICD-10-CM | POA: Insufficient documentation

## 2022-02-15 MED ORDER — CEFTRIAXONE SODIUM 1 G IJ SOLR
1.0000 g | Freq: Once | INTRAMUSCULAR | Status: AC
  Administered 2022-02-15: 1 g via INTRAMUSCULAR

## 2022-02-15 MED ORDER — AZITHROMYCIN 250 MG PO TABS: ORAL_TABLET | ORAL | 0 refills | Status: DC

## 2022-02-15 MED ORDER — BENZONATATE 200 MG PO CAPS: 200.0000 mg | ORAL_CAPSULE | Freq: Three times a day (TID) | ORAL | 0 refills | Status: DC | PRN

## 2022-02-15 MED ORDER — TRIAMCINOLONE ACETONIDE 40 MG/ML IJ SUSP
80.0000 mg | Freq: Once | INTRAMUSCULAR | Status: AC
  Administered 2022-02-15: 80 mg via INTRAMUSCULAR

## 2022-02-15 MED ORDER — CEFDINIR 300 MG PO CAPS: 300.0000 mg | ORAL_CAPSULE | Freq: Two times a day (BID) | ORAL | 0 refills | Status: DC

## 2022-02-15 NOTE — Assessment & Plan Note (Signed)
Order cxr. ?Kenalog shot given.  ?

## 2022-02-15 NOTE — Assessment & Plan Note (Signed)
Recommend chest xray.  ?zpack and cefdinir ?Tessalon perles sent.  ?Rocephin shot given.  ? ?

## 2022-02-15 NOTE — Progress Notes (Signed)
? ?Acute Office Visit ? ?Subjective:  ? ? Patient ID: Pamela Schwartz, female    DOB: 1940-11-29, 81 y.o.   MRN: [id number] ? ?Chief Complaint  ?Patient presents with  ? Cough  ? ? ?HPI: ?Patient is in today for cough, congestion x 2 weeks. Sore throat, nasal congestion and ear pressure improved, but cough keeps on. Denies chest pain. Patient has shortness of breath due to coughing episodes.  Fever Saturday 100. Covid 19 negative x 2 at home. NO gi symptoms.  ? ?Past Medical History:  ?Diagnosis Date  ? Age-related osteoporosis without current pathological fracture   ? Arthritis   ? Barrett's esophagus without dysplasia   ? Cancer [facility])   ? denies  ? Chronic kidney disease   ? Closed fracture of maxillary sinus ([place]) 07/23/2021  ? Colitis 05/25/2020  ? Colitis 05/25/2020  ? GERD (gastroesophageal reflux disease)   ? Hearing loss   ? History of COVID-19   ? Hypercholesterolemia   ? Hypertension   ? Hypertensive chronic kidney disease with stage 1 through stage 4 chronic kidney disease, or unspecified chronic kidney disease   ? IBS (irritable bowel syndrome)   ? Impaired fasting glucose   ? Low back pain   ? Mixed hyperlipidemia   ? Paresthesia of skin   ? Primary insomnia   ? Sacroiliac inflammation ([place]) 03/02/2020  ? Scars   ? Trochanteric bursitis of left hip 09/29/2020  ? Vitamin D deficiency, unspecified   ? ? ?Past Surgical History:  ?Procedure Laterality Date  ? ANTERIOR CERVICAL DECOMP/DISCECTOMY FUSION N/A 03/06/2016  ? Procedure: ACDF - C5-C6 - C6-C7  ;  Surgeon: Earnie Larsson, MD;  Location: [facility];  Service: Neurosurgery;  Laterality: N/A;  ACDF - C5-C6 - C6-C7    ? APPENDECTOMY    ? BUNIONECTOMY    ? right and left  ? CARDIAC CATHETERIZATION    ? had in [place] around 2012  ? CATARACT EXTRACTION W/ INTRAOCULAR LENS  IMPLANT, BILATERAL    ? CESAREAN SECTION    ? COLONOSCOPY W/ BIOPSIES AND POLYPECTOMY    ? MULTIPLE TOOTH EXTRACTIONS    ? TUBAL LIGATION    ? ? ?Family History  ?Problem Relation Age of Onset   ? Heart attack Mother   ? Heart attack Father   ? Hypertension Sister   ? Hypertension Brother   ? Renal Disease Brother   ? Heart attack Brother   ? Multiple myeloma Brother   ? Hypertension Sister   ? Hypertension Sister   ? Heart attack Sister   ? ? ?Social History  ? ?Socioeconomic History  ? Marital status: Married  ?  Spouse name: Not on file  ? Number of children: 3  ? Years of education: Not on file  ? Highest education level: Not on file  ?Occupational History  ? Occupation: Retired Therapist, sports  ?Tobacco Use  ? Smoking status: Former  ?  Packs/day: 0.50  ?  Types: Cigarettes  ?  Quit date: 10/02/2015  ?  Years since quitting: 6.3  ? Smokeless tobacco: Never  ?Vaping Use  ? Vaping Use: Never used  ?Substance and Sexual Activity  ? Alcohol use: No  ? Drug use: No  ? Sexual activity: Not on file  ?Other Topics Concern  ? Not on file  ?Social History Narrative  ? Not on file  ? ?Social Determinants of Health  ? ?Financial Resource Strain: Not on file  ?Food Insecurity: Not on file  ?Transportation  Needs: Not on file  ?Physical Activity: Not on file  ?Stress: Not on file  ?Social Connections: Not on file  ?Intimate Partner Violence: Not on file  ? ? ?Outpatient Medications Prior to Visit  ?Medication Sig Dispense Refill  ? allopurinol (ZYLOPRIM) 100 MG tablet TAKE ONE TABLET BY MOUTH EVERY EVENING 90 tablet 1  ? amLODipine (NORVASC) 10 MG tablet TAKE ONE TABLET BY MOUTH EVERY EVENING 90 tablet 3  ? aspirin 81 MG tablet Take 81 mg by mouth daily.    ? chlorthalidone (HYGROTON) 25 MG tablet TAKE ONE TABLET BY MOUTH EVERY DAY 90 tablet 1  ? colchicine 0.6 MG tablet Take 1 TABLET by mouth in the morning and at bedtime. X 1 week for gout flare. 14 tablet 2  ? FARXIGA 5 MG TABS tablet TAKE ONE TABLET BY MOUTH EVERY DAY 90 tablet 1  ? lisinopril (ZESTRIL) 20 MG tablet Take 1 tablet (20 mg total) by mouth daily. 90 tablet 1  ? metoprolol succinate (TOPROL-XL) 100 MG 24 hr tablet TAKE 3 TABLETS BY MOUTH EVERY DAY 270 tablet 3  ?  Multiple Vitamins-Minerals (PRESERVISION AREDS 2 PO) Take by mouth.    ? pantoprazole (PROTONIX) 40 MG tablet TAKE ONE TABLET BY MOUTH EVERY EVENING 90 tablet 3  ? promethazine (PHENERGAN) 25 MG tablet take 1 tablet (25 mg) by oral route 4 times per day AS NEEDED nausea/vomiting] 60 tablet 3  ? rosuvastatin (CRESTOR) 40 MG tablet TAKE ONE TABLET BY MOUTH EVERY EVENING 90 tablet 1  ? traMADol (ULTRAM) 50 MG tablet TAKE ONE TABLET BY MOUTH EVERY 8 HOURS AS NEEDED FOR BACK PAIN 90 tablet 2  ? VASCEPA 1 g capsule TAKE 2 CAPSULES BY MOUTH TWICE DAILY 360 capsule 1  ? Vitamin D, Ergocalciferol, (DRISDOL) 1.25 MG (50000 UNIT) CAPS capsule TAKE ONE CAPSULE BY MOUTH ON SUNDAYS 12 capsule 3  ? zolpidem (AMBIEN CR) 12.5 MG CR tablet TAKE ONE TABLET BY MOUTH AT BEDTIME 30 tablet 5  ? potassium chloride SA (KLOR-CON M) 20 MEQ tablet TAKE ONE TABLET BY MOUTH TWICE DAILY 180 tablet 1  ? ?No facility-administered medications prior to visit.  ? ? ?Allergies  ?Allergen Reactions  ? Clonidine Derivatives Other (See Comments)  ? Dexilant [Dexlansoprazole] Diarrhea  ? Hydralazine   ? Nickel   ? Tape Other (See Comments)  ?  adhesive ?adhesive  ? Tizanidine Hcl Other (See Comments)  ?  Somnolence  ? Zantac [Ranitidine Hcl]   ? Chantix [Varenicline] Rash  ? ? ?Review of Systems  ?Constitutional:  Positive for fever (when symptoms started, not current). Negative for chills and fatigue.  ?HENT:  Negative for congestion, ear pain, rhinorrhea and sore throat.   ?Respiratory:  Positive for cough (x1 week), chest tightness (pressure) and shortness of breath.   ?Gastrointestinal:  Negative for constipation, diarrhea, nausea and vomiting.  ?Genitourinary:  Negative for dysuria and urgency.  ?Musculoskeletal:  Negative for arthralgias, back pain and myalgias.  ?Skin:  Negative for rash.  ?Neurological:  Negative for weakness and headaches.  ?Psychiatric/Behavioral:  Negative for dysphoric mood. The patient is not nervous/anxious.   ? ?    ?Objective:  ?  ?Physical Exam ?Constitutional:   ?   Appearance: Normal appearance. She is normal weight.  ?HENT:  ?   Right Ear: Tympanic membrane normal.  ?   Left Ear: Tympanic membrane normal.  ?   Nose: Congestion (erythema and edematous nasal turbinates.) present.  ?Cardiovascular:  ?   Rate and  Rhythm: Normal rate and regular rhythm.  ?   Heart sounds: Normal heart sounds.  ?Pulmonary:  ?   Effort: Pulmonary effort is normal.  ?   Breath sounds: Rales present.  ?Abdominal:  ?   General: Bowel sounds are normal.  ?Neurological:  ?   Mental Status: She is oriented to person, place, and time. Mental status is at baseline.  ?Psychiatric:     ?   Mood and Affect: Mood normal.     ?   Behavior: Behavior normal.  ? ? ?BP 104/62   Pulse 79   Temp 98.2 ?F (36.8 ?C)   Resp 20   Ht $R'5\' 3"'cU$  (1.6 m)   Wt 146 lb 3.2 oz (66.3 kg)   SpO2 96%   BMI 25.90 kg/m?  ?Wt Readings from Last 3 Encounters:  ?02/15/22 146 lb 3.2 oz (66.3 kg)  ?10/17/21 142 lb (64.4 kg)  ?07/21/21 145 lb 3.2 oz (65.9 kg)  ? ? ?Health Maintenance Due  ?Topic Date Due  ? OPHTHALMOLOGY EXAM  Never done  ? Hepatitis C Screening  Never done  ? MAMMOGRAM  10/30/2019  ? COVID-19 Vaccine (4 - Booster for Moderna series) 05/02/2021  ? ? ?There are no preventive care reminders to display for this patient. ? ? ?Lab Results  ?Component Value Date  ? TSH 0.248 (L) 10/17/2021  ? ?Lab Results  ?Component Value Date  ? WBC 9.1 10/17/2021  ? HGB 14.0 10/17/2021  ? HCT 43.0 10/17/2021  ? MCV 91 10/17/2021  ? PLT 196 10/17/2021  ? ?Lab Results  ?Component Value Date  ? NA 144 10/17/2021  ? K 3.7 10/17/2021  ? CO2 26 10/17/2021  ? GLUCOSE 103 (H) 10/17/2021  ? BUN 22 10/17/2021  ? CREATININE 1.51 (H) 10/17/2021  ? BILITOT 0.3 10/17/2021  ? ALKPHOS 96 10/17/2021  ? AST 31 10/17/2021  ? ALT 30 10/17/2021  ? PROT 6.5 10/17/2021  ? ALBUMIN 4.1 10/17/2021  ? CALCIUM 9.7 10/17/2021  ? ANIONGAP 12 03/06/2016  ? EGFR 35 (L) 10/17/2021  ? ?Lab Results  ?Component Value Date   ? CHOL 118 10/17/2021  ? ?Lab Results  ?Component Value Date  ? HDL 49 10/17/2021  ? ?Lab Results  ?Component Value Date  ? LDLCALC 49 10/17/2021  ? ?Lab Results  ?Component Value Date  ? TRIG 112 10/17/2021  ? ?Lab

## 2022-02-16 ENCOUNTER — Other Ambulatory Visit: Payer: Self-pay

## 2022-02-16 DIAGNOSIS — J209 Acute bronchitis, unspecified: Secondary | ICD-10-CM

## 2022-02-20 ENCOUNTER — Ambulatory Visit: Payer: Medicare Other | Admitting: Family Medicine

## 2022-02-25 ENCOUNTER — Other Ambulatory Visit: Payer: Self-pay | Admitting: Family Medicine

## 2022-02-26 NOTE — Telephone Encounter (Signed)
Refill sent to pharmacy.   

## 2022-02-27 ENCOUNTER — Telehealth: Payer: Self-pay

## 2022-02-27 ENCOUNTER — Encounter: Payer: Self-pay | Admitting: Family Medicine

## 2022-02-27 ENCOUNTER — Ambulatory Visit (INDEPENDENT_AMBULATORY_CARE_PROVIDER_SITE_OTHER): Payer: Medicare Other | Admitting: Family Medicine

## 2022-02-27 VITALS — BP 120/78 | HR 72 | Temp 96.6°F | Resp 16 | Ht 63.0 in | Wt 143.0 lb

## 2022-02-27 DIAGNOSIS — N1832 Chronic kidney disease, stage 3b: Secondary | ICD-10-CM | POA: Diagnosis not present

## 2022-02-27 DIAGNOSIS — I700 Atherosclerosis of aorta: Secondary | ICD-10-CM | POA: Diagnosis not present

## 2022-02-27 DIAGNOSIS — I129 Hypertensive chronic kidney disease with stage 1 through stage 4 chronic kidney disease, or unspecified chronic kidney disease: Secondary | ICD-10-CM | POA: Diagnosis not present

## 2022-02-27 DIAGNOSIS — E1122 Type 2 diabetes mellitus with diabetic chronic kidney disease: Secondary | ICD-10-CM

## 2022-02-27 DIAGNOSIS — E559 Vitamin D deficiency, unspecified: Secondary | ICD-10-CM | POA: Diagnosis not present

## 2022-02-27 DIAGNOSIS — E782 Mixed hyperlipidemia: Secondary | ICD-10-CM | POA: Diagnosis not present

## 2022-02-27 DIAGNOSIS — R946 Abnormal results of thyroid function studies: Secondary | ICD-10-CM

## 2022-02-27 DIAGNOSIS — G894 Chronic pain syndrome: Secondary | ICD-10-CM | POA: Diagnosis not present

## 2022-02-27 DIAGNOSIS — N183 Chronic kidney disease, stage 3 unspecified: Secondary | ICD-10-CM | POA: Diagnosis not present

## 2022-02-27 DIAGNOSIS — M1A0710 Idiopathic chronic gout, right ankle and foot, without tophus (tophi): Secondary | ICD-10-CM

## 2022-02-27 MED ORDER — LISINOPRIL 20 MG PO TABS
20.0000 mg | ORAL_TABLET | Freq: Every day | ORAL | 1 refills | Status: DC
Start: 2022-02-27 — End: 2022-07-25

## 2022-02-27 MED ORDER — ZOLPIDEM TARTRATE ER 12.5 MG PO TBCR: 12.5000 mg | EXTENDED_RELEASE_TABLET | Freq: Every day | ORAL | 1 refills | Status: DC

## 2022-02-27 NOTE — Progress Notes (Signed)
? ?Subjective:  ?Patient ID: Pamela Schwartz, female    DOB: [birth year]  Age: 81 y.o. MRN: [id number] ? ?Chief Complaint  ?Patient presents with  ? Hypertension  ? ?HPI ?Diabetes:  ?Complications: CKD ?Most recent A1C:6.3 ?Current medications: farxiga 5 mg qd ?Last Eye Exam: overdue.  ?Foot checks: checks daiily. ? ?Hyperlipidemia: ?Current medications: crestor 40 mg before bed, vascepa 1 gm 2 capsules twice daily.   ? ?Hypertension: ?Complications: CKD ?Current medications: toprol xl 100 mg 3 tablets oral daily, zestril 20 mg daily, amlodipine 10 mg daily, chlorthalidone 25 mg daily, and on aspirin 81 mg daily.  ? ?Insomnia: on zolpidem cr 12.5 mg once before bed.  ? ?GERD: protonix 40 mg daily.  ?Gout: on allopurinol 100 mg daily and colchicine 0.6 mg course as needed for gout flare up.  ? ?Diet: Health diet. Exercise: yes ? ?Current Outpatient Medications on File Prior to Visit  ?Medication Sig Dispense Refill  ? allopurinol (ZYLOPRIM) 100 MG tablet TAKE ONE TABLET BY MOUTH EVERY EVENING 90 tablet 1  ? amLODipine (NORVASC) 10 MG tablet TAKE ONE TABLET BY MOUTH EVERY EVENING 90 tablet 3  ? aspirin 81 MG tablet Take 81 mg by mouth daily.    ? chlorthalidone (HYGROTON) 25 MG tablet TAKE ONE TABLET BY MOUTH EVERY DAY 90 tablet 1  ? colchicine 0.6 MG tablet Take 1 TABLET by mouth in the morning and at bedtime. X 1 week for gout flare. 14 tablet 2  ? FARXIGA 5 MG TABS tablet TAKE ONE TABLET BY MOUTH EVERY DAY 90 tablet 1  ? metoprolol succinate (TOPROL-XL) 100 MG 24 hr tablet TAKE 3 TABLETS BY MOUTH EVERY DAY 270 tablet 3  ? Multiple Vitamins-Minerals (PRESERVISION AREDS 2 PO) Take by mouth.    ? pantoprazole (PROTONIX) 40 MG tablet TAKE ONE TABLET BY MOUTH EVERY EVENING 90 tablet 3  ? promethazine (PHENERGAN) 25 MG tablet take 1 tablet (25 mg) by oral route 4 times per day AS NEEDED nausea/vomiting] 60 tablet 3  ? rosuvastatin (CRESTOR) 40 MG tablet TAKE ONE TABLET BY MOUTH EVERY EVENING 90 tablet 1  ? traMADol (ULTRAM)  50 MG tablet TAKE ONE TABLET BY MOUTH EVERY 8 HOURS AS NEEDED FOR BACK PAIN 90 tablet 2  ? VASCEPA 1 g capsule TAKE 2 CAPSULES BY MOUTH TWICE DAILY 360 capsule 1  ? Vitamin D, Ergocalciferol, (DRISDOL) 1.25 MG (50000 UNIT) CAPS capsule TAKE ONE CAPSULE BY MOUTH ON SUNDAYS 12 capsule 3  ? ?No current facility-administered medications on file prior to visit.  ? ?Past Medical History:  ?Diagnosis Date  ? Age-related osteoporosis without current pathological fracture   ? Arthritis   ? Barrett's esophagus without dysplasia   ? Cancer [facility])   ? denies  ? Chronic kidney disease   ? Closed fracture of maxillary sinus ([place]) 07/23/2021  ? Colitis 05/25/2020  ? Colitis 05/25/2020  ? GERD (gastroesophageal reflux disease)   ? Hearing loss   ? History of COVID-19   ? Hypercholesterolemia   ? Hypertension   ? Hypertensive chronic kidney disease with stage 1 through stage 4 chronic kidney disease, or unspecified chronic kidney disease   ? IBS (irritable bowel syndrome)   ? Impaired fasting glucose   ? Low back pain   ? Mixed hyperlipidemia   ? Paresthesia of skin   ? Primary insomnia   ? Sacroiliac inflammation ([place]) 03/02/2020  ? Scars   ? Trochanteric bursitis of left hip 09/29/2020  ? Vitamin D deficiency, unspecified   ? ?  Past Surgical History:  ?Procedure Laterality Date  ? ANTERIOR CERVICAL DECOMP/DISCECTOMY FUSION N/A 03/06/2016  ? Procedure: ACDF - C5-C6 - C6-C7  ;  Surgeon: Earnie Larsson, MD;  Location: [place] [facility];  Service: Neurosurgery;  Laterality: N/A;  ACDF - C5-C6 - C6-C7    ? APPENDECTOMY    ? BUNIONECTOMY    ? right and left  ? CARDIAC CATHETERIZATION    ? had in [place] around 2012  ? CATARACT EXTRACTION W/ INTRAOCULAR LENS  IMPLANT, BILATERAL    ? CESAREAN SECTION    ? COLONOSCOPY W/ BIOPSIES AND POLYPECTOMY    ? MULTIPLE TOOTH EXTRACTIONS    ? TUBAL LIGATION    ?  ?Family History  ?Problem Relation Age of Onset  ? Heart attack Mother   ? Heart attack Father   ? Hypertension Sister   ? Hypertension Brother   ? Renal  Disease Brother   ? Heart attack Brother   ? Multiple myeloma Brother   ? Hypertension Sister   ? Hypertension Sister   ? Heart attack Sister   ? ?Social History  ? ?Socioeconomic History  ? Marital status: Married  ?  Spouse name: Not on file  ? Number of children: 3  ? Years of education: Not on file  ? Highest education level: Not on file  ?Occupational History  ? Occupation: Retired Therapist, sports  ?Tobacco Use  ? Smoking status: Former  ?  Packs/day: 0.50  ?  Types: Cigarettes  ?  Quit date: 10/02/2015  ?  Years since quitting: 6.4  ? Smokeless tobacco: Never  ?Vaping Use  ? Vaping Use: Never used  ?Substance and Sexual Activity  ? Alcohol use: No  ? Drug use: No  ? Sexual activity: Not on file  ?Other Topics Concern  ? Not on file  ?Social History Narrative  ? Not on file  ? ?Social Determinants of Health  ? ?Financial Resource Strain: Not on file  ?Food Insecurity: Not on file  ?Transportation Needs: Not on file  ?Physical Activity: Not on file  ?Stress: Not on file  ?Social Connections: Not on file  ? ? ?Review of Systems  ?Constitutional:  Negative for chills, fatigue and fever.  ?HENT:  Positive for mouth sores. Negative for congestion, rhinorrhea and sore throat.   ?Respiratory:  Negative for cough and shortness of breath.   ?Cardiovascular:  Negative for chest pain.  ?Gastrointestinal:  Positive for diarrhea. Negative for abdominal pain, constipation, nausea and vomiting.  ?Genitourinary:  Negative for dysuria and urgency.  ?Musculoskeletal:  Positive for arthralgias (thumb pain) and back pain. Negative for myalgias.  ?Neurological:  Negative for dizziness, weakness, light-headedness and headaches.  ?Psychiatric/Behavioral:  Negative for dysphoric mood. The patient is not nervous/anxious.   ? ? ?Objective:  ?BP 120/78   Pulse 72   Temp (!) 96.6 ?F (35.9 ?C)   Resp 16   Ht $R'5\' 3"'mN$  (1.6 m)   Wt 143 lb (64.9 kg)   BMI 25.33 kg/m?  ? ? ?  02/27/2022  ?  8:02 AM 02/15/2022  ? 11:37 AM 10/17/2021  ?  8:05 AM  ?BP/Weight   ?Systolic BP 622 633 354  ?Diastolic BP 78 62 82  ?Wt. (Lbs) 143 146.2   ?BMI 25.33 kg/m2 25.9 kg/m2   ? ? ?Physical Exam ?Vitals reviewed.  ?Constitutional:   ?   Appearance: Normal appearance. She is normal weight.  ?HENT:  ?   Mouth/Throat:  ?   Tongue: No lesions.  ?Neck:  ?  Vascular: No carotid bruit.  ?Cardiovascular:  ?   Rate and Rhythm: Normal rate and regular rhythm.  ?   Heart sounds: Normal heart sounds.  ?Pulmonary:  ?   Effort: Pulmonary effort is normal. No respiratory distress.  ?   Breath sounds: Normal breath sounds.  ?Abdominal:  ?   General: Abdomen is flat. Bowel sounds are normal.  ?   Palpations: Abdomen is soft.  ?   Tenderness: There is no abdominal tenderness.  ?Neurological:  ?   Mental Status: She is alert and oriented to person, place, and time.  ?Psychiatric:     ?   Mood and Affect: Mood normal.     ?   Behavior: Behavior normal.  ? ? ?Diabetic Foot Exam - Simple   ?Simple Foot Form ? 02/27/2022  4:23 PM  ?Visual Inspection ?No deformities, no ulcerations, no other skin breakdown bilaterally: Yes ?Sensation Testing ?Intact to touch and monofilament testing bilaterally: Yes ?Pulse Check ?Posterior Tibialis and Dorsalis pulse intact bilaterally: Yes ?Comments ?  ?  ? ?Lab Results  ?Component Value Date  ? WBC 11.6 (H) 02/27/2022  ? HGB 14.3 02/27/2022  ? HCT 43.4 02/27/2022  ? PLT 200 02/27/2022  ? GLUCOSE 96 02/27/2022  ? CHOL 126 02/27/2022  ? TRIG 100 02/27/2022  ? HDL 61 02/27/2022  ? LDLCALC 47 02/27/2022  ? ALT 68 (H) 02/27/2022  ? AST 51 (H) 02/27/2022  ? NA 141 02/27/2022  ? K 5.1 02/27/2022  ? CL 104 02/27/2022  ? CREATININE 1.52 (H) 02/27/2022  ? BUN 24 02/27/2022  ? CO2 23 02/27/2022  ? TSH 0.112 (L) 02/27/2022  ? HGBA1C 6.0 (H) 02/27/2022  ? MICROALBUR 150 10/17/2021  ? ? ? ? ?Assessment & Plan:  ? ?Problem List Items Addressed This Visit   ? ?  ? Cardiovascular and Mediastinum  ? Abdominal aortic atherosclerosis ([place])  ?  The current medical regimen is effective;  continue  present plan and medications. ?Continue crestor and aspirin.  ? ?  ?  ? Relevant Medications  ? lisinopril (ZESTRIL) 20 MG tablet  ?  ? Endocrine  ? Diabetes mellitus with stage 3 chronic kidney disease (H

## 2022-02-27 NOTE — Telephone Encounter (Signed)
Patient called and stated she seen you this morning and you was going to send her something in for her mouth. She wasn't sure of the medication and pharmacy said they didn't have anything. Please advise. ?

## 2022-03-01 LAB — CBC WITH DIFFERENTIAL/PLATELET
Basophils Absolute: 0.1 10*3/uL (ref 0.0–0.2)
Basos: 0 %
EOS (ABSOLUTE): 0.3 10*3/uL (ref 0.0–0.4)
Eos: 3 %
Hematocrit: 43.4 % (ref 34.0–46.6)
Hemoglobin: 14.3 g/dL (ref 11.1–15.9)
Immature Grans (Abs): 0 10*3/uL (ref 0.0–0.1)
Immature Granulocytes: 0 %
Lymphocytes Absolute: 3.4 10*3/uL — ABNORMAL HIGH (ref 0.7–3.1)
Lymphs: 29 %
MCH: 29.5 pg (ref 26.6–33.0)
MCHC: 32.9 g/dL (ref 31.5–35.7)
MCV: 90 fL (ref 79–97)
Monocytes Absolute: 0.9 10*3/uL (ref 0.1–0.9)
Monocytes: 7 %
Neutrophils Absolute: 7 10*3/uL (ref 1.4–7.0)
Neutrophils: 61 %
Platelets: 200 10*3/uL (ref 150–450)
RBC: 4.85 x10E6/uL (ref 3.77–5.28)
RDW: 13.7 % (ref 11.7–15.4)
WBC: 11.6 10*3/uL — ABNORMAL HIGH (ref 3.4–10.8)

## 2022-03-01 LAB — LIPID PANEL
Chol/HDL Ratio: 2.1 ratio (ref 0.0–4.4)
Cholesterol, Total: 126 mg/dL (ref 100–199)
HDL: 61 mg/dL (ref >39)
LDL Chol Calc (NIH): 47 mg/dL (ref 0–99)
Triglycerides: 100 mg/dL (ref 0–149)
VLDL Cholesterol Cal: 18 mg/dL (ref 5–40)

## 2022-03-01 LAB — T4, FREE: Free T4: 1.44 ng/dL (ref 0.82–1.77)

## 2022-03-01 LAB — COMPREHENSIVE METABOLIC PANEL
ALT: 68 IU/L — ABNORMAL HIGH (ref 0–32)
AST: 51 IU/L — ABNORMAL HIGH (ref 0–40)
Albumin/Globulin Ratio: 1.8 (ref 1.2–2.2)
Albumin: 4.6 g/dL (ref 3.7–4.7)
Alkaline Phosphatase: 95 IU/L (ref 44–121)
BUN/Creatinine Ratio: 16 (ref 12–28)
BUN: 24 mg/dL (ref 8–27)
Bilirubin Total: 0.5 mg/dL (ref 0.0–1.2)
CO2: 23 mmol/L (ref 20–29)
Calcium: 10.2 mg/dL (ref 8.7–10.3)
Chloride: 104 mmol/L (ref 96–106)
Creatinine, Ser: 1.52 mg/dL — ABNORMAL HIGH (ref 0.57–1.00)
Globulin, Total: 2.5 g/dL (ref 1.5–4.5)
Glucose: 96 mg/dL (ref 70–99)
Potassium: 5.1 mmol/L (ref 3.5–5.2)
Sodium: 141 mmol/L (ref 134–144)
Total Protein: 7.1 g/dL (ref 6.0–8.5)
eGFR: 35 mL/min/{1.73_m2} — ABNORMAL LOW (ref >59)

## 2022-03-01 LAB — MICROALBUMIN / CREATININE URINE RATIO
Creatinine, Urine: 34 mg/dL
Microalb/Creat Ratio: 169 mg/g creat — ABNORMAL HIGH (ref 0–29)
Microalbumin, Urine: 57.5 ug/mL

## 2022-03-01 LAB — TSH: TSH: 0.112 u[IU]/mL — ABNORMAL LOW (ref 0.450–4.500)

## 2022-03-01 LAB — VITAMIN D 25 HYDROXY (VIT D DEFICIENCY, FRACTURES): Vit D, 25-Hydroxy: 112 ng/mL — ABNORMAL HIGH (ref 30.0–100.0)

## 2022-03-01 LAB — HEMOGLOBIN A1C
Est. average glucose Bld gHb Est-mCnc: 126 mg/dL
Hgb A1c MFr Bld: 6 % — ABNORMAL HIGH (ref 4.8–5.6)

## 2022-03-06 ENCOUNTER — Other Ambulatory Visit: Payer: Self-pay

## 2022-03-06 ENCOUNTER — Ambulatory Visit: Payer: Medicare Other

## 2022-03-06 DIAGNOSIS — R7989 Other specified abnormal findings of blood chemistry: Secondary | ICD-10-CM

## 2022-03-11 DIAGNOSIS — R946 Abnormal results of thyroid function studies: Secondary | ICD-10-CM | POA: Insufficient documentation

## 2022-03-11 DIAGNOSIS — G894 Chronic pain syndrome: Secondary | ICD-10-CM

## 2022-03-11 HISTORY — DX: Chronic pain syndrome: G89.4

## 2022-03-11 NOTE — Assessment & Plan Note (Signed)
Check tsh, free T4 

## 2022-03-11 NOTE — Assessment & Plan Note (Signed)
Secondary to chronic back pain.  Continue tramadol. 

## 2022-03-11 NOTE — Assessment & Plan Note (Signed)
Stable

## 2022-03-11 NOTE — Assessment & Plan Note (Signed)
The current medical regimen is effective; continue present plan and medications. Taking allopurinol 100 mg daily. 

## 2022-03-11 NOTE — Assessment & Plan Note (Signed)
>>  ASSESSMENT AND PLAN FOR HYPERTENSIVE KIDNEY DISEASE WITH STAGE 3B CHRONIC KIDNEY DISEASE ([place]) WRITTEN ON 03/11/2022  4:27 PM BY COX, KIRSTEN, MD  Well controlled.  No changes to medicines. Continue  toprol xl 100 mg 3 tablets oral daily, zestril 20 mg daily, amlodipine 10 mg daily, chlorthalidone 25 mg daily, and on aspirin 81 mg daily.  Continue to work on eating a healthy diet and exercise.  Labs drawn today.

## 2022-03-11 NOTE — Assessment & Plan Note (Signed)
Control: good ?Recommend check feet daily. ?Recommend annual eye exams. ?Medicines: continue farxiga 5 mg daily ?Continue to work on eating a healthy diet and exercise.  ?Labs drawn today.   ? ?

## 2022-03-11 NOTE — Assessment & Plan Note (Signed)
The current medical regimen is effective;  continue present plan and medications. ?Continue crestor.  ?Continue vascepa.  ?Recommend continue to work on eating healthy diet and exercise. ?Check labs.  ?

## 2022-03-11 NOTE — Assessment & Plan Note (Signed)
The current medical regimen is effective;  continue present plan and medications. Continue crestor and aspirin.  

## 2022-03-11 NOTE — Assessment & Plan Note (Signed)
Check vitamin D level 

## 2022-03-11 NOTE — Assessment & Plan Note (Signed)
Well controlled.  ?No changes to medicines. Continue  toprol xl 100 mg 3 tablets oral daily, zestril 20 mg daily, amlodipine 10 mg daily, chlorthalidone 25 mg daily, and on aspirin 81 mg daily.  ?Continue to work on eating a healthy diet and exercise.  ?Labs drawn today.  ? ?

## 2022-03-13 ENCOUNTER — Ambulatory Visit: Payer: Medicare Other

## 2022-03-13 ENCOUNTER — Other Ambulatory Visit: Payer: Self-pay | Admitting: Family Medicine

## 2022-03-13 DIAGNOSIS — R7989 Other specified abnormal findings of blood chemistry: Secondary | ICD-10-CM

## 2022-03-17 ENCOUNTER — Other Ambulatory Visit: Payer: Self-pay | Admitting: Family Medicine

## 2022-03-21 ENCOUNTER — Ambulatory Visit (INDEPENDENT_AMBULATORY_CARE_PROVIDER_SITE_OTHER): Payer: Medicare Other | Admitting: Family Medicine

## 2022-03-21 VITALS — BP 122/74 | HR 72 | Resp 16 | Ht 63.0 in | Wt 144.0 lb

## 2022-03-21 DIAGNOSIS — Z0000 Encounter for general adult medical examination without abnormal findings: Secondary | ICD-10-CM | POA: Diagnosis not present

## 2022-03-21 NOTE — Patient Instructions (Signed)

## 2022-03-21 NOTE — Progress Notes (Signed)
? ?Subjective:  ? Pamela Schwartz is a 81 y.o. female who presents for Medicare Annual (Subsequent) preventive examination.  This wellness visit is conducted by a nurse.  The patient's medications were reviewed and reconciled since the patient's last visit.  History details were provided by the patient.  The history appears to be reliable.   ? ?Medical History: Patient history and Family history was reviewed  ?Medications, Allergies, and preventative health maintenance was reviewed and updated. ? ?Cardiac Risk Factors include: advanced age (>73mn, >>66women);diabetes mellitus;dyslipidemia ? ?   ?Objective:  ?  ?Today's Vitals  ? 03/21/22 0958 03/21/22 1527  ?BP:  122/74  ?Pulse:  72  ?Resp:  16  ?SpO2:  98%  ?Weight:  144 lb (65.3 kg)  ?Height:  _0  (1.6 m)  ?PainSc: 0-No pain 0-No pain  ? ?Body mass index is 25.51 kg/m?. ? ? ?  02/01/2021  ? 10:00 AM  ?Advanced Directives  ?Does Patient Have a Medical Advance Directive? Yes  ?[place]  ?Does patient want to make changes to medical advance directive? No - Patient declined  ?Copy of [place]? No - copy requested  ? ? ?Current Medications (verified) ?Outpatient Encounter Medications as of 03/21/2022  ?Medication Sig  ? allopurinol (ZYLOPRIM) 100 MG tablet TAKE ONE TABLET BY MOUTH EVERY EVENING  ? amLODipine (NORVASC) 10 MG tablet TAKE ONE TABLET BY MOUTH EVERY EVENING  ? aspirin 81 MG tablet Take 81 mg by mouth daily.  ? chlorthalidone (HYGROTON) 25 MG tablet TAKE ONE TABLET BY MOUTH EVERY DAY  ? colchicine 0.6 MG tablet Take 1 TABLET by mouth in the morning and at bedtime. X 1 week for gout flare.  ? FARXIGA 5 MG TABS tablet TAKE ONE TABLET BY MOUTH EVERY DAY  ? lisinopril (ZESTRIL) 20 MG tablet Take 1 tablet (20 mg total) by mouth daily.  ? metoprolol succinate (TOPROL-XL) 100 MG 24 hr tablet TAKE 3 TABLETS BY MOUTH EVERY DAY  ? Multiple Vitamins-Minerals (PRESERVISION AREDS 2  PO) Take by mouth.  ? pantoprazole (PROTONIX) 40 MG tablet TAKE ONE TABLET BY MOUTH EVERY EVENING  ? potassium chloride SA (KLOR-CON M) 20 MEQ tablet Take 20 mEq by mouth 2 (two) times daily.  ? promethazine (PHENERGAN) 25 MG tablet take 1 tablet (25 mg) by oral route 4 times per day AS NEEDED nausea/vomiting]  ? rosuvastatin (CRESTOR) 40 MG tablet TAKE ONE TABLET BY MOUTH EVERY EVENING  ? traMADol (ULTRAM) 50 MG tablet TAKE ONE TABLET BY MOUTH EVERY 8 HOURS AS NEEDED FOR BACK PAIN  ? VASCEPA 1 g capsule TAKE 2 CAPSULES BY MOUTH TWICE DAILY  ? Vitamin D, Ergocalciferol, (DRISDOL) 1.25 MG (50000 UNIT) CAPS capsule TAKE ONE CAPSULE BY MOUTH ON SUNDAYS  ? zolpidem (AMBIEN CR) 12.5 MG CR tablet TAKE ONE TABLET BY MOUTH AT BEDTIME  ? ?No facility-administered encounter medications on file as of 03/21/2022.  ? ? ?Allergies (verified) ?Clonidine derivatives, Dexilant [dexlansoprazole], Hydralazine, Nickel, Tape, Tizanidine hcl, Zantac [ranitidine hcl], and Chantix [varenicline]  ? ?History: ?Past Medical History:  ?Diagnosis Date  ? Age-related osteoporosis without current pathological fracture   ? Arthritis   ? Barrett's esophagus without dysplasia   ? Cancer ([facility]   ? denies  ? Chronic kidney disease   ? [place]  ? Colitis 05/25/2020  ? Colitis 05/25/2020  ? GERD (gastroesophageal reflux disease)   ? Hearing loss   ?  History of COVID-19   ? Hypercholesterolemia   ? Hypertension   ? Hypertensive chronic kidney disease with stage 1 through stage 4 chronic kidney disease, or unspecified chronic kidney disease   ? IBS (irritable bowel syndrome)   ? Impaired fasting glucose   ? Low back pain   ? Mixed hyperlipidemia   ? Paresthesia of skin   ? Primary insomnia   ? Sacroiliac inflammation ([place]) 03/02/2020  ? Scars   ? Trochanteric bursitis of left hip 09/29/2020  ? Vitamin D deficiency, unspecified   ? ?Past Surgical History:  ?Procedure Laterality Date  ? ANTERIOR CERVICAL  DECOMP/DISCECTOMY FUSION N/A 03/06/2016  ? Procedure: ACDF - C5-C6 - C6-C7  ;  Surgeon: Earnie Larsson, MD;  Location: [place] [facility];  Service: Neurosurgery;  Laterality: N/A;  ACDF - C5-C6 - C6-C7    ? APPENDECTOMY    ? BUNIONECTOMY    ? right and left  ? CARDIAC CATHETERIZATION    ? had in [place] around 2012  ? CATARACT EXTRACTION W/ INTRAOCULAR LENS  IMPLANT, BILATERAL    ? CESAREAN SECTION    ? COLONOSCOPY W/ BIOPSIES AND POLYPECTOMY    ? MULTIPLE TOOTH EXTRACTIONS    ? TUBAL LIGATION    ? ?Family History  ?Problem Relation Age of Onset  ? Heart attack Mother   ? Heart attack Father   ? Hypertension Sister   ? Hypertension Brother   ? Renal Disease Brother   ? Heart attack Brother   ? Multiple myeloma Brother   ? Hypertension Sister   ? Hypertension Sister   ? Heart attack Sister   ? ?Social History  ? ?Socioeconomic History  ? Marital status: Married  ?  Spouse name: Not on file  ? Number of children: 3  ? Years of education: Not on file  ? Highest education level: Not on file  ?Occupational History  ? Occupation: Retired Therapist, sports  ?Tobacco Use  ? Smoking status: Former  ?  Packs/day: 0.50  ?  Types: Cigarettes  ?  Quit date: 10/02/2015  ?  Years since quitting: 6.4  ? Smokeless tobacco: Never  ?Vaping Use  ? Vaping Use: Never used  ?Substance and Sexual Activity  ? Alcohol use: No  ? Drug use: No  ? Sexual activity: Not on file  ?Other Topics Concern  ? Not on file  ?Social History Narrative  ? Not on file  ? ?Social Determinants of Health  ? ?Financial Resource Strain: Not on file  ?Food Insecurity: No Food Insecurity  ? Worried About Charity fundraiser in the Last Year: Never true  ? Ran Out of Food in the Last Year: Never true  ?Transportation Needs: No Transportation Needs  ? Lack of Transportation (Medical): No  ? Lack of Transportation (Non-Medical): No  ?Physical Activity: Insufficiently Active  ? Days of Exercise per Week: 3 days  ? Minutes of Exercise per Session: 30 min  ?Stress: No Stress Concern Present  ?  Feeling of Stress : Not at all  ?Social Connections: Not on file  ? ? ?Tobacco Counseling ?Counseling given: Not Answered ? ? ?Clinical Intake: ? ?Pre-visit preparation completed: Yes ? ?Pain : No/denies pain ?Pain Score: 0-No pain ? ?  ? ?BMI - recorded: 25.51 ?Nutritional Status: BMI 25 -29 Overweight ?Nutritional Risks: None ?Diabetes: Yes (A1C 6.0) ?CBG done?: No ?Did pt. bring in CBG monitor from home?: No ? ?How often do you need to have someone help you when you read  instructions, pamphlets, or other written materials from your doctor or pharmacy?: 1 - Never ? ?Interpreter Needed?: No ? ?  ? ? ?Activities of Daily Living ? ?  03/21/2022  ?  3:42 PM  ?In your present state of health, do you have any difficulty performing the following activities:  ?Hearing? 0  ?Vision? 0  ?Difficulty concentrating or making decisions? 0  ?Walking or climbing stairs? 0  ?Dressing or bathing? 0  ?Doing errands, shopping? 0  ?Preparing Food and eating ? N  ?Using the Toilet? N  ?In the past six months, have you accidently leaked urine? N  ?Do you have problems with loss of bowel control? N  ?Managing your Medications? N  ?Managing your Finances? N  ?Housekeeping or managing your Housekeeping? N  ? ? ?Patient Care Team: ?CoxElnita Maxwell, MD as PCP - General (Family Medicine) ?Justin Mend, MD as Attending Physician (Internal Medicine) ?Phylliss Bob, MD as Consulting Physician (Orthopedic Surgery) ?Normajean Glasgow, MD as Attending Physician (Physical Medicine and Rehabilitation) ?Evelina Bucy, DPM as Consulting Physician (Podiatry) ? ?   ?Assessment:  ? This is a routine wellness examination for Pamela Schwartz. ? ?Hearing/Vision screen ?No results found. ? ?Dietary issues and exercise activities discussed: ?Current Exercise Habits: Home exercise routine, Type of exercise: walking, Time (Minutes): 30, Frequency (Times/Week): 4, Weekly Exercise (Minutes/Week): 120, Intensity: Mild ? ?Depression Screen ? ?  03/21/2022  ?  3:34 PM 10/17/2021  ?   7:41 AM 07/13/2021  ?  7:41 AM 04/06/2021  ? 10:43 AM 02/01/2021  ? 10:01 AM 03/02/2020  ?  8:15 AM  ?PHQ 2/9 Scores  ?PHQ - 2 Score 0 0 0 0 0 0  ?  ?Fall Risk ? ?  03/21/2022  ?  3:42 PM 10/17/2021  ?  7:41 AM 07/21/2021  ?

## 2022-03-23 ENCOUNTER — Encounter: Payer: Self-pay | Admitting: Family Medicine

## 2022-03-27 LAB — SPECIMEN STATUS REPORT

## 2022-03-27 LAB — THYROTROPIN RECEPTOR AUTOABS: Thyrotropin Receptor Ab: <1.1 IU/L (ref 0.00–1.75)

## 2022-04-03 LAB — COMPREHENSIVE METABOLIC PANEL
ALT: 83 IU/L — ABNORMAL HIGH (ref 0–32)
AST: 73 IU/L — ABNORMAL HIGH (ref 0–40)
Albumin/Globulin Ratio: 1.7 (ref 1.2–2.2)
Albumin: 4.3 g/dL (ref 3.7–4.7)
Alkaline Phosphatase: 92 IU/L (ref 44–121)
BUN/Creatinine Ratio: 19 (ref 12–28)
BUN: 31 mg/dL — ABNORMAL HIGH (ref 8–27)
Bilirubin Total: 0.4 mg/dL (ref 0.0–1.2)
CO2: 26 mmol/L (ref 20–29)
Calcium: 9.6 mg/dL (ref 8.7–10.3)
Chloride: 103 mmol/L (ref 96–106)
Creatinine, Ser: 1.66 mg/dL — ABNORMAL HIGH (ref 0.57–1.00)
Globulin, Total: 2.5 g/dL (ref 1.5–4.5)
Glucose: 102 mg/dL — ABNORMAL HIGH (ref 70–99)
Potassium: 4 mmol/L (ref 3.5–5.2)
Sodium: 141 mmol/L (ref 134–144)
Total Protein: 6.8 g/dL (ref 6.0–8.5)
eGFR: 31 mL/min/{1.73_m2} — ABNORMAL LOW (ref >59)

## 2022-04-03 LAB — THYROTROPIN RECEPTOR AUTOABS

## 2022-04-03 LAB — T3: T3, Total: 91 ng/dL (ref 71–180)

## 2022-04-03 LAB — T3, FREE: T3, Free: 2.5 pg/mL (ref 2.0–4.4)

## 2022-04-04 DIAGNOSIS — Z20822 Contact with and (suspected) exposure to covid-19: Secondary | ICD-10-CM | POA: Diagnosis not present

## 2022-04-04 DIAGNOSIS — R630 Anorexia: Secondary | ICD-10-CM | POA: Diagnosis not present

## 2022-04-04 DIAGNOSIS — R197 Diarrhea, unspecified: Secondary | ICD-10-CM | POA: Diagnosis not present

## 2022-04-04 DIAGNOSIS — R509 Fever, unspecified: Secondary | ICD-10-CM | POA: Diagnosis not present

## 2022-04-04 DIAGNOSIS — R519 Headache, unspecified: Secondary | ICD-10-CM | POA: Diagnosis not present

## 2022-04-04 DIAGNOSIS — R112 Nausea with vomiting, unspecified: Secondary | ICD-10-CM | POA: Diagnosis not present

## 2022-04-04 DIAGNOSIS — Z87891 Personal history of nicotine dependence: Secondary | ICD-10-CM | POA: Diagnosis not present

## 2022-04-10 ENCOUNTER — Telehealth: Payer: Self-pay

## 2022-04-10 NOTE — Telephone Encounter (Signed)
Mr. Barrales called to report that Pamela Schwartz was seen in the ED 3 days ago and she continues to feel poorly.  She is having nausea and vomiting but no complaints of pain.   He reports no fever but she has no energy and no appetite.  Follow-up scheduled with Dr. Tobie Poet for tomorrow at 2:00 pm.  He was instructed to take her back to the ED if symptoms worsen before her appointment time.

## 2022-04-11 ENCOUNTER — Encounter: Payer: Self-pay | Admitting: Family Medicine

## 2022-04-11 ENCOUNTER — Ambulatory Visit (INDEPENDENT_AMBULATORY_CARE_PROVIDER_SITE_OTHER): Payer: Medicare Other | Admitting: Family Medicine

## 2022-04-11 VITALS — BP 138/98 | HR 98 | Temp 96.9°F | Wt 137.0 lb

## 2022-04-11 DIAGNOSIS — R10814 Left lower quadrant abdominal tenderness: Secondary | ICD-10-CM

## 2022-04-11 DIAGNOSIS — R10811 Right upper quadrant abdominal tenderness: Secondary | ICD-10-CM

## 2022-04-11 DIAGNOSIS — E860 Dehydration: Secondary | ICD-10-CM | POA: Diagnosis not present

## 2022-04-11 DIAGNOSIS — R112 Nausea with vomiting, unspecified: Secondary | ICD-10-CM

## 2022-04-11 DIAGNOSIS — R110 Nausea: Secondary | ICD-10-CM | POA: Insufficient documentation

## 2022-04-11 LAB — CBC WITH DIFFERENTIAL/PLATELET
Basophils Absolute: 0 10*3/uL (ref 0.0–0.2)
Basos: 0 %
EOS (ABSOLUTE): 0.1 10*3/uL (ref 0.0–0.4)
Eos: 1 %
Hematocrit: 48.1 % — ABNORMAL HIGH (ref 34.0–46.6)
Hemoglobin: 16.3 g/dL — ABNORMAL HIGH (ref 11.1–15.9)
Immature Grans (Abs): 0 10*3/uL (ref 0.0–0.1)
Immature Granulocytes: 0 %
Lymphocytes Absolute: 2.6 10*3/uL (ref 0.7–3.1)
Lymphs: 31 %
MCH: 30.6 pg (ref 26.6–33.0)
MCHC: 33.9 g/dL (ref 31.5–35.7)
MCV: 90 fL (ref 79–97)
Monocytes Absolute: 0.7 10*3/uL (ref 0.1–0.9)
Monocytes: 8 %
Neutrophils Absolute: 5 10*3/uL (ref 1.4–7.0)
Neutrophils: 60 %
Platelets: 156 10*3/uL (ref 150–450)
RBC: 5.33 x10E6/uL — ABNORMAL HIGH (ref 3.77–5.28)
RDW: 13.3 % (ref 11.7–15.4)
WBC: 8.4 10*3/uL (ref 3.4–10.8)

## 2022-04-11 LAB — COMPREHENSIVE METABOLIC PANEL WITH GFR
ALT: 27 IU/L (ref 0–32)
AST: 33 IU/L (ref 0–40)
Albumin/Globulin Ratio: 1.8 (ref 1.2–2.2)
Albumin: 4.5 g/dL (ref 3.7–4.7)
Alkaline Phosphatase: 77 IU/L (ref 44–121)
BUN/Creatinine Ratio: 17 (ref 12–28)
BUN: 29 mg/dL — ABNORMAL HIGH (ref 8–27)
Bilirubin Total: 0.5 mg/dL (ref 0.0–1.2)
CO2: 25 mmol/L (ref 20–29)
Calcium: 9.9 mg/dL (ref 8.7–10.3)
Chloride: 94 mmol/L — ABNORMAL LOW (ref 96–106)
Creatinine, Ser: 1.68 mg/dL — ABNORMAL HIGH (ref 0.57–1.00)
Globulin, Total: 2.5 g/dL (ref 1.5–4.5)
Glucose: 67 mg/dL — ABNORMAL LOW (ref 70–99)
Potassium: 3.7 mmol/L (ref 3.5–5.2)
Sodium: 142 mmol/L (ref 134–144)
Total Protein: 7 g/dL (ref 6.0–8.5)
eGFR: 31 mL/min/1.73 — ABNORMAL LOW

## 2022-04-11 LAB — AMYLASE: Amylase: 83 U/L (ref 31–110)

## 2022-04-11 LAB — LIPASE: Lipase: 51 U/L (ref 14–85)

## 2022-04-11 MED ORDER — ONDANSETRON 8 MG PO TBDP
8.0000 mg | ORAL_TABLET | Freq: Three times a day (TID) | ORAL | 0 refills | Status: DC | PRN
Start: 2022-04-11 — End: 2023-12-10

## 2022-04-11 NOTE — Patient Instructions (Signed)
Zofran sent to the pharmacy. Stat labs drawn today.  CBC, CMP, lipase, and amylase. Gallbladder ultrasound scheduled at [facility].  Arrive at 8:30 AM on April 12, 2022. N.p.o. after midnight. After gallbladder ultrasound return to registration and they will take you to special procedures for IV fluids.

## 2022-04-11 NOTE — Assessment & Plan Note (Signed)
Gallbladder ultrasound scheduled at [facility].  Arrive at 8:30 AM on April 12, 2022. N.p.o. after midnight. After gallbladder ultrasound return to registration and they will take you to special procedures for IV fluids.

## 2022-04-11 NOTE — Assessment & Plan Note (Addendum)
Increase Zofran to 8 mg 3 times daily as needed Nausea or vomiting. Check labs.

## 2022-04-11 NOTE — Progress Notes (Signed)
Subjective:  Patient ID: Pamela Schwartz, female    DOB: 1941-01-23  Age: 81 y.o. MRN: [id number]  Chief Complaint  Patient presents with   Follow-up    ED    HPI Patient presents in follow-up from her emergency department visit on Apr 04, 2022.  She presented complaining of decreased appetite, headache, nausea, vomiting, and diarrhea x 24-36 hrs..  Patient was given IV Zofran and Toradol.  She was given a liter of saline.  Stat CT of the brain without contrast was normal.  CT of the abdomen and pelvis without contrast was unremarkable. Labs CBC, troponins x2, liver function normal. UA was normal other than some glycosuria. Lipase was 333.  Lactic acid is 2.8.  Procalcitonin was normal. Patient had elevated creatinine and BUN.  Creatinine was 1.7.  BUN 31. EKG normal sinus rhythm no ST changes. Chest x-ray no acute changes.  Diagnosis nausea, vomiting, diarrhea, headaches.  No etiology found.  Patient was given Zofran 4 mg p.o. every 8 hours as needed and tramadol 50 mg every 4-6 hours as needed mild to moderate pain.  Other medications were continued.  Patient has not improved significantly.  She has continued to have nausea.  No significant abdominal pain.  Very poor appetite. Not drinking very much. Very weak.   Current Outpatient Medications on File Prior to Visit  Medication Sig Dispense Refill   allopurinol (ZYLOPRIM) 100 MG tablet TAKE ONE TABLET BY MOUTH EVERY EVENING 90 tablet 1   amLODipine (NORVASC) 10 MG tablet TAKE ONE TABLET BY MOUTH EVERY EVENING 90 tablet 3   aspirin 81 MG tablet Take 81 mg by mouth daily.     chlorthalidone (HYGROTON) 25 MG tablet TAKE ONE TABLET BY MOUTH EVERY DAY 90 tablet 1   colchicine 0.6 MG tablet Take 1 TABLET by mouth in the morning and at bedtime. X 1 week for gout flare. 14 tablet 2   FARXIGA 5 MG TABS tablet TAKE ONE TABLET BY MOUTH EVERY DAY 90 tablet 1   lisinopril (ZESTRIL) 20 MG tablet Take 1 tablet (20 mg total) by mouth daily. 90  tablet 1   metoprolol succinate (TOPROL-XL) 100 MG 24 hr tablet TAKE 3 TABLETS BY MOUTH EVERY DAY 270 tablet 3   Multiple Vitamins-Minerals (PRESERVISION AREDS 2 PO) Take by mouth.     pantoprazole (PROTONIX) 40 MG tablet TAKE ONE TABLET BY MOUTH EVERY EVENING 90 tablet 3   potassium chloride SA (KLOR-CON M) 20 MEQ tablet Take 20 mEq by mouth 2 (two) times daily.     promethazine (PHENERGAN) 25 MG tablet take 1 tablet (25 mg) by oral route 4 times per day AS NEEDED nausea/vomiting] 60 tablet 3   rosuvastatin (CRESTOR) 40 MG tablet TAKE ONE TABLET BY MOUTH EVERY EVENING 90 tablet 1   traMADol (ULTRAM) 50 MG tablet TAKE ONE TABLET BY MOUTH EVERY 8 HOURS AS NEEDED FOR BACK PAIN 90 tablet 2   VASCEPA 1 g capsule TAKE 2 CAPSULES BY MOUTH TWICE DAILY 360 capsule 1   Vitamin D, Ergocalciferol, (DRISDOL) 1.25 MG (50000 UNIT) CAPS capsule TAKE ONE CAPSULE BY MOUTH ON SUNDAYS 12 capsule 3   zolpidem (AMBIEN CR) 12.5 MG CR tablet TAKE ONE TABLET BY MOUTH AT BEDTIME 30 tablet 5   No current facility-administered medications on file prior to visit.   Past Medical History:  Diagnosis Date   Age-related osteoporosis without current pathological fracture    Arthritis    Barrett's esophagus without dysplasia    Cancer (  [place])    denies   Chronic kidney disease    Closed fracture of maxillary sinus ([place]) 07/23/2021   Colitis 05/25/2020   Colitis 05/25/2020   GERD (gastroesophageal reflux disease)    Hearing loss    History of COVID-19    Hypercholesterolemia    Hypertension    Hypertensive chronic kidney disease with stage 1 through stage 4 chronic kidney disease, or unspecified chronic kidney disease    IBS (irritable bowel syndrome)    Impaired fasting glucose    Low back pain    Mixed hyperlipidemia    Paresthesia of skin    Primary insomnia    Sacroiliac inflammation ([place]) 03/02/2020   Scars    Trochanteric bursitis of left hip 09/29/2020   Vitamin D deficiency, unspecified    Past Surgical  History:  Procedure Laterality Date   ANTERIOR CERVICAL DECOMP/DISCECTOMY FUSION N/A 03/06/2016   Procedure: ACDF - C5-C6 - C6-C7  ;  Surgeon: Earnie Larsson, MD;  Location: [facility];  Service: Neurosurgery;  Laterality: N/A;  ACDF - C5-C6 - C6-C7     APPENDECTOMY     BUNIONECTOMY     right and left   CARDIAC CATHETERIZATION     had in [place] around 2012   CATARACT EXTRACTION W/ INTRAOCULAR LENS  IMPLANT, BILATERAL     CESAREAN SECTION     COLONOSCOPY W/ BIOPSIES AND POLYPECTOMY     MULTIPLE TOOTH EXTRACTIONS     TUBAL LIGATION      Family History  Problem Relation Age of Onset   Heart attack Mother    Heart attack Father    Hypertension Sister    Hypertension Brother    Renal Disease Brother    Heart attack Brother    Multiple myeloma Brother    Hypertension Sister    Hypertension Sister    Heart attack Sister    Social History   Socioeconomic History   Marital status: Married    Spouse name: Not on file   Number of children: 3   Years of education: Not on file   Highest education level: Not on file  Occupational History   Occupation: Retired Therapist, sports  Tobacco Use   Smoking status: Former    Packs/day: 0.50    Types: Cigarettes    Quit date: 10/02/2015    Years since quitting: 6.5   Smokeless tobacco: Never  Vaping Use   Vaping Use: Never used  Substance and Sexual Activity   Alcohol use: No   Drug use: No   Sexual activity: Not on file  Other Topics Concern   Not on file  Social History Narrative   Not on file   Social Determinants of Health   Financial Resource Strain: Not on file  Food Insecurity: No Food Insecurity   Worried About Running Out of Food in the Last Year: Never true   [place] in the Last Year: Never true  Transportation Needs: No Transportation Needs   Lack of Transportation (Medical): No   Lack of Transportation (Non-Medical): No  Physical Activity: Insufficiently Active   Days of Exercise per Week: 3 days   Minutes of Exercise  per Session: 30 min  Stress: No Stress Concern Present   Feeling of Stress : Not at all  Social Connections: Not on file    Review of Systems  Constitutional:  Positive for fatigue.  Gastrointestinal:  Positive for nausea. Negative for diarrhea and vomiting.  Neurological:  Positive for weakness.  Objective:  BP (!) 138/98   Pulse 98   Temp (!) 96.9 F (36.1 C)   Wt 137 lb (62.1 kg)   SpO2 99%   BMI 24.27 kg/m      04/11/2022    2:12 PM 03/21/2022    3:27 PM 02/27/2022    8:02 AM  BP/Weight  Systolic BP 425 956 387  Diastolic BP 98 74 78  Wt. (Lbs) 137 144 143  BMI 24.27 kg/m2 25.51 kg/m2 25.33 kg/m2    Physical Exam Vitals reviewed.  Constitutional:      Appearance: She is ill-appearing.  Cardiovascular:     Rate and Rhythm: Normal rate and regular rhythm.     Heart sounds: Normal heart sounds.  Pulmonary:     Effort: Pulmonary effort is normal. No respiratory distress.     Breath sounds: Normal breath sounds.  Abdominal:     General: Abdomen is flat. Bowel sounds are normal.     Palpations: Abdomen is soft.     Tenderness: There is abdominal tenderness (RUQ. murphy's sign. LLQ). There is no guarding or rebound.  Neurological:     Mental Status: She is alert and oriented to person, place, and time.  Psychiatric:        Mood and Affect: Mood normal.        Behavior: Behavior normal.    Diabetic Foot Exam - Simple   No data filed      Lab Results  Component Value Date   WBC 11.6 (H) 02/27/2022   HGB 14.3 02/27/2022   HCT 43.4 02/27/2022   PLT 200 02/27/2022   GLUCOSE 102 (H) 03/13/2022   CHOL 126 02/27/2022   TRIG 100 02/27/2022   HDL 61 02/27/2022   LDLCALC 47 02/27/2022   ALT 83 (H) 03/13/2022   AST 73 (H) 03/13/2022   NA 141 03/13/2022   K 4.0 03/13/2022   CL 103 03/13/2022   CREATININE 1.66 (H) 03/13/2022   BUN 31 (H) 03/13/2022   CO2 26 03/13/2022   TSH 0.112 (L) 02/27/2022   HGBA1C 6.0 (H) 02/27/2022   MICROALBUR 150 10/17/2021    Assessment & Plan:   Problem List Items Addressed This Visit       Digestive   Nausea and vomiting - Primary    Increase Zofran to 8 mg 3 times daily as needed Nausea or vomiting. Check labs.       Relevant Orders   CBC with Differential/Platelet   Comprehensive metabolic panel   Amylase   Lipase   US Abdomen Limited RUQ (LIVER/GB)     Other   Right upper quadrant abdominal tenderness without rebound tenderness    Gallbladder ultrasound scheduled at [facility].  Arrive at 8:30 AM on April 12, 2022. N.p.o. after midnight. After gallbladder ultrasound return to registration and they will take you to special procedures for IV fluids.       Relevant Orders   US Abdomen Limited RUQ (LIVER/GB)   Left lower quadrant abdominal tenderness without rebound tenderness    Unclear etiology       Dehydration    Zofran sent to the pharmacy. Push fluids Stat labs drawn today.  CBC, CMP, lipase, and amylase. After gallbladder ultrasound return to registration and they will take you to special procedures for IV fluids.      .  Meds ordered this encounter  Medications   ondansetron (ZOFRAN-ODT) 8 MG disintegrating tablet    Sig: Take 1 tablet (8 mg total) by mouth  every 8 (eight) hours as needed for nausea or vomiting.    Dispense:  30 tablet    Refill:  0    Orders Placed This Encounter  Procedures   US Abdomen Limited RUQ (LIVER/GB)   CBC with Differential/Platelet   Comprehensive metabolic panel   Amylase   Lipase    Total time spent on today's visit was greater than 40 minutes, including both face-to-face time and nonface-to-face time personally spent on review of chart (labs and imaging), discussing labs and goals, discussing further work-up, treatment options, referrals to specialist if needed, reviewing outside records of pertinent, answering patient's questions, and coordinating care.   Follow-up: No follow-ups on file.  An After Visit Summary was printed  and given to the patient.  Rochel Brome, MD [facility] [phone]

## 2022-04-11 NOTE — Assessment & Plan Note (Signed)
Unclear etiology

## 2022-04-11 NOTE — Assessment & Plan Note (Signed)
Zofran sent to the pharmacy. Push fluids Stat labs drawn today.  CBC, CMP, lipase, and amylase. After gallbladder ultrasound return to registration and they will take you to special procedures for IV fluids.

## 2022-04-12 DIAGNOSIS — N281 Cyst of kidney, acquired: Secondary | ICD-10-CM | POA: Diagnosis not present

## 2022-04-12 DIAGNOSIS — R1011 Right upper quadrant pain: Secondary | ICD-10-CM | POA: Diagnosis not present

## 2022-04-13 ENCOUNTER — Other Ambulatory Visit: Payer: Self-pay

## 2022-04-13 DIAGNOSIS — R112 Nausea with vomiting, unspecified: Secondary | ICD-10-CM

## 2022-04-13 DIAGNOSIS — R10811 Right upper quadrant abdominal tenderness: Secondary | ICD-10-CM

## 2022-04-15 DIAGNOSIS — Z7984 Long term (current) use of oral hypoglycemic drugs: Secondary | ICD-10-CM | POA: Diagnosis not present

## 2022-04-15 DIAGNOSIS — R112 Nausea with vomiting, unspecified: Secondary | ICD-10-CM | POA: Diagnosis not present

## 2022-04-15 DIAGNOSIS — R1900 Intra-abdominal and pelvic swelling, mass and lump, unspecified site: Secondary | ICD-10-CM | POA: Diagnosis not present

## 2022-04-15 DIAGNOSIS — E119 Type 2 diabetes mellitus without complications: Secondary | ICD-10-CM | POA: Diagnosis not present

## 2022-04-15 DIAGNOSIS — E860 Dehydration: Secondary | ICD-10-CM | POA: Diagnosis not present

## 2022-04-15 DIAGNOSIS — E876 Hypokalemia: Secondary | ICD-10-CM | POA: Diagnosis not present

## 2022-04-15 DIAGNOSIS — R531 Weakness: Secondary | ICD-10-CM | POA: Diagnosis not present

## 2022-04-15 DIAGNOSIS — I361 Nonrheumatic tricuspid (valve) insufficiency: Secondary | ICD-10-CM | POA: Diagnosis not present

## 2022-04-15 DIAGNOSIS — E0590 Thyrotoxicosis, unspecified without thyrotoxic crisis or storm: Secondary | ICD-10-CM | POA: Diagnosis present

## 2022-04-15 DIAGNOSIS — N179 Acute kidney failure, unspecified: Secondary | ICD-10-CM | POA: Diagnosis not present

## 2022-04-15 DIAGNOSIS — Z888 Allergy status to other drugs, medicaments and biological substances status: Secondary | ICD-10-CM | POA: Diagnosis not present

## 2022-04-15 DIAGNOSIS — E872 Acidosis, unspecified: Secondary | ICD-10-CM | POA: Diagnosis not present

## 2022-04-15 DIAGNOSIS — Z79899 Other long term (current) drug therapy: Secondary | ICD-10-CM | POA: Diagnosis not present

## 2022-04-15 DIAGNOSIS — K219 Gastro-esophageal reflux disease without esophagitis: Secondary | ICD-10-CM | POA: Diagnosis not present

## 2022-04-15 DIAGNOSIS — M1990 Unspecified osteoarthritis, unspecified site: Secondary | ICD-10-CM | POA: Diagnosis not present

## 2022-04-15 DIAGNOSIS — R1011 Right upper quadrant pain: Secondary | ICD-10-CM | POA: Diagnosis not present

## 2022-04-15 DIAGNOSIS — N183 Chronic kidney disease, stage 3 unspecified: Secondary | ICD-10-CM | POA: Diagnosis not present

## 2022-04-15 DIAGNOSIS — I4891 Unspecified atrial fibrillation: Secondary | ICD-10-CM | POA: Diagnosis present

## 2022-04-15 DIAGNOSIS — Z7982 Long term (current) use of aspirin: Secondary | ICD-10-CM | POA: Diagnosis not present

## 2022-04-15 DIAGNOSIS — D259 Leiomyoma of uterus, unspecified: Secondary | ICD-10-CM | POA: Diagnosis not present

## 2022-04-15 DIAGNOSIS — R1110 Vomiting, unspecified: Secondary | ICD-10-CM | POA: Diagnosis not present

## 2022-04-15 DIAGNOSIS — I129 Hypertensive chronic kidney disease with stage 1 through stage 4 chronic kidney disease, or unspecified chronic kidney disease: Secondary | ICD-10-CM | POA: Diagnosis present

## 2022-04-15 DIAGNOSIS — I21A1 Myocardial infarction type 2: Secondary | ICD-10-CM | POA: Diagnosis not present

## 2022-04-15 DIAGNOSIS — Z20822 Contact with and (suspected) exposure to covid-19: Secondary | ICD-10-CM | POA: Diagnosis present

## 2022-04-15 DIAGNOSIS — E7800 Pure hypercholesterolemia, unspecified: Secondary | ICD-10-CM | POA: Diagnosis present

## 2022-04-24 ENCOUNTER — Encounter: Payer: Self-pay | Admitting: Family Medicine

## 2022-04-24 ENCOUNTER — Ambulatory Visit (INDEPENDENT_AMBULATORY_CARE_PROVIDER_SITE_OTHER): Payer: Medicare Other | Admitting: Family Medicine

## 2022-04-24 VITALS — BP 104/60 | HR 84 | Temp 96.0°F | Resp 14 | Ht 63.0 in | Wt 136.2 lb

## 2022-04-24 DIAGNOSIS — E0590 Thyrotoxicosis, unspecified without thyrotoxic crisis or storm: Secondary | ICD-10-CM

## 2022-04-24 DIAGNOSIS — N1832 Chronic kidney disease, stage 3b: Secondary | ICD-10-CM

## 2022-04-24 DIAGNOSIS — I480 Paroxysmal atrial fibrillation: Secondary | ICD-10-CM

## 2022-04-24 MED ORDER — METHIMAZOLE 5 MG PO TABS: 5.0000 mg | ORAL_TABLET | Freq: Three times a day (TID) | ORAL | 2 refills | Status: DC

## 2022-04-24 NOTE — Progress Notes (Signed)
Subjective:  Patient ID: Pamela Schwartz, female    DOB: 1941-04-06  Age: 81 y.o. MRN: [id number]  Chief Complaint  Patient presents with   Atrial Fibrillation    HPI Patient is a 81 year old white female presents for hospital follow-up.  Patient was admitted to [facility] from June 4 to June 6.  She had new onset atrial fibrillation.  She was also nauseated, vomiting and acidotic and with dehydration.  The presenting symptoms to the emergency department with generalized weakness due to nausea and vomiting and decreased p.o. intake.  She is not experiencing any diarrhea.  Patient was found to have a metabolic acidosis with a high anion gap, acute kidney injury, gray zone troponins, and was in rapid atrial fibrillation up to 180s.  This resolved with a Cardizem drip.  CT scan of the abdomen and pelvis showed no acute findings.  She did have stable moderate left renal atrophy as well as a uterine fibroid.  Right upper quadrant ultrasound was normal.  Chest x-ray was normal.  She is following up today for thyroid function testing and adjustment to any medications needed.  Metabolic acidosis felt to be due to combination of farxiga with nausea, vomiting, dehydration.  This improved with rehydration bicarb drip and rehab creatinine was 1.9 when she was admitted and went down to 1.1 prior to discharge. Hypokalemia was resolved with supplementation.  Atrial fibrillation: echo appeared normal.  Elevated troponins were thought to be secondary to demand ischemia she confirms that she is on Toprol 300 mg at home and she was discharged on 200 mg currently heart rate was 80-100 upon discharge.  Current Outpatient Medications on File Prior to Visit  Medication Sig Dispense Refill   allopurinol (ZYLOPRIM) 100 MG tablet TAKE ONE TABLET BY MOUTH EVERY EVENING 90 tablet 1   amLODipine (NORVASC) 10 MG tablet TAKE ONE TABLET BY MOUTH EVERY EVENING 90 tablet 3   aspirin 81 MG tablet Take 81 mg by mouth daily.      chlorthalidone (HYGROTON) 25 MG tablet TAKE ONE TABLET BY MOUTH EVERY DAY 90 tablet 1   colchicine 0.6 MG tablet Take 1 TABLET by mouth in the morning and at bedtime. X 1 week for gout flare. 14 tablet 2   lisinopril (ZESTRIL) 20 MG tablet Take 1 tablet (20 mg total) by mouth daily. 90 tablet 1   metoprolol succinate (TOPROL-XL) 100 MG 24 hr tablet TAKE 3 TABLETS BY MOUTH EVERY DAY (Patient taking differently: 200 mg.) 270 tablet 3   Multiple Vitamins-Minerals (PRESERVISION AREDS 2 PO) Take by mouth.     ondansetron (ZOFRAN-ODT) 8 MG disintegrating tablet Take 1 tablet (8 mg total) by mouth every 8 (eight) hours as needed for nausea or vomiting. 30 tablet 0   pantoprazole (PROTONIX) 40 MG tablet TAKE ONE TABLET BY MOUTH EVERY EVENING 90 tablet 3   promethazine (PHENERGAN) 25 MG tablet take 1 tablet (25 mg) by oral route 4 times per day AS NEEDED nausea/vomiting] 60 tablet 3   rosuvastatin (CRESTOR) 40 MG tablet TAKE ONE TABLET BY MOUTH EVERY EVENING 90 tablet 1   traMADol (ULTRAM) 50 MG tablet TAKE ONE TABLET BY MOUTH EVERY 8 HOURS AS NEEDED FOR BACK PAIN 90 tablet 2   VASCEPA 1 g capsule TAKE 2 CAPSULES BY MOUTH TWICE DAILY 360 capsule 1   zolpidem (AMBIEN CR) 12.5 MG CR tablet TAKE ONE TABLET BY MOUTH AT BEDTIME 30 tablet 5   No current facility-administered medications on file prior to visit.  Past Medical History:  Diagnosis Date   Age-related osteoporosis without current pathological fracture    Arthritis    Barrett's esophagus without dysplasia    Cancer ([place])    denies   Chronic kidney disease    Closed fracture of maxillary sinus ([place]) 07/23/2021   Colitis 05/25/2020   Colitis 05/25/2020   GERD (gastroesophageal reflux disease)    Hearing loss    History of COVID-19    Hypercholesterolemia    Hypertension    Hypertensive chronic kidney disease with stage 1 through stage 4 chronic kidney disease, or unspecified chronic kidney disease    IBS (irritable bowel syndrome)     Impaired fasting glucose    Low back pain    Mixed hyperlipidemia    Paresthesia of skin    Primary insomnia    Sacroiliac inflammation ([place]) 03/02/2020   Scars    Trochanteric bursitis of left hip 09/29/2020   Vitamin D deficiency, unspecified    Past Surgical History:  Procedure Laterality Date   ANTERIOR CERVICAL DECOMP/DISCECTOMY FUSION N/A 03/06/2016   Procedure: ACDF - C5-C6 - C6-C7  ;  Surgeon: Earnie Larsson, MD;  Location: [facility];  Service: Neurosurgery;  Laterality: N/A;  ACDF - C5-C6 - C6-C7     APPENDECTOMY     BUNIONECTOMY     right and left   CARDIAC CATHETERIZATION     had in [place] around 2012   CATARACT EXTRACTION W/ INTRAOCULAR LENS  IMPLANT, BILATERAL     CESAREAN SECTION     COLONOSCOPY W/ BIOPSIES AND POLYPECTOMY     MULTIPLE TOOTH EXTRACTIONS     TUBAL LIGATION      Family History  Problem Relation Age of Onset   Heart attack Mother    Heart attack Father    Hypertension Sister    Hypertension Brother    Renal Disease Brother    Heart attack Brother    Multiple myeloma Brother    Hypertension Sister    Hypertension Sister    Heart attack Sister    Social History   Socioeconomic History   Marital status: Married    Spouse name: Not on file   Number of children: 3   Years of education: Not on file   Highest education level: Not on file  Occupational History   Occupation: Retired Therapist, sports  Tobacco Use   Smoking status: Former    Packs/day: 0.50    Types: Cigarettes    Quit date: 10/02/2015    Years since quitting: 6.5   Smokeless tobacco: Never  Vaping Use   Vaping Use: Never used  Substance and Sexual Activity   Alcohol use: No   Drug use: No   Sexual activity: Not on file  Other Topics Concern   Not on file  Social History Narrative   Not on file   Social Determinants of Health   Financial Resource Strain: Not on file  Food Insecurity: No Food Insecurity (03/21/2022)   Hunger Vital Sign    Worried About Running Out of Food in the  Last Year: Never true    Ran Out of Food in the Last Year: Never true  Transportation Needs: No Transportation Needs (03/21/2022)   PRAPARE - Hydrologist (Medical): No    Lack of Transportation (Non-Medical): No  Physical Activity: Insufficiently Active (03/21/2022)   Exercise Vital Sign    Days of Exercise per Week: 3 days    Minutes of Exercise per Session: 30  min  Stress: No Stress Concern Present (03/21/2022)   [place]    Feeling of Stress : Not at all  Social Connections: Not on file    Review of Systems  Constitutional:  Positive for fatigue. Negative for chills, diaphoresis and fever.  HENT:  Negative for congestion, rhinorrhea and sore throat.   Respiratory:  Negative for cough and shortness of breath.   Cardiovascular:  Negative for chest pain.  Gastrointestinal:  Positive for nausea. Negative for abdominal pain, constipation, diarrhea and vomiting.  Genitourinary:  Negative for dysuria and urgency.  Musculoskeletal:  Positive for back pain. Negative for myalgias.  Neurological:  Positive for weakness. Negative for dizziness, light-headedness and headaches.  Psychiatric/Behavioral:  Negative for dysphoric mood. The patient is not nervous/anxious.      Objective:  BP 104/60   Pulse 84   Temp (!) 96 F (35.6 C)   Resp 14   Ht $R'5\' 3"'ix$  (1.6 m)   Wt 136 lb 3.2 oz (61.8 kg)   BMI 24.13 kg/m      04/24/2022   10:58 AM 04/11/2022    2:12 PM 03/21/2022    3:27 PM  BP/Weight  Systolic BP 086 578 469  Diastolic BP 60 98 74  Wt. (Lbs) 136.2 137 144  BMI 24.13 kg/m2 24.27 kg/m2 25.51 kg/m2    Physical Exam Vitals reviewed.  Constitutional:      Appearance: Normal appearance. She is normal weight.  Neck:     Vascular: No carotid bruit.  Cardiovascular:     Rate and Rhythm: Normal rate and regular rhythm.     Heart sounds: Normal heart sounds.  Pulmonary:     Effort:  Pulmonary effort is normal. No respiratory distress.     Breath sounds: Normal breath sounds.  Abdominal:     General: Abdomen is flat. Bowel sounds are normal.     Palpations: Abdomen is soft.     Tenderness: There is no abdominal tenderness.  Neurological:     Mental Status: She is alert and oriented to person, place, and time.  Psychiatric:        Mood and Affect: Mood normal.        Behavior: Behavior normal.     Diabetic Foot Exam - Simple   No data filed      Lab Results  Component Value Date   WBC 8.4 04/24/2022   HGB 14.8 04/24/2022   HCT 42.8 04/24/2022   PLT 213 04/24/2022   GLUCOSE 93 04/24/2022   CHOL 126 02/27/2022   TRIG 100 02/27/2022   HDL 61 02/27/2022   LDLCALC 47 02/27/2022   ALT 30 04/24/2022   AST 29 04/24/2022   NA 145 (H) 04/24/2022   K 4.0 04/24/2022   CL 105 04/24/2022   CREATININE 1.99 (H) 04/24/2022   BUN 25 04/24/2022   CO2 24 04/24/2022   TSH 0.112 (L) 02/27/2022   HGBA1C 6.0 (H) 02/27/2022   MICROALBUR 150 10/17/2021      Assessment & Plan:   Problem List Items Addressed This Visit       Cardiovascular and Mediastinum   Paroxysmal atrial fibrillation ([place]) - Primary    Refer to cardiology.  Continue toprol xl 200 mg daily       Relevant Orders   CBC with Differential/Platelet (Completed)   Comprehensive metabolic panel (Completed)   Ambulatory referral to Cardiology     Endocrine   Hyperthyroidism    Start methimazole  5 mg twice daily.  Refer to endocrinology.       Relevant Medications   methimazole (TAPAZOLE) 5 MG tablet   Other Relevant Orders   Ambulatory referral to Endocrinology     Genitourinary   Stage 3b chronic kidney disease ([place])    Check labs.     .  Meds ordered this encounter  Medications   methimazole (TAPAZOLE) 5 MG tablet    Sig: Take 1 tablet (5 mg total) by mouth 3 (three) times daily.    Dispense:  90 tablet    Refill:  2    Orders Placed This Encounter  Procedures   CBC with  Differential/Platelet   Comprehensive metabolic panel   Ambulatory referral to Cardiology   Ambulatory referral to Endocrinology     Follow-up: Return in about 6 weeks (around 06/05/2022) for chronic follow up.  An After Visit Summary was printed and given to the patient.  Rochel Brome, MD [facility] [phone]

## 2022-04-24 NOTE — Patient Instructions (Signed)
Continue Toprol XL 200 mg daily. STAY HYDRATED! You need 64 oz water daily or electrolyte replacement drink such as gatorade (G2 - for diabetics.).  Start on methimazole for hyperthyroidism.  Refer to endocrinology.  Refer to cardiology.

## 2022-04-25 LAB — CBC WITH DIFFERENTIAL/PLATELET
Basophils Absolute: 0 10*3/uL (ref 0.0–0.2)
Basos: 1 %
EOS (ABSOLUTE): 0.2 10*3/uL (ref 0.0–0.4)
Eos: 2 %
Hematocrit: 42.8 % (ref 34.0–46.6)
Hemoglobin: 14.8 g/dL (ref 11.1–15.9)
Immature Grans (Abs): 0 10*3/uL (ref 0.0–0.1)
Immature Granulocytes: 0 %
Lymphocytes Absolute: 2.1 10*3/uL (ref 0.7–3.1)
Lymphs: 25 %
MCH: 31 pg (ref 26.6–33.0)
MCHC: 34.6 g/dL (ref 31.5–35.7)
MCV: 90 fL (ref 79–97)
Monocytes Absolute: 1.1 10*3/uL — ABNORMAL HIGH (ref 0.1–0.9)
Monocytes: 13 %
Neutrophils Absolute: 5 10*3/uL (ref 1.4–7.0)
Neutrophils: 59 %
Platelets: 213 10*3/uL (ref 150–450)
RBC: 4.78 x10E6/uL (ref 3.77–5.28)
RDW: 13.6 % (ref 11.7–15.4)
WBC: 8.4 10*3/uL (ref 3.4–10.8)

## 2022-04-25 LAB — COMPREHENSIVE METABOLIC PANEL
ALT: 30 IU/L (ref 0–32)
AST: 29 IU/L (ref 0–40)
Albumin/Globulin Ratio: 1.7 (ref 1.2–2.2)
Albumin: 4 g/dL (ref 3.7–4.7)
Alkaline Phosphatase: 73 IU/L (ref 44–121)
BUN/Creatinine Ratio: 13 (ref 12–28)
BUN: 25 mg/dL (ref 8–27)
Bilirubin Total: 0.2 mg/dL (ref 0.0–1.2)
CO2: 24 mmol/L (ref 20–29)
Calcium: 9.4 mg/dL (ref 8.7–10.3)
Chloride: 105 mmol/L (ref 96–106)
Creatinine, Ser: 1.99 mg/dL — ABNORMAL HIGH (ref 0.57–1.00)
Globulin, Total: 2.4 g/dL (ref 1.5–4.5)
Glucose: 93 mg/dL (ref 70–99)
Potassium: 4 mmol/L (ref 3.5–5.2)
Sodium: 145 mmol/L — ABNORMAL HIGH (ref 134–144)
Total Protein: 6.4 g/dL (ref 6.0–8.5)
eGFR: 25 mL/min/{1.73_m2} — ABNORMAL LOW (ref >59)

## 2022-04-25 NOTE — Progress Notes (Signed)
Blood count normal.  Liver function normal.  Kidney function abnormal. Worsened. Recommend increase water intake.  Follow up with nephrology.

## 2022-04-28 DIAGNOSIS — I480 Paroxysmal atrial fibrillation: Secondary | ICD-10-CM | POA: Insufficient documentation

## 2022-04-28 DIAGNOSIS — E0590 Thyrotoxicosis, unspecified without thyrotoxic crisis or storm: Secondary | ICD-10-CM | POA: Insufficient documentation

## 2022-04-28 HISTORY — DX: Paroxysmal atrial fibrillation: I48.0

## 2022-04-28 NOTE — Assessment & Plan Note (Signed)
Check labs 

## 2022-04-28 NOTE — Assessment & Plan Note (Addendum)
Refer to cardiology.  Continue toprol xl 200 mg daily

## 2022-04-28 NOTE — Assessment & Plan Note (Signed)
Start methimazole 5 mg twice daily.  Refer to endocrinology.

## 2022-05-16 ENCOUNTER — Encounter: Payer: Self-pay | Admitting: Cardiology

## 2022-05-16 ENCOUNTER — Ambulatory Visit (INDEPENDENT_AMBULATORY_CARE_PROVIDER_SITE_OTHER): Payer: Medicare Other

## 2022-05-16 ENCOUNTER — Ambulatory Visit (INDEPENDENT_AMBULATORY_CARE_PROVIDER_SITE_OTHER): Payer: Medicare Other | Admitting: Cardiology

## 2022-05-16 VITALS — BP 120/70 | HR 77 | Ht 63.0 in | Wt 142.4 lb

## 2022-05-16 DIAGNOSIS — E782 Mixed hyperlipidemia: Secondary | ICD-10-CM | POA: Diagnosis not present

## 2022-05-16 DIAGNOSIS — I700 Atherosclerosis of aorta: Secondary | ICD-10-CM | POA: Diagnosis not present

## 2022-05-16 DIAGNOSIS — I480 Paroxysmal atrial fibrillation: Secondary | ICD-10-CM | POA: Diagnosis not present

## 2022-05-16 NOTE — Progress Notes (Signed)
Cardiology Consultation:    Date:  05/16/2022   ID:  Pamela Schwartz, DOB 1941-08-18, MRN [id number]  PCP:  Blane Ohara, MD  Cardiologist:  Gypsy Balsam, MD   Referring MD: Blane Ohara, MD   Chief Complaint  Patient presents with   Atrial Fibrillation    History of Present Illness:    Pamela Schwartz is a 81 y.o. female who is being seen today for the evaluation of atrial fibrillation at the request of Cox, Kirsten, MD. past medical history significant for essential hypertension, atherosclerosis, kidney failure stage IIIb, thyroid problem.  Recently she ended up going to the hospital because of nausea and vomiting, she was find to be severely dehydrated she was also noted to be in atrial fibrillation.  She did have minimally elevated troponin I.  She was properly resuscitated with fluid, she was continue with beta-blocker, she eventually converted to sinus rhythm.  She also had very low TSH.  After that she was discharged home and doing quite well.  She came to me to be established as a patient.  She did not have any feeling of palpitation when she had atrial fibrillation.  She just simply felt awful because of episode of nausea and vomiting and simply dehydration.  Since the time she was discharged from hospital she is doing great denies have any chest pain tightness squeezing pressure burning chest.  She does what she wants to do with no difficulties.  She tripped recently and fell on the wall and hit her face there is a bruise on the right side of her face.  Past Medical History:  Diagnosis Date   Age-related osteoporosis without current pathological fracture    Arthritis    Barrett's esophagus without dysplasia    Cancer (HCC)    denies   Chronic kidney disease    Closed fracture of maxillary sinus (HCC) 07/23/2021   Colitis 05/25/2020   Colitis 05/25/2020   GERD (gastroesophageal reflux disease)    Hearing loss    History of COVID-19    Hypercholesterolemia    Hypertension     Hypertensive chronic kidney disease with stage 1 through stage 4 chronic kidney disease, or unspecified chronic kidney disease    IBS (irritable bowel syndrome)    Impaired fasting glucose    Low back pain    Mixed hyperlipidemia    Paresthesia of skin    Primary insomnia    Sacroiliac inflammation (HCC) 03/02/2020   Scars    Trochanteric bursitis of left hip 09/29/2020   Vitamin D deficiency, unspecified     Past Surgical History:  Procedure Laterality Date   ANTERIOR CERVICAL DECOMP/DISCECTOMY FUSION N/A 03/06/2016   Procedure: ACDF - C5-C6 - C6-C7  ;  Surgeon: Julio Sicks, MD;  Location: [facility];  Service: Neurosurgery;  Laterality: N/A;  ACDF - C5-C6 - C6-C7     APPENDECTOMY     BUNIONECTOMY     right and left   CARDIAC CATHETERIZATION     had in [place] around 2012   CATARACT EXTRACTION W/ INTRAOCULAR LENS  IMPLANT, BILATERAL     CESAREAN SECTION     COLONOSCOPY W/ BIOPSIES AND POLYPECTOMY     MULTIPLE TOOTH EXTRACTIONS     TUBAL LIGATION      Current Medications: Current Meds  Medication Sig   allopurinol (ZYLOPRIM) 100 MG tablet TAKE ONE TABLET BY MOUTH EVERY EVENING (Patient taking differently: Take 100 mg by mouth daily after supper.)   amLODipine (NORVASC) 10 MG tablet  TAKE ONE TABLET BY MOUTH EVERY EVENING (Patient taking differently: Take 10 mg by mouth daily.)   aspirin 81 MG tablet Take 81 mg by mouth daily.   chlorthalidone (HYGROTON) 25 MG tablet TAKE ONE TABLET BY MOUTH EVERY DAY (Patient taking differently: Take 25 mg by mouth daily.)   colchicine 0.6 MG tablet Take 1 TABLET by mouth in the morning and at bedtime. X 1 week for gout flare. (Patient taking differently: Take 0.6 mg by mouth 2 (two) times daily.)   lisinopril (ZESTRIL) 20 MG tablet Take 1 tablet (20 mg total) by mouth daily.   methimazole (TAPAZOLE) 5 MG tablet Take 1 tablet (5 mg total) by mouth 3 (three) times daily.   metoprolol succinate (TOPROL-XL) 100 MG 24 hr tablet TAKE 3 TABLETS BY  MOUTH EVERY DAY (Patient taking differently: Take 200 mg by mouth daily.)   Multiple Vitamins-Minerals (PRESERVISION AREDS 2 PO) Take 1 tablet by mouth daily.   ondansetron (ZOFRAN-ODT) 8 MG disintegrating tablet Take 1 tablet (8 mg total) by mouth every 8 (eight) hours as needed for nausea or vomiting.   pantoprazole (PROTONIX) 40 MG tablet TAKE ONE TABLET BY MOUTH EVERY EVENING (Patient taking differently: Take 40 mg by mouth daily.)   promethazine (PHENERGAN) 25 MG tablet take 1 tablet (25 mg) by oral route 4 times per day AS NEEDED nausea/vomiting] (Patient taking differently: Take 25 mg by mouth every 6 (six) hours as needed for nausea or vomiting. take 1 tablet (25 mg) by oral route 4 times per day AS NEEDED nausea/vomiting])   rosuvastatin (CRESTOR) 40 MG tablet TAKE ONE TABLET BY MOUTH EVERY EVENING (Patient taking differently: Take 40 mg by mouth daily.)   traMADol (ULTRAM) 50 MG tablet TAKE ONE TABLET BY MOUTH EVERY 8 HOURS AS NEEDED FOR BACK PAIN (Patient taking differently: Take 50 mg by mouth every 8 (eight) hours as needed for moderate pain or severe pain. TAKE ONE TABLET BY MOUTH EVERY 8 HOURS AS NEEDED FOR BACK PAIN)   VASCEPA 1 g capsule TAKE 2 CAPSULES BY MOUTH TWICE DAILY (Patient taking differently: Take 2 g by mouth 2 (two) times daily.)   zolpidem (AMBIEN CR) 12.5 MG CR tablet TAKE ONE TABLET BY MOUTH AT BEDTIME (Patient taking differently: Take 12.5 mg by mouth at bedtime as needed for sleep.)     Allergies:   Clonidine derivatives, Dexilant [dexlansoprazole], Hydralazine, Nickel, Tape, Tizanidine hcl, Zantac [ranitidine hcl], and Chantix [varenicline]   Social History   Socioeconomic History   Marital status: Married    Spouse name: Not on file   Number of children: 3   Years of education: Not on file   Highest education level: Not on file  Occupational History   Occupation: Retired Therapist, sports  Tobacco Use   Smoking status: Former    Packs/day: 0.50    Types: Cigarettes     Quit date: 10/02/2015    Years since quitting: 6.6   Smokeless tobacco: Never  Vaping Use   Vaping Use: Never used  Substance and Sexual Activity   Alcohol use: No   Drug use: No   Sexual activity: Not on file  Other Topics Concern   Not on file  Social History Narrative   Not on file   Social Determinants of Health   Financial Resource Strain: Not on file  Food Insecurity: No Food Insecurity (03/21/2022)   Hunger Vital Sign    Worried About Running Out of Food in the Last Year: Never true  Ran Out of Food in the Last Year: Never true  Transportation Needs: No Transportation Needs (03/21/2022)   PRAPARE - Hydrologist (Medical): No    Lack of Transportation (Non-Medical): No  Physical Activity: Insufficiently Active (03/21/2022)   Exercise Vital Sign    Days of Exercise per Week: 3 days    Minutes of Exercise per Session: 30 min  Stress: No Stress Concern Present (03/21/2022)   [place]    Feeling of Stress : Not at all  Social Connections: Not on file     Family History: The patient's family history includes Heart attack in her brother, father, mother, and sister; Hypertension in her brother, sister, sister, and sister; Multiple myeloma in her brother; Renal Disease in her brother. ROS:   Please see the history of present illness.    All 14 point review of systems negative except as described per history of present illness.  EKGs/Labs/Other Studies Reviewed:    The following studies were reviewed today: I did review record from hospital including echocardiogram showing preserved ejection fraction normal atrial size no significant valvular pathology  EKG:  EKG is  ordered today.  The ekg ordered today demonstrates normal sinus rhythm normal P interval nonspecific ST segment changes criteria for LVH borderline  Recent Labs: 02/27/2022: TSH 0.112 04/24/2022: ALT 30; BUN 25;  Creatinine, Ser 1.99; Hemoglobin 14.8; Platelets 213; Potassium 4.0; Sodium 145  Recent Lipid Panel    Component Value Date/Time   CHOL 126 02/27/2022 0828   TRIG 100 02/27/2022 0828   HDL 61 02/27/2022 0828   CHOLHDL 2.1 02/27/2022 0828   LDLCALC 47 02/27/2022 0828    Physical Exam:    VS:  BP 120/70 (BP Location: Left Arm, Patient Position: Sitting)   Pulse 77   Ht $R'5\' 3"'Mk$  (1.6 m)   Wt 142 lb 6.4 oz (64.6 kg)   SpO2 98%   BMI 25.23 kg/m     Wt Readings from Last 3 Encounters:  05/16/22 142 lb 6.4 oz (64.6 kg)  04/24/22 136 lb 3.2 oz (61.8 kg)  04/11/22 137 lb (62.1 kg)     GEN:  Well nourished, well developed in no acute distress HEENT: Normal NECK: No JVD; No carotid bruits LYMPHATICS: No lymphadenopathy CARDIAC: RRR, no murmurs, no rubs, no gallops RESPIRATORY:  Clear to auscultation without rales, wheezing or rhonchi  ABDOMEN: Soft, non-tender, non-distended MUSCULOSKELETAL:  No edema; No deformity  SKIN: Warm and dry NEUROLOGIC:  Alert and oriented x 3 PSYCHIATRIC:  Normal affect   ASSESSMENT:    1. Paroxysmal atrial fibrillation (HCC)   2. Abdominal aortic atherosclerosis ([place])   3. Mixed hyperlipidemia    PLAN:    In order of problems listed above:  Paroxysmal atrial fibrillation 1 documented episode of atrial fibrillation that happened when she was very sick while in the hospital.  She was sick with GI problem.  She never had atrial fibrillation before her atrial size is normal I suspect this atrial fibrillation happen only when she was sick however I think she deserves to wear a monitor for about a month to see if there are any recurrences of atrial fibrillation.  In the meantime she is not anticoagulated.  She is taking only aspirin.  Obviously if we find recurrences of atrial fibrillation we will initiate anticoagulation. Mixed dyslipidemia, I did review her K PN which show me her LDL of 47 HDL 61 excellent cholesterol profile, I  will continue present  medications which include Crestor 40. Essential hypertension blood pressure seems to be well controlled however she describes episode of dizziness when she is getting up very quickly, blood pressure good today but I am more about potentially having dehydration.  I will ask her to check blood pressure at home on the regular basis she admits that she lately she does not do it and bring it to me to see what the blood pressure is anticipate need to discontinue at least cutting down her diuretics. Minimally abnormal troponin while in the hospital, no signs and symptoms of coronary artery disease I suspect it was demand ischemia related to dehydration as well as hypotension that she had when she came to the hospital.   Medication Adjustments/Labs and Tests Ordered: Current medicines are reviewed at length with the patient today.  Concerns regarding medicines are outlined above.  Orders Placed This Encounter  Procedures   LONG TERM MONITOR (3-14 DAYS)   EKG 12-Lead   No orders of the defined types were placed in this encounter.   Signed, Park Liter, MD, [facility]. 05/16/2022 4:54 PM    [place]

## 2022-05-16 NOTE — Patient Instructions (Signed)
Medication Instructions:  Your physician recommends that you continue on your current medications as directed. Please refer to the Current Medication list given to you today.  *If you need a refill on your cardiac medications before your next appointment, please call your pharmacy*   Lab Work: None Ordered If you have labs (blood work) drawn today and your tests are completely normal, you will receive your results only by: MyChart Message (if you have MyChart) OR A paper copy in the mail If you have any lab test that is abnormal or we need to change your treatment, we will call you to review the results.   Testing/Procedures:  WHY IS MY DOCTOR PRESCRIBING ZIO? The Zio system is proven and trusted by physicians to detect and diagnose irregular heart rhythms -- and has been prescribed to hundreds of thousands of patients.  The FDA has cleared the Zio system to monitor for many different kinds of irregular heart rhythms. In a study, physicians were able to reach a diagnosis 90% of the time with the Zio system1.  You can wear the Zio monitor -- a small, discreet, comfortable patch -- during your normal day-to-day activity, including while you sleep, shower, and exercise, while it records every single heartbeat for analysis.  1Barrett, P., et al. Comparison of 24 Hour Holter Monitoring Versus 14 Day Novel Adhesive Patch Electrocardiographic Monitoring. American Journal of Medicine, 2014.  ZIO VS. HOLTER MONITORING The Zio monitor can be comfortably worn for up to 14 days. Holter monitors can be worn for 24 to 48 hours, limiting the time to record any irregular heart rhythms you may have. Zio is able to capture data for the 51% of patients who have their first symptom-triggered arrhythmia after 48 hours.1  LIVE WITHOUT RESTRICTIONS The Zio ambulatory cardiac monitor is a small, unobtrusive, and water-resistant patch--you might even forget you're wearing it. The Zio monitor records and stores  every beat of your heart, whether you're sleeping, working out, or showering.     Follow-Up: At [facility], you and your health needs are our priority.  As part of our continuing mission to provide you with exceptional heart care, we have created designated Provider Care Teams.  These Care Teams include your primary Cardiologist (physician) and Advanced Practice Providers (APPs -  Physician Assistants and Nurse Practitioners) who all work together to provide you with the care you need, when you need it.  We recommend signing up for the patient portal called "MyChart".  Sign up information is provided on this After Visit Summary.  MyChart is used to connect with patients for Virtual Visits (Telemedicine).  Patients are able to view lab/test results, encounter notes, upcoming appointments, etc.  Non-urgent messages can be sent to your provider as well.   To learn more about what you can do with MyChart, go to [email or web address].    Your next appointment:   2 month(s)  The format for your next appointment:   In Person  Provider:   Robert Krasowski, MD    Other Instructions NA  

## 2022-05-22 DIAGNOSIS — H16143 Punctate keratitis, bilateral: Secondary | ICD-10-CM | POA: Diagnosis not present

## 2022-05-28 ENCOUNTER — Other Ambulatory Visit: Payer: Self-pay | Admitting: Family Medicine

## 2022-05-28 DIAGNOSIS — M1A0710 Idiopathic chronic gout, right ankle and foot, without tophus (tophi): Secondary | ICD-10-CM

## 2022-05-29 DIAGNOSIS — H16143 Punctate keratitis, bilateral: Secondary | ICD-10-CM | POA: Diagnosis not present

## 2022-05-30 ENCOUNTER — Other Ambulatory Visit: Payer: Self-pay

## 2022-05-30 MED ORDER — METOPROLOL SUCCINATE ER 100 MG PO TB24
200.0000 mg | ORAL_TABLET | Freq: Every day | ORAL | 0 refills | Status: DC
Start: 2022-05-30 — End: 2022-08-30

## 2022-05-30 NOTE — Telephone Encounter (Signed)
Received fax from [facility] requesting metoprolol 100 mg two tablets by mouth daily.  Comments:   Filled by ER MD, can you take over refills for blisterpack?     7/18 spoke w/ nurse lauren will contact Dr Tobie Poet.   Medication pended. Please advise.

## 2022-06-04 DIAGNOSIS — I480 Paroxysmal atrial fibrillation: Secondary | ICD-10-CM | POA: Diagnosis not present

## 2022-06-04 DIAGNOSIS — I700 Atherosclerosis of aorta: Secondary | ICD-10-CM | POA: Diagnosis not present

## 2022-06-05 ENCOUNTER — Encounter: Payer: Self-pay | Admitting: Family Medicine

## 2022-06-05 ENCOUNTER — Ambulatory Visit (INDEPENDENT_AMBULATORY_CARE_PROVIDER_SITE_OTHER): Payer: Medicare Other | Admitting: Family Medicine

## 2022-06-05 VITALS — BP 122/64 | HR 82 | Temp 96.5°F | Ht 63.0 in | Wt 143.0 lb

## 2022-06-05 DIAGNOSIS — E1122 Type 2 diabetes mellitus with diabetic chronic kidney disease: Secondary | ICD-10-CM

## 2022-06-05 DIAGNOSIS — E0590 Thyrotoxicosis, unspecified without thyrotoxic crisis or storm: Secondary | ICD-10-CM | POA: Diagnosis not present

## 2022-06-05 DIAGNOSIS — I480 Paroxysmal atrial fibrillation: Secondary | ICD-10-CM

## 2022-06-05 DIAGNOSIS — I129 Hypertensive chronic kidney disease with stage 1 through stage 4 chronic kidney disease, or unspecified chronic kidney disease: Secondary | ICD-10-CM | POA: Diagnosis not present

## 2022-06-05 DIAGNOSIS — N1832 Chronic kidney disease, stage 3b: Secondary | ICD-10-CM | POA: Diagnosis not present

## 2022-06-05 DIAGNOSIS — N183 Chronic kidney disease, stage 3 unspecified: Secondary | ICD-10-CM

## 2022-06-05 NOTE — Assessment & Plan Note (Signed)
Previously well controlled. Check labs.  Check microalbuminuria Continue eating healthy and exercising.

## 2022-06-05 NOTE — Assessment & Plan Note (Signed)
>>  ASSESSMENT AND PLAN FOR HYPERTENSIVE KIDNEY DISEASE WITH STAGE 3B CHRONIC KIDNEY DISEASE ([place]) WRITTEN ON 06/05/2022  1:59 PM BY COX, KIRSTEN, MD  The current medical regimen is effective;  continue present plan and medications.

## 2022-06-05 NOTE — Assessment & Plan Note (Signed)
Stable. Will continue to monitor every 4 months.

## 2022-06-05 NOTE — Assessment & Plan Note (Signed)
Check tsh, free T4 

## 2022-06-05 NOTE — Progress Notes (Signed)
Subjective:  Patient ID: Pamela Schwartz, female    DOB: Jul 28, 1941  Age: 81 y.o. MRN: [id number]  Chief Complaint  Patient presents with   Atrial Fibrillation    Atrial Fibrillation Symptoms are negative for chest pain, dizziness and shortness of breath. Past medical history includes atrial fibrillation.    Patient is a 81 year old white female with past medical history for diabetes, hyperlipidemia, chronic kidney disease, hypertension, and recently atrial fibrillation.  Patient was hospitalized for severe dehydration and acute gastroenteritis and developed atrial fibrillation.  She is seeing Dr. Agustin Cree since being discharged.  She is currently only on aspirin 81 mg daily.  He ordered a Holter monitor which she returned last week.  I reviewed this with her today which appears to be essentially normal.  She has a few runs of SVT but no atrial fibrillation.  I told her the final read, from cardiology.  Patient is feeling much better.  She is due for an A1c today.  Her diabetes has been very well controlled.  Her last A1c was 6.2.  Her last cholesterol panel was 3 months ago and was at goal.  Last creatinine was 1.99.  She did see nephrology.  She would like to continue to monitor with me rather than go back to them unless something worsens.  Current Outpatient Medications on File Prior to Visit  Medication Sig Dispense Refill   allopurinol (ZYLOPRIM) 100 MG tablet TAKE ONE TABLET BY MOUTH EVERY EVENING 90 tablet 1   amLODipine (NORVASC) 10 MG tablet TAKE ONE TABLET BY MOUTH EVERY EVENING (Patient taking differently: Take 10 mg by mouth daily.) 90 tablet 3   aspirin 81 MG tablet Take 81 mg by mouth daily.     chlorthalidone (HYGROTON) 25 MG tablet TAKE ONE TABLET BY MOUTH EVERY DAY (Patient taking differently: Take 25 mg by mouth daily.) 90 tablet 1   colchicine 0.6 MG tablet Take 1 TABLET by mouth in the morning and at bedtime. X 1 week for gout flare. (Patient taking differently: Take 0.6 mg  by mouth 2 (two) times daily.) 14 tablet 2   lisinopril (ZESTRIL) 20 MG tablet Take 1 tablet (20 mg total) by mouth daily. 90 tablet 1   methimazole (TAPAZOLE) 5 MG tablet Take 1 tablet (5 mg total) by mouth 3 (three) times daily. 90 tablet 2   metoprolol succinate (TOPROL-XL) 100 MG 24 hr tablet Take 2 tablets (200 mg total) by mouth daily. Take with or immediately following a meal. 180 tablet 0   ondansetron (ZOFRAN-ODT) 8 MG disintegrating tablet Take 1 tablet (8 mg total) by mouth every 8 (eight) hours as needed for nausea or vomiting. 30 tablet 0   pantoprazole (PROTONIX) 40 MG tablet TAKE ONE TABLET BY MOUTH EVERY EVENING (Patient taking differently: Take 40 mg by mouth daily.) 90 tablet 3   promethazine (PHENERGAN) 25 MG tablet take 1 tablet (25 mg) by oral route 4 times per day AS NEEDED nausea/vomiting] (Patient taking differently: Take 25 mg by mouth every 6 (six) hours as needed for nausea or vomiting. take 1 tablet (25 mg) by oral route 4 times per day AS NEEDED nausea/vomiting]) 60 tablet 3   rosuvastatin (CRESTOR) 40 MG tablet TAKE ONE TABLET BY MOUTH EVERY EVENING (Patient taking differently: Take 40 mg by mouth daily.) 90 tablet 1   traMADol (ULTRAM) 50 MG tablet TAKE ONE TABLET BY MOUTH EVERY 8 HOURS AS NEEDED FOR BACK PAIN (Patient taking differently: Take 50 mg by mouth every 8 (eight)  hours as needed for moderate pain or severe pain. TAKE ONE TABLET BY MOUTH EVERY 8 HOURS AS NEEDED FOR BACK PAIN) 90 tablet 2   VASCEPA 1 g capsule TAKE 2 CAPSULES BY MOUTH TWICE DAILY (Patient taking differently: Take 2 g by mouth 2 (two) times daily.) 360 capsule 1   zolpidem (AMBIEN CR) 12.5 MG CR tablet TAKE ONE TABLET BY MOUTH AT BEDTIME (Patient taking differently: Take 12.5 mg by mouth at bedtime as needed for sleep.) 30 tablet 5   Multiple Vitamins-Minerals (PRESERVISION AREDS 2 PO) Take 1 tablet by mouth daily.     No current facility-administered medications on file prior to visit.   Past  Medical History:  Diagnosis Date   Age-related osteoporosis without current pathological fracture    Arthritis    Barrett's esophagus without dysplasia    Cancer (HCC)    denies   Chronic kidney disease    Closed fracture of maxillary sinus (HCC) 07/23/2021   Colitis 05/25/2020   Colitis 05/25/2020   GERD (gastroesophageal reflux disease)    Hearing loss    History of COVID-19    Hypercholesterolemia    Hypertension    Hypertensive chronic kidney disease with stage 1 through stage 4 chronic kidney disease, or unspecified chronic kidney disease    IBS (irritable bowel syndrome)    Impaired fasting glucose    Low back pain    Mixed hyperlipidemia    Paresthesia of skin    Primary insomnia    Sacroiliac inflammation (HCC) 03/02/2020   Scars    Trochanteric bursitis of left hip 09/29/2020   Vitamin D deficiency, unspecified    Past Surgical History:  Procedure Laterality Date   ANTERIOR CERVICAL DECOMP/DISCECTOMY FUSION N/A 03/06/2016   Procedure: ACDF - C5-C6 - C6-C7  ;  Surgeon: Julio Sicks, MD;  Location: [facility];  Service: Neurosurgery;  Laterality: N/A;  ACDF - C5-C6 - C6-C7     APPENDECTOMY     BUNIONECTOMY     right and left   CARDIAC CATHETERIZATION     had in [place] around 2012   CATARACT EXTRACTION W/ INTRAOCULAR LENS  IMPLANT, BILATERAL     CESAREAN SECTION     COLONOSCOPY W/ BIOPSIES AND POLYPECTOMY     MULTIPLE TOOTH EXTRACTIONS     TUBAL LIGATION      Family History  Problem Relation Age of Onset   Heart attack Mother    Heart attack Father    Hypertension Sister    Hypertension Brother    Renal Disease Brother    Heart attack Brother    Multiple myeloma Brother    Hypertension Sister    Hypertension Sister    Heart attack Sister    Social History   Socioeconomic History   Marital status: Married    Spouse name: Not on file   Number of children: 3   Years of education: Not on file   Highest education level: Not on file  Occupational History    Occupation: Retired [employer] fundraiser  Tobacco Use   Smoking status: Former    Packs/day: 0.50    Types: Cigarettes    Quit date: 10/02/2015    Years since quitting: 6.6   Smokeless tobacco: Never  Vaping Use   Vaping Use: Never used  Substance and Sexual Activity   Alcohol use: No   Drug use: No   Sexual activity: Not on file  Other Topics Concern   Not on file  Social History Narrative  Not on file   Social Determinants of Health   Financial Resource Strain: Not on file  Food Insecurity: No Food Insecurity (03/21/2022)   Hunger Vital Sign    Worried About Running Out of Food in the Last Year: Never true    Ran Out of Food in the Last Year: Never true  Transportation Needs: No Transportation Needs (03/21/2022)   PRAPARE - Hydrologist (Medical): No    Lack of Transportation (Non-Medical): No  Physical Activity: Insufficiently Active (03/21/2022)   Exercise Vital Sign    Days of Exercise per Week: 3 days    Minutes of Exercise per Session: 30 min  Stress: No Stress Concern Present (03/21/2022)   [place]    Feeling of Stress : Not at all  Social Connections: Not on file    Review of Systems  Constitutional:  Negative for chills, fatigue and fever.  HENT:  Negative for congestion, ear pain and sore throat.   Respiratory:  Negative for cough and shortness of breath.   Cardiovascular:  Negative for chest pain.  Gastrointestinal:  Negative for abdominal pain, constipation, diarrhea, nausea and vomiting.  Genitourinary:  Negative for dysuria and urgency.  Musculoskeletal:  Negative for arthralgias and myalgias.  Skin:  Negative for rash.  Neurological:  Negative for dizziness and headaches.  Psychiatric/Behavioral:  Negative for dysphoric mood. The patient is not nervous/anxious.      Objective:  BP 122/64   Pulse 82   Temp (!) 96.5 F (35.8 C)   Ht $R'5\' 3"'Jw$  (1.6 m)   Wt 143 lb (64.9  kg)   SpO2 95%   BMI 25.33 kg/m      06/05/2022    1:24 PM 05/16/2022    3:55 PM 04/24/2022   10:58 AM  BP/Weight  Systolic BP [phone]  Diastolic BP 64 70 60  Wt. (Lbs) 143 142.4 136.2  BMI 25.33 kg/m2 25.23 kg/m2 24.13 kg/m2    Physical Exam Vitals reviewed.  Constitutional:      Appearance: Normal appearance. She is normal weight.  Neck:     Vascular: No carotid bruit.  Cardiovascular:     Rate and Rhythm: Normal rate and regular rhythm.     Heart sounds: Normal heart sounds.  Pulmonary:     Effort: Pulmonary effort is normal. No respiratory distress.     Breath sounds: Normal breath sounds.  Abdominal:     General: Abdomen is flat. Bowel sounds are normal.     Palpations: Abdomen is soft.     Tenderness: There is no abdominal tenderness.  Neurological:     Mental Status: She is alert and oriented to person, place, and time.  Psychiatric:        Mood and Affect: Mood normal.        Behavior: Behavior normal.     Diabetic Foot Exam - Simple   No data filed      Lab Results  Component Value Date   WBC 8.4 04/24/2022   HGB 14.8 04/24/2022   HCT 42.8 04/24/2022   PLT 213 04/24/2022   GLUCOSE 93 04/24/2022   CHOL 126 02/27/2022   TRIG 100 02/27/2022   HDL 61 02/27/2022   LDLCALC 47 02/27/2022   ALT 30 04/24/2022   AST 29 04/24/2022   NA 145 (H) 04/24/2022   K 4.0 04/24/2022   CL 105 04/24/2022   CREATININE 1.99 (H) 04/24/2022   BUN  25 04/24/2022   CO2 24 04/24/2022   TSH 0.112 (L) 02/27/2022   HGBA1C 6.0 (H) 02/27/2022   MICROALBUR 150 10/17/2021      Assessment & Plan:   Problem List Items Addressed This Visit       Cardiovascular and Mediastinum   Paroxysmal atrial fibrillation [facility]) - Primary    Cardiology feels this may have been due to her dehydration and illness while she is in the hospital.  Will defer to them as to whether or not she needs to be on a NOAC        Endocrine   Diabetes mellitus with stage 3 chronic kidney disease  ([place])    Previously well controlled. Check labs.  Check microalbuminuria Continue eating healthy and exercising.      Relevant Orders   Hemoglobin A1c   CBC with Differential/Platelet   Comprehensive metabolic panel   Microalbumin / creatinine urine ratio   Hyperthyroidism    Check tsh, free T4.      Relevant Orders   T4, free   TSH     Genitourinary   Stage 3b chronic kidney disease ([place])    Stable. Will continue to monitor every 4 months.       Hypertensive kidney disease with stage 3b chronic kidney disease ([place])    The current medical regimen is effective;  continue present plan and medications.      .  No orders of the defined types were placed in this encounter.   Orders Placed This Encounter  Procedures   Hemoglobin A1c   CBC with Differential/Platelet   Comprehensive metabolic panel   T4, free   TSH   Microalbumin / creatinine urine ratio     Follow-up: Return in about 4 months (around 10/06/2022) for chronic fasting.  An After Visit Summary was printed and given to the patient.  Rochel Brome, MD [facility] [phone]

## 2022-06-05 NOTE — Assessment & Plan Note (Signed)
The current medical regimen is effective;  continue present plan and medications.  

## 2022-06-05 NOTE — Assessment & Plan Note (Signed)
Cardiology feels this may have been due to her dehydration and illness while she is in the hospital.  Will defer to them as to whether or not she needs to be on a NOAC

## 2022-06-06 ENCOUNTER — Other Ambulatory Visit: Payer: Self-pay | Admitting: Family Medicine

## 2022-06-06 LAB — CBC WITH DIFFERENTIAL/PLATELET
Basophils Absolute: 0 10*3/uL (ref 0.0–0.2)
Basos: 0 %
EOS (ABSOLUTE): 0.5 10*3/uL — ABNORMAL HIGH (ref 0.0–0.4)
Eos: 4 %
Hematocrit: 42.5 % (ref 34.0–46.6)
Hemoglobin: 14.5 g/dL (ref 11.1–15.9)
Immature Grans (Abs): 0 10*3/uL (ref 0.0–0.1)
Immature Granulocytes: 0 %
Lymphocytes Absolute: 2 10*3/uL (ref 0.7–3.1)
Lymphs: 18 %
MCH: 31.2 pg (ref 26.6–33.0)
MCHC: 34.1 g/dL (ref 31.5–35.7)
MCV: 91 fL (ref 79–97)
Monocytes Absolute: 1 10*3/uL — ABNORMAL HIGH (ref 0.1–0.9)
Monocytes: 9 %
Neutrophils Absolute: 7.6 10*3/uL — ABNORMAL HIGH (ref 1.4–7.0)
Neutrophils: 69 %
Platelets: 187 10*3/uL (ref 150–450)
RBC: 4.65 x10E6/uL (ref 3.77–5.28)
RDW: 13.9 % (ref 11.7–15.4)
WBC: 11.1 10*3/uL — ABNORMAL HIGH (ref 3.4–10.8)

## 2022-06-06 LAB — COMPREHENSIVE METABOLIC PANEL
ALT: 27 IU/L (ref 0–32)
AST: 23 IU/L (ref 0–40)
Albumin/Globulin Ratio: 1.8 (ref 1.2–2.2)
Albumin: 4.2 g/dL (ref 3.8–4.8)
Alkaline Phosphatase: 117 IU/L (ref 44–121)
BUN/Creatinine Ratio: 13 (ref 12–28)
BUN: 25 mg/dL (ref 8–27)
Bilirubin Total: 0.3 mg/dL (ref 0.0–1.2)
CO2: 25 mmol/L (ref 20–29)
Calcium: 9.5 mg/dL (ref 8.7–10.3)
Chloride: 102 mmol/L (ref 96–106)
Creatinine, Ser: 1.89 mg/dL — ABNORMAL HIGH (ref 0.57–1.00)
Globulin, Total: 2.4 g/dL (ref 1.5–4.5)
Glucose: 102 mg/dL — ABNORMAL HIGH (ref 70–99)
Potassium: 4.7 mmol/L (ref 3.5–5.2)
Sodium: 142 mmol/L (ref 134–144)
Total Protein: 6.6 g/dL (ref 6.0–8.5)
eGFR: 27 mL/min/{1.73_m2} — ABNORMAL LOW (ref >59)

## 2022-06-06 LAB — HEMOGLOBIN A1C
Est. average glucose Bld gHb Est-mCnc: 117 mg/dL
Hgb A1c MFr Bld: 5.7 % — ABNORMAL HIGH (ref 4.8–5.6)

## 2022-06-06 LAB — TSH: TSH: 2.9 u[IU]/mL (ref 0.450–4.500)

## 2022-06-06 LAB — T4, FREE: Free T4: 0.74 ng/dL — ABNORMAL LOW (ref 0.82–1.77)

## 2022-06-08 ENCOUNTER — Telehealth: Payer: Self-pay

## 2022-06-08 NOTE — Telephone Encounter (Signed)
Patient notified of results.

## 2022-06-08 NOTE — Telephone Encounter (Signed)
-----   Message from Park Liter, MD sent at 06/07/2022  9:44 AM EDT ----- Nothing alarming on the monitor

## 2022-06-12 ENCOUNTER — Other Ambulatory Visit: Payer: Self-pay | Admitting: Family Medicine

## 2022-06-12 DIAGNOSIS — H16143 Punctate keratitis, bilateral: Secondary | ICD-10-CM | POA: Diagnosis not present

## 2022-06-12 DIAGNOSIS — E0590 Thyrotoxicosis, unspecified without thyrotoxic crisis or storm: Secondary | ICD-10-CM

## 2022-06-13 ENCOUNTER — Other Ambulatory Visit: Payer: Medicare Other

## 2022-06-13 ENCOUNTER — Other Ambulatory Visit: Payer: Self-pay

## 2022-06-13 DIAGNOSIS — E0590 Thyrotoxicosis, unspecified without thyrotoxic crisis or storm: Secondary | ICD-10-CM | POA: Diagnosis not present

## 2022-06-14 ENCOUNTER — Other Ambulatory Visit: Payer: Self-pay | Admitting: Family Medicine

## 2022-06-14 LAB — THYROTROPIN RECEPTOR AUTOABS: Thyrotropin Receptor Ab: <1.1 IU/L (ref 0.00–1.75)

## 2022-06-22 ENCOUNTER — Other Ambulatory Visit: Payer: Self-pay

## 2022-06-22 DIAGNOSIS — E0590 Thyrotoxicosis, unspecified without thyrotoxic crisis or storm: Secondary | ICD-10-CM

## 2022-06-25 ENCOUNTER — Telehealth: Payer: Self-pay | Admitting: Family Medicine

## 2022-06-25 NOTE — Telephone Encounter (Signed)
   Pamela Schwartz has been scheduled for the following appointment:  WHAT: THYROID UPTAKE WHERE: [place] OUTPATIENT DATE: 06/27/22 & 06/28/22 TIME: 1ST DAY CHECK-IN @ 10:00 AM, 2ND DAY CHECK-IN @ 10:30 AM  Patient has been made aware.

## 2022-06-27 DIAGNOSIS — E0590 Thyrotoxicosis, unspecified without thyrotoxic crisis or storm: Secondary | ICD-10-CM | POA: Diagnosis not present

## 2022-06-28 DIAGNOSIS — R1310 Dysphagia, unspecified: Secondary | ICD-10-CM | POA: Diagnosis not present

## 2022-06-28 DIAGNOSIS — R000 Tachycardia, unspecified: Secondary | ICD-10-CM | POA: Diagnosis not present

## 2022-06-28 DIAGNOSIS — E0500 Thyrotoxicosis with diffuse goiter without thyrotoxic crisis or storm: Secondary | ICD-10-CM | POA: Diagnosis not present

## 2022-06-28 DIAGNOSIS — E041 Nontoxic single thyroid nodule: Secondary | ICD-10-CM | POA: Diagnosis not present

## 2022-07-02 ENCOUNTER — Other Ambulatory Visit: Payer: Self-pay

## 2022-07-02 DIAGNOSIS — E0590 Thyrotoxicosis, unspecified without thyrotoxic crisis or storm: Secondary | ICD-10-CM

## 2022-07-03 ENCOUNTER — Other Ambulatory Visit: Payer: Self-pay

## 2022-07-03 ENCOUNTER — Ambulatory Visit: Payer: Medicare Other | Admitting: Family Medicine

## 2022-07-03 ENCOUNTER — Telehealth: Payer: Self-pay

## 2022-07-03 ENCOUNTER — Other Ambulatory Visit: Payer: Self-pay | Admitting: Family Medicine

## 2022-07-03 DIAGNOSIS — E041 Nontoxic single thyroid nodule: Secondary | ICD-10-CM

## 2022-07-03 DIAGNOSIS — E0590 Thyrotoxicosis, unspecified without thyrotoxic crisis or storm: Secondary | ICD-10-CM

## 2022-07-03 NOTE — Telephone Encounter (Signed)
[facility] called about any dosage change in the methimazole.  Dr. Tobie Poet advised the patient to remain off of medication until we get the ultrasound results.  Patient was notified.

## 2022-07-05 DIAGNOSIS — E041 Nontoxic single thyroid nodule: Secondary | ICD-10-CM | POA: Diagnosis not present

## 2022-07-09 ENCOUNTER — Other Ambulatory Visit: Payer: Self-pay | Admitting: Family Medicine

## 2022-07-09 DIAGNOSIS — E041 Nontoxic single thyroid nodule: Secondary | ICD-10-CM

## 2022-07-10 DIAGNOSIS — H16143 Punctate keratitis, bilateral: Secondary | ICD-10-CM | POA: Diagnosis not present

## 2022-07-10 DIAGNOSIS — H524 Presbyopia: Secondary | ICD-10-CM | POA: Diagnosis not present

## 2022-07-11 ENCOUNTER — Other Ambulatory Visit: Payer: Self-pay | Admitting: Family Medicine

## 2022-07-11 DIAGNOSIS — E0590 Thyrotoxicosis, unspecified without thyrotoxic crisis or storm: Secondary | ICD-10-CM

## 2022-07-18 DIAGNOSIS — E041 Nontoxic single thyroid nodule: Secondary | ICD-10-CM | POA: Diagnosis not present

## 2022-07-18 DIAGNOSIS — E042 Nontoxic multinodular goiter: Secondary | ICD-10-CM | POA: Diagnosis not present

## 2022-07-18 DIAGNOSIS — E079 Disorder of thyroid, unspecified: Secondary | ICD-10-CM | POA: Diagnosis not present

## 2022-07-19 ENCOUNTER — Other Ambulatory Visit: Payer: Self-pay

## 2022-07-19 DIAGNOSIS — E041 Nontoxic single thyroid nodule: Secondary | ICD-10-CM

## 2022-07-20 ENCOUNTER — Other Ambulatory Visit: Payer: Self-pay | Admitting: Family Medicine

## 2022-07-23 ENCOUNTER — Encounter: Payer: Self-pay | Admitting: Nurse Practitioner

## 2022-07-23 ENCOUNTER — Ambulatory Visit (INDEPENDENT_AMBULATORY_CARE_PROVIDER_SITE_OTHER): Payer: Medicare Other | Admitting: Nurse Practitioner

## 2022-07-23 VITALS — BP 118/64 | HR 73 | Temp 97.1°F | Ht 63.0 in | Wt 148.0 lb

## 2022-07-23 DIAGNOSIS — M4802 Spinal stenosis, cervical region: Secondary | ICD-10-CM | POA: Diagnosis not present

## 2022-07-23 DIAGNOSIS — M542 Cervicalgia: Secondary | ICD-10-CM | POA: Diagnosis not present

## 2022-07-23 MED ORDER — TRIAMCINOLONE ACETONIDE 40 MG/ML IJ SUSP
60.0000 mg | Freq: Once | INTRAMUSCULAR | Status: AC
  Administered 2022-07-23: 60 mg via INTRAMUSCULAR

## 2022-07-23 NOTE — Patient Instructions (Addendum)
Kenalog 60 mg injection given in office Take Tylenol arthritis as directed Alternate heat/ice to neck as needed Take muscle relaxants as prescribed Use topical rub such as Voltaren gel to neck as directed Perform neck exercises as tolerated   Cervical Strain and Sprain Rehab Ask your health care provider which exercises are safe for you. Do exercises exactly as told by your health care provider and adjust them as directed. It is normal to feel mild stretching, pulling, tightness, or discomfort as you do these exercises. Stop right away if you feel sudden pain or your pain gets worse. Do not begin these exercises until told by your health care provider. Stretching and range-of-motion exercises Cervical side bending  Using good posture, sit on a stable chair or stand up. Without moving your shoulders, slowly tilt your left / right ear to your shoulder until you feel a stretch in the opposite side neck muscles. You should be looking straight ahead. Hold for __________ seconds. Repeat with the other side of your neck. Repeat __________ times. Complete this exercise __________ times a day. Cervical rotation  Using good posture, sit on a stable chair or stand up. Slowly turn your head to the side as if you are looking over your left / right shoulder. Keep your eyes level with the ground. Stop when you feel a stretch along the side and the back of your neck. Hold for __________ seconds. Repeat this by turning to your other side. Repeat __________ times. Complete this exercise __________ times a day. Thoracic extension and pectoral stretch  Roll a towel or a small blanket so it is about 4 inches (10 cm) in diameter. Lie down on your back on a firm surface. Put the towel in the middle of your back across your spine. It should not be under your shoulder blades. Put your hands behind your head and let your elbows fall out to your sides. Hold for __________ seconds. Repeat __________ times.  Complete this exercise __________ times a day. Strengthening exercises Isometric upper cervical flexion  Lie on your back with a thin pillow behind your head and a small rolled-up towel under your neck. Gently tuck your chin toward your chest and nod your head down to look toward your feet. Do not lift your head off the pillow. Hold for __________ seconds. Release the tension slowly. Relax your neck muscles completely before you repeat this exercise. Repeat __________ times. Complete this exercise __________ times a day. Isometric cervical extension  Stand about 6 inches (15 cm) away from a wall, with your back facing the wall. Place a soft object, about 6-8 inches (15-20 cm) in diameter, between the back of your head and the wall. A soft object could be a small pillow, a ball, or a folded towel. Gently tilt your head back and press into the soft object. Keep your jaw and forehead relaxed. Hold for __________ seconds. Release the tension slowly. Relax your neck muscles completely before you repeat this exercise. Repeat __________ times. Complete this exercise __________ times a day. Posture and body mechanics Body mechanics refers to the movements and positions of your body while you do your daily activities. Posture is part of body mechanics. Good posture and healthy body mechanics can help to relieve stress in your body's tissues and joints. Good posture means that your spine is in its natural S-curve position (your spine is neutral), your shoulders are pulled back slightly, and your head is not tipped forward. The following are general guidelines for  applying improved posture and body mechanics to your everyday activities. Sitting  When sitting, keep your spine neutral and keep your feet flat on the floor. Use a footrest, if necessary, and keep your thighs parallel to the floor. Avoid rounding your shoulders, and avoid tilting your head forward. When working at a desk or a computer, keep  your desk at a height where your hands are slightly lower than your elbows. Slide your chair under your desk so you are close enough to maintain good posture. When working at a computer, place your monitor at a height where you are looking straight ahead and you do not have to tilt your head forward or downward to look at the screen. Standing  When standing, keep your spine neutral and keep your feet about hip-width apart. Keep a slight bend in your knees. Your ears, shoulders, and hips should line up. When you do a task in which you stand in one place for a long time, place one foot up on a stable object that is 2-4 inches (5-10 cm) high, such as a footstool. This helps keep your spine neutral. Resting When lying down and resting, avoid positions that are most painful for you. Try to support your neck in a neutral position. You can use a contour pillow or a small rolled-up towel. Your pillow should support your neck but not push on it. This information is not intended to replace advice given to you by your health care provider. Make sure you discuss any questions you have with your health care provider. Document Revised: 09/18/2021 Document Reviewed: 09/18/2021 Elsevier Patient Education  [place].

## 2022-07-23 NOTE — Progress Notes (Signed)
Acute Office Visit  Subjective:    Patient ID: Pamela Schwartz, female    DOB: 1941/07/23, 81 y.o.   MRN: [id number]  CC: Neck pain  HPI: Patient is in today for Pain  She reports new onset neck pain. was not an injury that may have caused the pain. The pain started yesterday and is worsening. The pain does not radiate. Denies radiculopathy, fever, or headache. The pain is described as dull, is moderate in intensity, occurring constantly. Aggravating factors: flexion, extension, rotating left/right She has tried Tramadol,prescription muscle relaxants, and applying heat  with no relief. History of cervical spinal stenosis that required surgery in 2017.   Past Medical History:  Diagnosis Date   Age-related osteoporosis without current pathological fracture    Arthritis    Barrett's esophagus without dysplasia    Cancer ([place])    denies   Chronic kidney disease    Closed fracture of maxillary sinus ([place]) 07/23/2021   Colitis 05/25/2020   Colitis 05/25/2020   GERD (gastroesophageal reflux disease)    Hearing loss    History of COVID-19    Hypercholesterolemia    Hypertension    Hypertensive chronic kidney disease with stage 1 through stage 4 chronic kidney disease, or unspecified chronic kidney disease    IBS (irritable bowel syndrome)    Impaired fasting glucose    Low back pain    Mixed hyperlipidemia    Paresthesia of skin    Primary insomnia    Sacroiliac inflammation ([place]) 03/02/2020   Scars    Trochanteric bursitis of left hip 09/29/2020   Vitamin D deficiency, unspecified     Past Surgical History:  Procedure Laterality Date   ANTERIOR CERVICAL DECOMP/DISCECTOMY FUSION N/A 03/06/2016   Procedure: ACDF - C5-C6 - C6-C7  ;  Surgeon: Earnie Larsson, MD;  Location: [facility];  Service: Neurosurgery;  Laterality: N/A;  ACDF - C5-C6 - C6-C7     APPENDECTOMY     BUNIONECTOMY     right and left   CARDIAC CATHETERIZATION     had in [place] around 2012   CATARACT EXTRACTION W/  INTRAOCULAR LENS  IMPLANT, BILATERAL     CESAREAN SECTION     COLONOSCOPY W/ BIOPSIES AND POLYPECTOMY     MULTIPLE TOOTH EXTRACTIONS     TUBAL LIGATION      Family History  Problem Relation Age of Onset   Heart attack Mother    Heart attack Father    Hypertension Sister    Hypertension Brother    Renal Disease Brother    Heart attack Brother    Multiple myeloma Brother    Hypertension Sister    Hypertension Sister    Heart attack Sister     Social History   Socioeconomic History   Marital status: Married    Spouse name: Not on file   Number of children: 3   Years of education: Not on file   Highest education level: Not on file  Occupational History   Occupation: Retired Therapist, sports  Tobacco Use   Smoking status: Former    Packs/day: 0.50    Types: Cigarettes    Quit date: 10/02/2015    Years since quitting: 6.8   Smokeless tobacco: Never  Vaping Use   Vaping Use: Never used  Substance and Sexual Activity   Alcohol use: No   Drug use: No   Sexual activity: Not on file  Other Topics Concern   Not on file  Social History Narrative  Not on file   Social Determinants of Health   Financial Resource Strain: Not on file  Food Insecurity: No Food Insecurity (03/21/2022)   Hunger Vital Sign    Worried About Running Out of Food in the Last Year: Never true    Ran Out of Food in the Last Year: Never true  Transportation Needs: No Transportation Needs (03/21/2022)   PRAPARE - Hydrologist (Medical): No    Lack of Transportation (Non-Medical): No  Physical Activity: Insufficiently Active (03/21/2022)   Exercise Vital Sign    Days of Exercise per Week: 3 days    Minutes of Exercise per Session: 30 min  Stress: No Stress Concern Present (03/21/2022)   [place]    Feeling of Stress : Not at all  Social Connections: Not on file  Intimate Partner Violence: Not At Risk (03/21/2022)    Humiliation, Afraid, Rape, and Kick questionnaire    Fear of Current or Ex-Partner: No    Emotionally Abused: No    Physically Abused: No    Sexually Abused: No    Outpatient Medications Prior to Visit  Medication Sig Dispense Refill   traMADol (ULTRAM) 50 MG tablet Take 1 tablet (50 mg total) by mouth every 8 (eight) hours as needed for moderate pain or severe pain. TAKE ONE TABLET BY MOUTH EVERY 8 HOURS AS NEEDED FOR BACK PAIN 90 tablet 1   allopurinol (ZYLOPRIM) 100 MG tablet TAKE ONE TABLET BY MOUTH EVERY EVENING 90 tablet 1   amLODipine (NORVASC) 10 MG tablet TAKE ONE TABLET BY MOUTH EVERY EVENING (Patient taking differently: Take 10 mg by mouth daily.) 90 tablet 3   aspirin 81 MG tablet Take 81 mg by mouth daily.     chlorthalidone (HYGROTON) 25 MG tablet TAKE ONE TABLET BY MOUTH EVERY DAY (Patient taking differently: Take 25 mg by mouth daily.) 90 tablet 1   colchicine 0.6 MG tablet Take 1 TABLET by mouth in the morning and at bedtime. X 1 week for gout flare. (Patient taking differently: Take 0.6 mg by mouth 2 (two) times daily.) 14 tablet 2   lisinopril (ZESTRIL) 20 MG tablet Take 1 tablet (20 mg total) by mouth daily. 90 tablet 1   methimazole (TAPAZOLE) 5 MG tablet TAKE ONE TABLET BY MOUTH 3 TIMES DAILY 90 tablet 1   metoprolol succinate (TOPROL-XL) 100 MG 24 hr tablet Take 2 tablets (200 mg total) by mouth daily. Take with or immediately following a meal. 180 tablet 0   ondansetron (ZOFRAN-ODT) 8 MG disintegrating tablet Take 1 tablet (8 mg total) by mouth every 8 (eight) hours as needed for nausea or vomiting. 30 tablet 0   pantoprazole (PROTONIX) 40 MG tablet TAKE ONE TABLET BY MOUTH EVERY EVENING (Patient taking differently: Take 40 mg by mouth daily.) 90 tablet 3   promethazine (PHENERGAN) 25 MG tablet take 1 tablet (25 mg) by oral route 4 times per day AS NEEDED nausea/vomiting] 60 tablet 3   rosuvastatin (CRESTOR) 40 MG tablet TAKE ONE TABLET BY MOUTH EVERY EVENING 90 tablet 1    VASCEPA 1 g capsule TAKE 2 CAPSULES BY MOUTH EVERY DAY and TAKE 2 CAPSULES BY MOUTH EVERY EVENING 360 capsule 1   zolpidem (AMBIEN CR) 12.5 MG CR tablet TAKE ONE TABLET BY MOUTH AT BEDTIME (Patient taking differently: Take 12.5 mg by mouth at bedtime as needed for sleep.) 30 tablet 5   No facility-administered medications prior to visit.  Allergies  Allergen Reactions   Clonidine Derivatives Other (See Comments)   Dexilant [Dexlansoprazole] Diarrhea   Hydralazine    Nickel    Tape Other (See Comments)    adhesive adhesive   Tizanidine Hcl Other (See Comments)    Somnolence   Zantac [Ranitidine Hcl]    Chantix [Varenicline] Rash    Review of Systems    See pertinent positives and negatives per HPI.  Objective:    Physical Exam Vitals reviewed.  Constitutional:      Appearance: She is ill-appearing (facial grimacing, guarding behavior).  Musculoskeletal:        General: Tenderness present.     Cervical back: Tenderness present. No edema or erythema. Pain with movement and muscular tenderness present. No spinous process tenderness. Decreased range of motion.  Skin:    General: Skin is warm and dry.     Capillary Refill: Capillary refill takes less than 2 seconds.  Neurological:     General: No focal deficit present.     Mental Status: She is alert and oriented to person, place, and time.     BP 118/64   Pulse 73   Temp (!) 97.1 F (36.2 C)   Ht $R'5\' 3"'ig$  (1.6 m)   Wt 148 lb (67.1 kg)   SpO2 97%   BMI 26.22 kg/m   Wt Readings from Last 3 Encounters:  06/05/22 143 lb (64.9 kg)  05/16/22 142 lb 6.4 oz (64.6 kg)  04/24/22 136 lb 3.2 oz (61.8 kg)    Health Maintenance Due  Topic Date Due   Hepatitis C Screening  Never done   COVID-19 Vaccine (4 - Moderna series) 05/02/2021   OPHTHALMOLOGY EXAM  01/25/2022   INFLUENZA VACCINE  06/12/2022   Lab Results  Component Value Date   TSH 2.900 06/05/2022   Lab Results  Component Value Date   WBC 11.1 (H)  06/05/2022   HGB 14.5 06/05/2022   HCT 42.5 06/05/2022   MCV 91 06/05/2022   PLT 187 06/05/2022   Lab Results  Component Value Date   NA 142 06/05/2022   K 4.7 06/05/2022   CO2 25 06/05/2022   GLUCOSE 102 (H) 06/05/2022   BUN 25 06/05/2022   CREATININE 1.89 (H) 06/05/2022   BILITOT 0.3 06/05/2022   ALKPHOS 117 06/05/2022   AST 23 06/05/2022   ALT 27 06/05/2022   PROT 6.6 06/05/2022   ALBUMIN 4.2 06/05/2022   CALCIUM 9.5 06/05/2022   ANIONGAP 12 03/06/2016   EGFR 27 (L) 06/05/2022   Lab Results  Component Value Date   CHOL 126 02/27/2022   Lab Results  Component Value Date   HDL 61 02/27/2022   Lab Results  Component Value Date   LDLCALC 47 02/27/2022   Lab Results  Component Value Date   TRIG 100 02/27/2022   Lab Results  Component Value Date   CHOLHDL 2.1 02/27/2022   Lab Results  Component Value Date   HGBA1C 5.7 (H) 06/05/2022       Assessment & Plan:   1. Cervical pain (neck) - triamcinolone acetonide (KENALOG-40) injection 60 mg  2. Cervical spinal stenosis - triamcinolone acetonide (KENALOG-40) injection 60 mg      Kenalog 60 mg injection given in office Take Tylenol arthritis as directed Alternate heat/ice to neck as needed Take muscle relaxants as prescribed Use topical rub such as Voltaren gel to neck as directed Perform neck exercises as tolerated   Follow-up: PRN  An After Visit Summary was printed and  given to the patient.  I, Rip Harbour, NP, have reviewed all documentation for this visit. The documentation on 07/23/22 for the exam, diagnosis, procedures, and orders are all accurate and complete.    Signed, Rip Harbour, NP [place] [phone]

## 2022-07-24 ENCOUNTER — Other Ambulatory Visit: Payer: Self-pay | Admitting: Family Medicine

## 2022-07-24 ENCOUNTER — Ambulatory Visit: Payer: Medicare Other | Admitting: Cardiology

## 2022-07-24 DIAGNOSIS — D440 Neoplasm of uncertain behavior of thyroid gland: Secondary | ICD-10-CM | POA: Diagnosis not present

## 2022-07-24 DIAGNOSIS — E0590 Thyrotoxicosis, unspecified without thyrotoxic crisis or storm: Secondary | ICD-10-CM

## 2022-07-25 ENCOUNTER — Other Ambulatory Visit: Payer: Self-pay | Admitting: Family Medicine

## 2022-07-30 ENCOUNTER — Other Ambulatory Visit: Payer: Self-pay

## 2022-07-30 ENCOUNTER — Other Ambulatory Visit: Payer: Self-pay | Admitting: Family Medicine

## 2022-07-30 MED ORDER — ZOLPIDEM TARTRATE ER 6.25 MG PO TBCR: 12.5000 mg | EXTENDED_RELEASE_TABLET | Freq: Every evening | ORAL | 2 refills | Status: DC | PRN

## 2022-08-01 ENCOUNTER — Ambulatory Visit: Payer: Medicare Other | Admitting: Cardiology

## 2022-08-08 ENCOUNTER — Ambulatory Visit: Payer: Medicare Other | Attending: Cardiology | Admitting: Cardiology

## 2022-08-08 ENCOUNTER — Encounter: Payer: Self-pay | Admitting: Cardiology

## 2022-08-08 VITALS — BP 142/78 | HR 70 | Ht 63.0 in | Wt 143.8 lb

## 2022-08-08 DIAGNOSIS — E1122 Type 2 diabetes mellitus with diabetic chronic kidney disease: Secondary | ICD-10-CM | POA: Diagnosis not present

## 2022-08-08 DIAGNOSIS — I480 Paroxysmal atrial fibrillation: Secondary | ICD-10-CM | POA: Insufficient documentation

## 2022-08-08 DIAGNOSIS — N1832 Chronic kidney disease, stage 3b: Secondary | ICD-10-CM | POA: Insufficient documentation

## 2022-08-08 DIAGNOSIS — N183 Chronic kidney disease, stage 3 unspecified: Secondary | ICD-10-CM | POA: Insufficient documentation

## 2022-08-08 DIAGNOSIS — I129 Hypertensive chronic kidney disease with stage 1 through stage 4 chronic kidney disease, or unspecified chronic kidney disease: Secondary | ICD-10-CM | POA: Insufficient documentation

## 2022-08-08 NOTE — Progress Notes (Signed)
Cardiology Office Note:    Date:  08/08/2022   ID:  Pamela Schwartz, DOB [birth year], MRN [id number]  PCP:  Pamela Brome, MD  Cardiologist:  Pamela Campus, MD    Referring MD: Pamela Brome, MD   Chief Complaint  Patient presents with   Follow-up  Doing fine  History of Present Illness:    Pamela Schwartz is a 81 y.o. female with past medical history significant for paroxysmal atrial fibrillation, so for 1 documented episode of atrial fibrillation which was sick with severe dehydration to secondary to GI infection while in the hospital, she also have thyroid problems kidney failure atherosclerosis of the aorta and essential hypertension. She is in my office today for follow-up overall she is doing very well.  She denies of any chest pain tightness squeezing pressure burning chest.  She did wear a monitor which did not show any evidence of atrial fibrillation I had a long discussion with her about the significance of atrial fibrillation I told her that I suspect her atrial fibrillation was related to her being sick dehydrated with GI back however I am worried that she may have some recurrences and if it does happen she need to be long-term anticoagulated.  We discussed what she needs to do and I recommended to get a Apple Watch that we will around her if she will get episode of atrial fibrillation, alternative to this would be cardia device and I discussed with her briefly.  I also told her if she feels that she have atrial fibrillation and to let me know immediately.  When she had her atrial fibrillation before she felt awful with this so if she have similar sensation she need to let me know.  Past Medical History:  Diagnosis Date   Age-related osteoporosis without current pathological fracture    Arthritis    Barrett's esophagus without dysplasia    Cancer ([place])    denies   Chronic kidney disease    Closed fracture of maxillary sinus ([place]) 07/23/2021   Colitis 05/25/2020   Colitis  05/25/2020   GERD (gastroesophageal reflux disease)    Hearing loss    History of COVID-19    Hypercholesterolemia    Hypertension    Hypertensive chronic kidney disease with stage 1 through stage 4 chronic kidney disease, or unspecified chronic kidney disease    Hyperthyroidism    IBS (irritable bowel syndrome)    Impaired fasting glucose    Low back pain    Mixed hyperlipidemia    Paresthesia of skin    Primary insomnia    Sacroiliac inflammation ([place]) 03/02/2020   Scars    Trochanteric bursitis of left hip 09/29/2020   Vitamin D deficiency, unspecified     Past Surgical History:  Procedure Laterality Date   ANTERIOR CERVICAL DECOMP/DISCECTOMY FUSION N/A 03/06/2016   Procedure: ACDF - C5-C6 - C6-C7  ;  Surgeon: Earnie Larsson, MD;  Location: [facility];  Service: Neurosurgery;  Laterality: N/A;  ACDF - C5-C6 - C6-C7     APPENDECTOMY     BUNIONECTOMY     right and left   CARDIAC CATHETERIZATION     had in [place] around 2012   CATARACT EXTRACTION W/ INTRAOCULAR LENS  IMPLANT, BILATERAL     CESAREAN SECTION     COLONOSCOPY W/ BIOPSIES AND POLYPECTOMY     MULTIPLE TOOTH EXTRACTIONS     TUBAL LIGATION      Current Medications: Current Meds  Medication Sig   allopurinol (ZYLOPRIM) 100 MG  tablet TAKE ONE TABLET BY MOUTH EVERY EVENING (Patient taking differently: Take 100 mg by mouth every evening.)   amLODipine (NORVASC) 10 MG tablet TAKE ONE TABLET BY MOUTH EVERY EVENING (Patient taking differently: Take 10 mg by mouth daily.)   aspirin 81 MG tablet Take 81 mg by mouth daily.   chlorthalidone (HYGROTON) 25 MG tablet TAKE ONE TABLET BY MOUTH EVERY DAY (Patient taking differently: Take 25 mg by mouth daily.)   colchicine 0.6 MG tablet Take 1 TABLET by mouth in the morning and at bedtime. X 1 week for gout flare. (Patient taking differently: Take 0.6 mg by mouth 2 (two) times daily.)   lisinopril (ZESTRIL) 20 MG tablet TAKE ONE TABLET BY MOUTH EVERY DAY   methimazole (TAPAZOLE) 5  MG tablet TAKE ONE TABLET BY MOUTH 3 TIMES DAILY (Patient taking differently: Take 5 mg by mouth 3 (three) times daily.)   metoprolol succinate (TOPROL-XL) 100 MG 24 hr tablet Take 2 tablets (200 mg total) by mouth daily. Take with or immediately following a meal.   ondansetron (ZOFRAN-ODT) 8 MG disintegrating tablet Take 1 tablet (8 mg total) by mouth every 8 (eight) hours as needed for nausea or vomiting.   pantoprazole (PROTONIX) 40 MG tablet TAKE ONE TABLET BY MOUTH EVERY EVENING (Patient taking differently: Take 40 mg by mouth daily.)   promethazine (PHENERGAN) 25 MG tablet take 1 tablet (25 mg) by oral route 4 times per day AS NEEDED nausea/vomiting] (Patient taking differently: Take 25 mg by mouth every 6 (six) hours as needed for nausea or vomiting. take 1 tablet (25 mg) by oral route 4 times per day AS NEEDED nausea/vomiting])   rosuvastatin (CRESTOR) 40 MG tablet TAKE ONE TABLET BY MOUTH EVERY EVENING (Patient taking differently: Take 40 mg by mouth daily.)   traMADol (ULTRAM) 50 MG tablet Take 1 tablet (50 mg total) by mouth every 8 (eight) hours as needed for moderate pain or severe pain. TAKE ONE TABLET BY MOUTH EVERY 8 HOURS AS NEEDED FOR BACK PAIN   VASCEPA 1 g capsule TAKE 2 CAPSULES BY MOUTH EVERY DAY and TAKE 2 CAPSULES BY MOUTH EVERY EVENING (Patient taking differently: Take 2 g by mouth 2 (two) times daily.)   zolpidem (AMBIEN CR) 6.25 MG CR tablet Take 2 tablets (12.5 mg total) by mouth at bedtime as needed. (Patient taking differently: Take 12.5 mg by mouth at bedtime as needed for sleep.)     Allergies:   Clonidine derivatives, Dexilant [dexlansoprazole], Hydralazine, Nickel, Tape, Tizanidine hcl, Zantac [ranitidine hcl], and Chantix [varenicline]   Social History   Socioeconomic History   Marital status: Married    Spouse name: Not on file   Number of children: 3   Years of education: Not on file   Highest education level: Not on file  Occupational History   Occupation:  Retired Charity fundraiser  Tobacco Use   Smoking status: Former    Packs/day: 0.50    Types: Cigarettes    Quit date: 10/02/2015    Years since quitting: 6.8   Smokeless tobacco: Never  Vaping Use   Vaping Use: Never used  Substance and Sexual Activity   Alcohol use: No   Drug use: No   Sexual activity: Not on file  Other Topics Concern   Not on file  Social History Narrative   Not on file   Social Determinants of Health   Financial Resource Strain: Not on file  Food Insecurity: No Food Insecurity (03/21/2022)   Hunger Vital Sign  Worried About Programme researcher, broadcasting/film/video in the Last Year: Never true    Ran Out of Food in the Last Year: Never true  Transportation Needs: No Transportation Needs (03/21/2022)   PRAPARE - Administrator, Civil Service (Medical): No    Lack of Transportation (Non-Medical): No  Physical Activity: Insufficiently Active (03/21/2022)   Exercise Vital Sign    Days of Exercise per Week: 3 days    Minutes of Exercise per Session: 30 min  Stress: No Stress Concern Present (03/21/2022)   [employer] of Occupational Health - Occupational Stress Questionnaire    Feeling of Stress : Not at all  Social Connections: Not on file     Family History: The patient's family history includes Heart attack in her brother, father, mother, and sister; Hypertension in her brother, sister, sister, and sister; Multiple myeloma in her brother; Renal Disease in her brother. ROS:   Please see the history of present illness.    All 14 point review of systems negative except as described per history of present illness  EKGs/Labs/Other Studies Reviewed:      Recent Labs: 06/05/2022: ALT 27; BUN 25; Creatinine, Ser 1.89; Hemoglobin 14.5; Platelets 187; Potassium 4.7; Sodium 142; TSH 2.900  Recent Lipid Panel    Component Value Date/Time   CHOL 126 02/27/2022 0828   TRIG 100 02/27/2022 0828   HDL 61 02/27/2022 0828   CHOLHDL 2.1 02/27/2022 0828   LDLCALC 47 02/27/2022 0828     Physical Exam:    VS:  BP (!) 142/78 (BP Location: Right Arm, Patient Position: Sitting)   Pulse 70   Ht 5\' 3"  (1.6 m)   Wt 143 lb 12.8 oz (65.2 kg)   SpO2 98%   BMI 25.47 kg/m     Wt Readings from Last 3 Encounters:  08/08/22 143 lb 12.8 oz (65.2 kg)  07/23/22 148 lb (67.1 kg)  06/05/22 143 lb (64.9 kg)     GEN:  Well nourished, well developed in no acute distress HEENT: Normal NECK: No JVD; No carotid bruits LYMPHATICS: No lymphadenopathy CARDIAC: RRR, no murmurs, no rubs, no gallops RESPIRATORY:  Clear to auscultation without rales, wheezing or rhonchi  ABDOMEN: Soft, non-tender, non-distended MUSCULOSKELETAL:  No edema; No deformity  SKIN: Warm and dry LOWER EXTREMITIES: no swelling NEUROLOGIC:  Alert and oriented x 3 PSYCHIATRIC:  Normal affect   ASSESSMENT:    1. Paroxysmal atrial fibrillation (HCC)   2. Diabetes mellitus with stage 3 chronic kidney disease (HCC)   3. Hypertensive kidney disease with stage 3b chronic kidney disease (HCC)    PLAN:    In order of problems listed above:  Paroxysmal atrial fibrillation.  Only 1 documented episode when she was sick plan as discussed above.  We will continue monitoring.  No anticoagulation yet but if she have another episode of atrial fibrillation definite need to be anticoagulated, I recommended to get a Apple Watch to see if she had any episode of atrial fibrillation Type 2 diabetes will be followed by internal medicine team I did review K PN which show me her hemoglobin A1c is 5.7 which is good control Dyslipidemia I did review K PN which show me LDL 47 HDL 61 good cholesterol control with Crestor 40 which I will continue   Medication Adjustments/Labs and Tests Ordered: Current medicines are reviewed at length with the patient today.  Concerns regarding medicines are outlined above.  No orders of the defined types were placed in this encounter.  Medication changes: No orders of the defined types were placed in  this encounter.   Signed, Park Liter, MD, [facility] 08/08/2022 2:16 PM    [facility]

## 2022-08-08 NOTE — Patient Instructions (Signed)
Medication Instructions:  Your physician recommends that you continue on your current medications as directed. Please refer to the Current Medication list given to you today.  *If you need a refill on your cardiac medications before your next appointment, please call your pharmacy*   Lab Work: None If you have labs (blood work) drawn today and your tests are completely normal, you will receive your results only by: MyChart Message (if you have MyChart) OR A paper copy in the mail If you have any lab test that is abnormal or we need to change your treatment, we will call you to review the results.   Testing/Procedures: None   Follow-Up: At [facility], you and your health needs are our priority.  As part of our continuing mission to provide you with exceptional heart care, we have created designated Provider Care Teams.  These Care Teams include your primary Cardiologist (physician) and Advanced Practice Providers (APPs -  Physician Assistants and Nurse Practitioners) who all work together to provide you with the care you need, when you need it.  We recommend signing up for the patient portal called "MyChart".  Sign up information is provided on this After Visit Summary.  MyChart is used to connect with patients for Virtual Visits (Telemedicine).  Patients are able to view lab/test results, encounter notes, upcoming appointments, etc.  Non-urgent messages can be sent to your provider as well.   To learn more about what you can do with MyChart, go to [email or web address].    Your next appointment:   6 month(s)  The format for your next appointment:   In Person  Provider:   Robert Krasowski, MD    Other Instructions None  Important Information About Sugar        

## 2022-08-11 ENCOUNTER — Other Ambulatory Visit: Payer: Self-pay | Admitting: Family Medicine

## 2022-08-20 ENCOUNTER — Other Ambulatory Visit: Payer: Self-pay | Admitting: Family Medicine

## 2022-08-20 DIAGNOSIS — E041 Nontoxic single thyroid nodule: Secondary | ICD-10-CM

## 2022-08-21 ENCOUNTER — Encounter: Payer: Self-pay | Admitting: Family Medicine

## 2022-08-23 DIAGNOSIS — E7800 Pure hypercholesterolemia, unspecified: Secondary | ICD-10-CM | POA: Diagnosis not present

## 2022-08-23 DIAGNOSIS — H61303 Acquired stenosis of external ear canal, unspecified, bilateral: Secondary | ICD-10-CM | POA: Diagnosis not present

## 2022-08-23 DIAGNOSIS — Z7982 Long term (current) use of aspirin: Secondary | ICD-10-CM | POA: Diagnosis not present

## 2022-08-23 DIAGNOSIS — E042 Nontoxic multinodular goiter: Secondary | ICD-10-CM | POA: Diagnosis not present

## 2022-08-23 DIAGNOSIS — D497 Neoplasm of unspecified behavior of endocrine glands and other parts of nervous system: Secondary | ICD-10-CM | POA: Diagnosis not present

## 2022-08-23 DIAGNOSIS — H6123 Impacted cerumen, bilateral: Secondary | ICD-10-CM | POA: Diagnosis not present

## 2022-08-24 ENCOUNTER — Other Ambulatory Visit: Payer: Self-pay | Admitting: Family Medicine

## 2022-08-29 ENCOUNTER — Other Ambulatory Visit: Payer: Self-pay | Admitting: Family Medicine

## 2022-09-05 ENCOUNTER — Encounter: Payer: Self-pay | Admitting: Family Medicine

## 2022-09-05 DIAGNOSIS — Z01810 Encounter for preprocedural cardiovascular examination: Secondary | ICD-10-CM | POA: Insufficient documentation

## 2022-09-05 DIAGNOSIS — C73 Malignant neoplasm of thyroid gland: Secondary | ICD-10-CM

## 2022-09-05 DIAGNOSIS — N184 Chronic kidney disease, stage 4 (severe): Secondary | ICD-10-CM | POA: Insufficient documentation

## 2022-09-05 HISTORY — DX: Malignant neoplasm of thyroid gland: C73

## 2022-09-05 NOTE — Progress Notes (Signed)
Subjective:  Patient ID: Pamela Schwartz, female    DOB: 1941/04/22  Age: 81 y.o. MRN: [id number]  Chief Complaint  Patient presents with   Pre-op Exam    HPI Preoperative cv clearance for thyroidectomy surgery. Scheduled in November. Also due for chronic visit.   Diabetes:  Complications: CKD and glomerulopathy.  Glucose checking: not checking Most recent A1C: 5.7. Current medications:  Last Eye Exam: 01/25/2021 Foot checks:daily  Hyperlipidemia: Current medications: On rosuvastatin 40 mg daily and on vascepa 1 gm 2 capsules twice daily.   Hypertensive CKD: Complications: Last creatinine was 1.89. Current medications: lisinopril 20 mg daily, metoprolol succinate 200 mg once daily, chlorthalidone 25 mg daily, amlodipine 10 mg daily, aspirin 81 mg daily.  Patient has seen nephrology.  Atrial fibrillation related to acute illness: on toprol xl 100 mg twice daily. No blood thinners recommended.   Diet: healthy Exercise: active   Current Outpatient Medications on File Prior to Visit  Medication Sig Dispense Refill   allopurinol (ZYLOPRIM) 100 MG tablet TAKE ONE TABLET BY MOUTH EVERY EVENING (Patient taking differently: Take 100 mg by mouth every evening.) 90 tablet 1   amLODipine (NORVASC) 10 MG tablet TAKE ONE TABLET BY MOUTH EVERY EVENING (Patient taking differently: Take 10 mg by mouth daily.) 90 tablet 3   aspirin 81 MG tablet Take 81 mg by mouth daily.     chlorthalidone (HYGROTON) 25 MG tablet Take 1 tablet (25 mg total) by mouth daily. 90 tablet 0   colchicine 0.6 MG tablet Take 1 TABLET by mouth in the morning and at bedtime. X 1 week for gout flare. (Patient taking differently: Take 0.6 mg by mouth 2 (two) times daily.) 14 tablet 2   lisinopril (ZESTRIL) 20 MG tablet TAKE ONE TABLET BY MOUTH EVERY DAY 90 tablet 1   methimazole (TAPAZOLE) 5 MG tablet TAKE ONE TABLET BY MOUTH 3 TIMES DAILY (Patient taking differently: Take 5 mg by mouth 3 (three) times daily.) 90 tablet 1    metoprolol succinate (TOPROL-XL) 100 MG 24 hr tablet TAKE 2 TABLETS BY MOUTH EVERY DAY 180 tablet 1   ondansetron (ZOFRAN-ODT) 8 MG disintegrating tablet Take 1 tablet (8 mg total) by mouth every 8 (eight) hours as needed for nausea or vomiting. 30 tablet 0   pantoprazole (PROTONIX) 40 MG tablet TAKE ONE TABLET BY MOUTH EVERY EVENING (Patient taking differently: Take 40 mg by mouth daily.) 90 tablet 3   promethazine (PHENERGAN) 25 MG tablet take 1 tablet (25 mg) by oral route 4 times per day AS NEEDED nausea/vomiting] (Patient taking differently: Take 25 mg by mouth every 6 (six) hours as needed for nausea or vomiting. take 1 tablet (25 mg) by oral route 4 times per day AS NEEDED nausea/vomiting]) 60 tablet 3   rosuvastatin (CRESTOR) 40 MG tablet TAKE ONE TABLET BY MOUTH EVERY EVENING (Patient taking differently: Take 40 mg by mouth daily.) 90 tablet 1   traMADol (ULTRAM) 50 MG tablet Take 1 tablet (50 mg total) by mouth every 8 (eight) hours as needed for moderate pain or severe pain. TAKE ONE TABLET BY MOUTH EVERY 8 HOURS AS NEEDED FOR BACK PAIN 90 tablet 1   VASCEPA 1 g capsule TAKE 2 CAPSULES BY MOUTH EVERY DAY and TAKE 2 CAPSULES BY MOUTH EVERY EVENING (Patient taking differently: Take 2 g by mouth 2 (two) times daily.) 360 capsule 1   zolpidem (AMBIEN CR) 6.25 MG CR tablet Take 2 tablets (12.5 mg total) by mouth at bedtime as needed. (  Patient taking differently: Take 12.5 mg by mouth at bedtime as needed for sleep.) 60 tablet 2   No current facility-administered medications on file prior to visit.   Past Medical History:  Diagnosis Date   Age-related osteoporosis without current pathological fracture    Arthritis    Barrett's esophagus without dysplasia    Cancer ([place])    denies   Chronic kidney disease    Closed fracture of maxillary sinus ([place]) 07/23/2021   Colitis 05/25/2020   Colitis 05/25/2020   GERD (gastroesophageal reflux disease)    Hearing loss    History of COVID-19     Hypercholesterolemia    Hypertension    Hypertensive chronic kidney disease with stage 1 through stage 4 chronic kidney disease, or unspecified chronic kidney disease    Hyperthyroidism    IBS (irritable bowel syndrome)    Impaired fasting glucose    Low back pain    Mixed hyperlipidemia    Paresthesia of skin    Primary insomnia    Sacroiliac inflammation ([place]) 03/02/2020   Scars    Trochanteric bursitis of left hip 09/29/2020   Vitamin D deficiency, unspecified    Past Surgical History:  Procedure Laterality Date   ANTERIOR CERVICAL DECOMP/DISCECTOMY FUSION N/A 03/06/2016   Procedure: ACDF - C5-C6 - C6-C7  ;  Surgeon: Earnie Larsson, MD;  Location: [facility];  Service: Neurosurgery;  Laterality: N/A;  ACDF - C5-C6 - C6-C7     APPENDECTOMY     BUNIONECTOMY     right and left   CARDIAC CATHETERIZATION     had in [place] around 2012   CATARACT EXTRACTION W/ INTRAOCULAR LENS  IMPLANT, BILATERAL     CESAREAN SECTION     COLONOSCOPY W/ BIOPSIES AND POLYPECTOMY     MULTIPLE TOOTH EXTRACTIONS     TUBAL LIGATION      Family History  Problem Relation Age of Onset   Heart attack Mother    Heart attack Father    Hypertension Sister    Hypertension Brother    Renal Disease Brother    Heart attack Brother    Multiple myeloma Brother    Hypertension Sister    Hypertension Sister    Heart attack Sister    Social History   Socioeconomic History   Marital status: Married    Spouse name: Not on file   Number of children: 3   Years of education: Not on file   Highest education level: Not on file  Occupational History   Occupation: Retired Therapist, sports  Tobacco Use   Smoking status: Former    Packs/day: 0.50    Types: Cigarettes    Quit date: 10/02/2015    Years since quitting: 6.9   Smokeless tobacco: Never  Vaping Use   Vaping Use: Never used  Substance and Sexual Activity   Alcohol use: No   Drug use: No   Sexual activity: Not on file  Other Topics Concern   Not on file   Social History Narrative   Not on file   Social Determinants of Health   Financial Resource Strain: Not on file  Food Insecurity: No Food Insecurity (03/21/2022)   Hunger Vital Sign    Worried About Running Out of Food in the Last Year: Never true    Ran Out of Food in the Last Year: Never true  Transportation Needs: No Transportation Needs (03/21/2022)   PRAPARE - Transportation    Lack of Transportation (Medical): No    Lack  of Transportation (Non-Medical): No  Physical Activity: Insufficiently Active (03/21/2022)   Exercise Vital Sign    Days of Exercise per Week: 3 days    Minutes of Exercise per Session: 30 min  Stress: No Stress Concern Present (03/21/2022)   [employer] of Occupational Health - Occupational Stress Questionnaire    Feeling of Stress : Not at all  Social Connections: Not on file    Review of Systems  Constitutional:  Negative for chills and fatigue.  HENT:  Negative for congestion, ear pain, nosebleeds, sinus pain and sore throat.   Respiratory:  Negative for cough and shortness of breath.   Cardiovascular:  Negative for chest pain and leg swelling.  Gastrointestinal:  Negative for abdominal pain, constipation, diarrhea, nausea and vomiting.  Genitourinary:  Negative for dysuria and frequency.  Musculoskeletal:  Negative for arthralgias, back pain and myalgias.  Neurological:  Negative for dizziness and headaches.     Objective:  BP 102/78   Pulse 60   Temp (!) 97.4 F (36.3 C)   Resp 16   Ht 5\' 5"  (1.651 m)   Wt 147 lb 3.2 oz (66.8 kg)   SpO2 99%   BMI 24.50 kg/m      09/06/2022    7:44 AM 08/08/2022    1:45 PM 07/23/2022    2:45 PM  BP/Weight  Systolic BP 102 142 118  Diastolic BP 78 78 64  Wt. (Lbs) 147.2 143.8 148  BMI 24.5 kg/m2 25.47 kg/m2 26.22 kg/m2    Physical Exam Vitals reviewed.  Constitutional:      Appearance: Normal appearance. She is normal weight.  Neck:     Vascular: No carotid bruit.  Cardiovascular:      Rate and Rhythm: Normal rate and regular rhythm.     Heart sounds: Normal heart sounds.  Pulmonary:     Effort: Pulmonary effort is normal. No respiratory distress.     Breath sounds: Normal breath sounds.  Abdominal:     General: Abdomen is flat. Bowel sounds are normal.     Palpations: Abdomen is soft.     Tenderness: There is no abdominal tenderness.  Neurological:     Mental Status: She is alert and oriented to person, place, and time.  Psychiatric:        Mood and Affect: Mood normal.        Behavior: Behavior normal.     Diabetic Foot Exam - Simple   Simple Foot Form Diabetic Foot exam was performed with the following findings: Yes 09/06/2022  8:24 AM  Visual Inspection See comments: Yes Sensation Testing Intact to touch and monofilament testing bilaterally: Yes Pulse Check Posterior Tibialis and Dorsalis pulse intact bilaterally: Yes Comments Callus left foot.      Lab Results  Component Value Date   WBC 11.1 (H) 06/05/2022   HGB 14.5 06/05/2022   HCT 42.5 06/05/2022   PLT 187 06/05/2022   GLUCOSE 102 (H) 06/05/2022   CHOL 126 02/27/2022   TRIG 100 02/27/2022   HDL 61 02/27/2022   LDLCALC 47 02/27/2022   ALT 27 06/05/2022   AST 23 06/05/2022   NA 142 06/05/2022   K 4.7 06/05/2022   CL 102 06/05/2022   CREATININE 1.89 (H) 06/05/2022   BUN 25 06/05/2022   CO2 25 06/05/2022   TSH 2.900 06/05/2022   HGBA1C 5.7 (H) 06/05/2022   MICROALBUR 150 10/17/2021      Assessment & Plan:   Problem List Items Addressed This Visit  Cardiovascular and Mediastinum   Paroxysmal atrial fibrillation (HCC)    On metoprolol xl 100 mg 2 daily.         Endocrine   Diabetes mellitus with stage 3 chronic kidney disease (HCC)    Control: good Recommend check sugars fasting daily. Recommend check feet daily. Recommend annual eye exams. Medicines: none Continue to work on eating a healthy diet and exercise.  Labs drawn today.         Hyperthyroidism     The current medical regimen is effective;  continue present plan and medications. On methimazole 5 mg three times a day.       Relevant Orders   T4, free   TSH   Thyroid cancer ([place])    Total thyroidectomy is scheduled.         Musculoskeletal and Integument   Chronic idiopathic gout involving toe of right foot without tophus    The current medical regimen is effective;  continue present plan and medications.         Genitourinary   Hypertensive kidney disease with stage 3b chronic kidney disease ([place])    Well controlled.  No changes to medicines. Continue lisinopril 20 mg daily, metoprolol succinate 200 mg once daily, chlorthalidone 25 mg daily, amlodipine 10 mg daily, aspirin 81 mg daily.   Continue to work on eating a healthy diet and exercise.  Labs drawn today.        Relevant Orders   Comprehensive metabolic panel   CBC with Differential/Platelet   Chronic kidney disease, stage IV (severe) (HCC)     Other   Mixed hyperlipidemia    Well controlled.  No changes to medicines. Continue rosuvastatin 40 mg daily and on vascepa 1 gm 2 capsules twice daily.   Continue to work on eating a healthy diet and exercise.  Labs drawn today.        Relevant Orders   Lipid panel   Preoperative cardiovascular examination - Primary    Order labs, ekg, and cxr.  Anticipate clearance. IV hydration to prevent worsening renal failure.       Relevant Orders   POCT URINALYSIS DIP (CLINITEK)   EKG 12-Lead (Completed)   DG Chest 2 View  .  No orders of the defined types were placed in this encounter.   Orders Placed This Encounter  Procedures   DG Chest 2 View   Microalbumin / creatinine urine ratio   Lipid panel   Hemoglobin A1c   Comprehensive metabolic panel   CBC with Differential/Platelet   T4, free   TSH   POCT URINALYSIS DIP (CLINITEK)   EKG 12-Lead     Follow-up: Return in about 3 months (around 12/07/2022) for chronic fasting.  An After Visit Summary was  printed and given to the patient.  Rochel Brome, MD [facility] [phone]

## 2022-09-05 NOTE — Assessment & Plan Note (Signed)
The current medical regimen is effective;  continue present plan and medications.  

## 2022-09-05 NOTE — Assessment & Plan Note (Signed)
The current medical regimen is effective;  continue present plan and medications. On methimazole 5 mg three times a day.

## 2022-09-05 NOTE — Assessment & Plan Note (Signed)
Total thyroidectomy is scheduled.

## 2022-09-05 NOTE — Assessment & Plan Note (Signed)
Control: good Recommend check sugars fasting daily. Recommend check feet daily. Recommend annual eye exams. Medicines: none Continue to work on eating a healthy diet and exercise.  Labs drawn today.    

## 2022-09-05 NOTE — Assessment & Plan Note (Signed)
>>  ASSESSMENT AND PLAN FOR HYPERTENSIVE KIDNEY DISEASE WITH STAGE 3B CHRONIC KIDNEY DISEASE ([place]) WRITTEN ON 09/05/2022  8:53 PM BY COX, KIRSTEN, MD  Well controlled.  No changes to medicines. Continue lisinopril 20 mg daily, metoprolol succinate 200 mg once daily, chlorthalidone 25 mg daily, amlodipine 10 mg daily, aspirin 81 mg daily.  Continue to work on eating a healthy diet and exercise.  Labs drawn today.

## 2022-09-05 NOTE — Assessment & Plan Note (Signed)
Well controlled.  No changes to medicines. Continue rosuvastatin 40 mg daily and on vascepa 1 gm 2 capsules twice daily.  Continue to work on eating a healthy diet and exercise.  Labs drawn today.

## 2022-09-05 NOTE — Assessment & Plan Note (Signed)
Order labs, ekg, and cxr.  Anticipate clearance. IV hydration to prevent worsening renal failure.

## 2022-09-05 NOTE — Assessment & Plan Note (Signed)
Well controlled.  No changes to medicines. Continue lisinopril 20 mg daily, metoprolol succinate 200 mg once daily, chlorthalidone 25 mg daily, amlodipine 10 mg daily, aspirin 81 mg daily.  Continue to work on eating a healthy diet and exercise.  Labs drawn today.

## 2022-09-06 ENCOUNTER — Ambulatory Visit (INDEPENDENT_AMBULATORY_CARE_PROVIDER_SITE_OTHER): Payer: Medicare Other | Admitting: Family Medicine

## 2022-09-06 VITALS — BP 102/78 | HR 60 | Temp 97.4°F | Resp 16 | Ht 65.0 in | Wt 147.2 lb

## 2022-09-06 DIAGNOSIS — C73 Malignant neoplasm of thyroid gland: Secondary | ICD-10-CM

## 2022-09-06 DIAGNOSIS — N184 Chronic kidney disease, stage 4 (severe): Secondary | ICD-10-CM

## 2022-09-06 DIAGNOSIS — E0590 Thyrotoxicosis, unspecified without thyrotoxic crisis or storm: Secondary | ICD-10-CM

## 2022-09-06 DIAGNOSIS — M1A0710 Idiopathic chronic gout, right ankle and foot, without tophus (tophi): Secondary | ICD-10-CM

## 2022-09-06 DIAGNOSIS — I480 Paroxysmal atrial fibrillation: Secondary | ICD-10-CM | POA: Diagnosis not present

## 2022-09-06 DIAGNOSIS — I129 Hypertensive chronic kidney disease with stage 1 through stage 4 chronic kidney disease, or unspecified chronic kidney disease: Secondary | ICD-10-CM

## 2022-09-06 DIAGNOSIS — N1832 Chronic kidney disease, stage 3b: Secondary | ICD-10-CM

## 2022-09-06 DIAGNOSIS — Z01810 Encounter for preprocedural cardiovascular examination: Secondary | ICD-10-CM

## 2022-09-06 DIAGNOSIS — E1121 Type 2 diabetes mellitus with diabetic nephropathy: Secondary | ICD-10-CM

## 2022-09-06 DIAGNOSIS — E782 Mixed hyperlipidemia: Secondary | ICD-10-CM | POA: Diagnosis not present

## 2022-09-06 DIAGNOSIS — Z01818 Encounter for other preprocedural examination: Secondary | ICD-10-CM | POA: Diagnosis not present

## 2022-09-08 LAB — CBC WITH DIFFERENTIAL/PLATELET
Basophils Absolute: 0 10*3/uL (ref 0.0–0.2)
Basos: 0 %
EOS (ABSOLUTE): 0.3 10*3/uL (ref 0.0–0.4)
Eos: 4 %
Hematocrit: 41.3 % (ref 34.0–46.6)
Hemoglobin: 14.2 g/dL (ref 11.1–15.9)
Immature Grans (Abs): 0 10*3/uL (ref 0.0–0.1)
Immature Granulocytes: 0 %
Lymphocytes Absolute: 2.8 10*3/uL (ref 0.7–3.1)
Lymphs: 32 %
MCH: 30.9 pg (ref 26.6–33.0)
MCHC: 34.4 g/dL (ref 31.5–35.7)
MCV: 90 fL (ref 79–97)
Monocytes Absolute: 0.7 10*3/uL (ref 0.1–0.9)
Monocytes: 8 %
Neutrophils Absolute: 5 10*3/uL (ref 1.4–7.0)
Neutrophils: 56 %
Platelets: 187 10*3/uL (ref 150–450)
RBC: 4.6 x10E6/uL (ref 3.77–5.28)
RDW: 12.1 % (ref 11.7–15.4)
WBC: 8.8 10*3/uL (ref 3.4–10.8)

## 2022-09-08 LAB — COMPREHENSIVE METABOLIC PANEL
ALT: 42 IU/L — ABNORMAL HIGH (ref 0–32)
AST: 40 IU/L (ref 0–40)
Albumin/Globulin Ratio: 1.6 (ref 1.2–2.2)
Albumin: 4.1 g/dL (ref 3.8–4.8)
Alkaline Phosphatase: 112 IU/L (ref 44–121)
BUN/Creatinine Ratio: 13 (ref 12–28)
BUN: 19 mg/dL (ref 8–27)
Bilirubin Total: 0.3 mg/dL (ref 0.0–1.2)
CO2: 25 mmol/L (ref 20–29)
Calcium: 9.6 mg/dL (ref 8.7–10.3)
Chloride: 101 mmol/L (ref 96–106)
Creatinine, Ser: 1.5 mg/dL — ABNORMAL HIGH (ref 0.57–1.00)
Globulin, Total: 2.6 g/dL (ref 1.5–4.5)
Glucose: 96 mg/dL (ref 70–99)
Potassium: 4.1 mmol/L (ref 3.5–5.2)
Sodium: 140 mmol/L (ref 134–144)
Total Protein: 6.7 g/dL (ref 6.0–8.5)
eGFR: 35 mL/min/{1.73_m2} — ABNORMAL LOW (ref >59)

## 2022-09-08 LAB — MICROALBUMIN / CREATININE URINE RATIO
Creatinine, Urine: 53.7 mg/dL
Microalb/Creat Ratio: 16 mg/g creat (ref 0–29)
Microalbumin, Urine: 8.5 ug/mL

## 2022-09-08 LAB — CARDIOVASCULAR RISK ASSESSMENT

## 2022-09-08 LAB — LIPID PANEL
Chol/HDL Ratio: 2.2 ratio (ref 0.0–4.4)
Cholesterol, Total: 151 mg/dL (ref 100–199)
HDL: 68 mg/dL (ref >39)
LDL Chol Calc (NIH): 63 mg/dL (ref 0–99)
Triglycerides: 114 mg/dL (ref 0–149)
VLDL Cholesterol Cal: 20 mg/dL (ref 5–40)

## 2022-09-08 LAB — TSH: TSH: 58.6 u[IU]/mL — ABNORMAL HIGH (ref 0.450–4.500)

## 2022-09-08 LAB — HEMOGLOBIN A1C
Est. average glucose Bld gHb Est-mCnc: 123 mg/dL
Hgb A1c MFr Bld: 5.9 % — ABNORMAL HIGH (ref 4.8–5.6)

## 2022-09-08 LAB — T4, FREE: Free T4: 0.61 ng/dL — ABNORMAL LOW (ref 0.82–1.77)

## 2022-09-10 NOTE — Progress Notes (Signed)
Blood count normal.  Liver function normal.  Kidney function abnormal. Little better. Thyroid labs very abnormal. Request she stop methimazole again. She should not need it after surgery. Cholesterol: good HBA1C: 5.9 Not spilling protein in urine.  Please forward to Dr. Gaylyn Cheers Also will need cxr and ekg forwarded.  Hoyle Sauer, can you look in your folder to see if you have a preop form. If you do not have it, I will look in my folders. Thanks, Dr. Tobie Poet

## 2022-09-11 ENCOUNTER — Other Ambulatory Visit: Payer: Self-pay

## 2022-09-11 LAB — POCT URINALYSIS DIP (CLINITEK)
Bilirubin, UA: NEGATIVE
Blood, UA: NEGATIVE
Glucose, UA: NEGATIVE mg/dL
Ketones, POC UA: NEGATIVE mg/dL
Leukocytes, UA: NEGATIVE
Nitrite, UA: NEGATIVE
POC PROTEIN,UA: NEGATIVE
Spec Grav, UA: 1.015 (ref 1.010–1.025)
Urobilinogen, UA: NEGATIVE E.U./dL — AB
pH, UA: 6 (ref 5.0–8.0)

## 2022-09-15 ENCOUNTER — Other Ambulatory Visit: Payer: Self-pay | Admitting: Family Medicine

## 2022-09-15 DIAGNOSIS — E0590 Thyrotoxicosis, unspecified without thyrotoxic crisis or storm: Secondary | ICD-10-CM

## 2022-09-19 ENCOUNTER — Other Ambulatory Visit: Payer: Self-pay | Admitting: Family Medicine

## 2022-09-19 DIAGNOSIS — Z79899 Other long term (current) drug therapy: Secondary | ICD-10-CM | POA: Diagnosis not present

## 2022-09-19 DIAGNOSIS — I129 Hypertensive chronic kidney disease with stage 1 through stage 4 chronic kidney disease, or unspecified chronic kidney disease: Secondary | ICD-10-CM | POA: Diagnosis not present

## 2022-09-19 DIAGNOSIS — K219 Gastro-esophageal reflux disease without esophagitis: Secondary | ICD-10-CM | POA: Diagnosis not present

## 2022-09-19 DIAGNOSIS — N189 Chronic kidney disease, unspecified: Secondary | ICD-10-CM | POA: Diagnosis not present

## 2022-09-19 DIAGNOSIS — D497 Neoplasm of unspecified behavior of endocrine glands and other parts of nervous system: Secondary | ICD-10-CM | POA: Diagnosis not present

## 2022-09-19 DIAGNOSIS — E041 Nontoxic single thyroid nodule: Secondary | ICD-10-CM | POA: Diagnosis not present

## 2022-09-19 DIAGNOSIS — I4891 Unspecified atrial fibrillation: Secondary | ICD-10-CM | POA: Diagnosis not present

## 2022-09-19 DIAGNOSIS — E049 Nontoxic goiter, unspecified: Secondary | ICD-10-CM | POA: Diagnosis not present

## 2022-09-19 DIAGNOSIS — E042 Nontoxic multinodular goiter: Secondary | ICD-10-CM | POA: Diagnosis not present

## 2022-09-20 DIAGNOSIS — E890 Postprocedural hypothyroidism: Secondary | ICD-10-CM | POA: Diagnosis not present

## 2022-09-21 DIAGNOSIS — E890 Postprocedural hypothyroidism: Secondary | ICD-10-CM | POA: Diagnosis not present

## 2022-09-25 ENCOUNTER — Other Ambulatory Visit: Payer: Self-pay | Admitting: Family Medicine

## 2022-09-27 DIAGNOSIS — E890 Postprocedural hypothyroidism: Secondary | ICD-10-CM | POA: Diagnosis not present

## 2022-10-09 ENCOUNTER — Encounter: Payer: Self-pay | Admitting: Family Medicine

## 2022-10-09 ENCOUNTER — Ambulatory Visit: Payer: Medicare Other | Admitting: Family Medicine

## 2022-10-09 DIAGNOSIS — H16143 Punctate keratitis, bilateral: Secondary | ICD-10-CM | POA: Diagnosis not present

## 2022-10-09 DIAGNOSIS — E119 Type 2 diabetes mellitus without complications: Secondary | ICD-10-CM | POA: Diagnosis not present

## 2022-10-09 DIAGNOSIS — H353131 Nonexudative age-related macular degeneration, bilateral, early dry stage: Secondary | ICD-10-CM | POA: Diagnosis not present

## 2022-10-09 LAB — HM DIABETES EYE EXAM

## 2022-10-12 ENCOUNTER — Other Ambulatory Visit: Payer: Self-pay | Admitting: Family Medicine

## 2022-10-13 ENCOUNTER — Other Ambulatory Visit: Payer: Self-pay | Admitting: Family Medicine

## 2022-10-23 ENCOUNTER — Other Ambulatory Visit: Payer: Self-pay | Admitting: Family Medicine

## 2022-10-23 DIAGNOSIS — M1A0710 Idiopathic chronic gout, right ankle and foot, without tophus (tophi): Secondary | ICD-10-CM

## 2022-11-09 ENCOUNTER — Other Ambulatory Visit: Payer: Self-pay | Admitting: Family Medicine

## 2022-12-03 ENCOUNTER — Telehealth: Payer: Self-pay

## 2022-12-03 NOTE — Telephone Encounter (Signed)
Patient

## 2022-12-03 NOTE — Telephone Encounter (Signed)
Patient called stated that Pharmacy sent over request for levothyroxine 100 mg. Patient stated she has an appointment with you 12/14/22, and this medication was started by Dr. Gaylyn Cheers after her surgery. She has been release from that doctor but was told records would be sent to you so you can refill medication. Please advise

## 2022-12-04 ENCOUNTER — Other Ambulatory Visit: Payer: Self-pay

## 2022-12-04 DIAGNOSIS — E0590 Thyrotoxicosis, unspecified without thyrotoxic crisis or storm: Secondary | ICD-10-CM

## 2022-12-04 NOTE — Telephone Encounter (Signed)
Patient made aware, She stated she has not been feeling her best. Lab appt made for 12/05/21. Order in

## 2022-12-05 ENCOUNTER — Other Ambulatory Visit: Payer: Medicare Other

## 2022-12-05 DIAGNOSIS — E0590 Thyrotoxicosis, unspecified without thyrotoxic crisis or storm: Secondary | ICD-10-CM

## 2022-12-06 LAB — T4, FREE: Free T4: 1.46 ng/dL (ref 0.82–1.77)

## 2022-12-06 LAB — TSH: TSH: 6.27 u[IU]/mL — ABNORMAL HIGH (ref 0.450–4.500)

## 2022-12-07 ENCOUNTER — Other Ambulatory Visit: Payer: Self-pay

## 2022-12-07 ENCOUNTER — Other Ambulatory Visit: Payer: Self-pay | Admitting: Family Medicine

## 2022-12-07 DIAGNOSIS — E0590 Thyrotoxicosis, unspecified without thyrotoxic crisis or storm: Secondary | ICD-10-CM

## 2022-12-07 MED ORDER — LEVOTHYROXINE SODIUM 112 MCG PO TABS: 112.0000 ug | ORAL_TABLET | Freq: Every day | ORAL | 0 refills | Status: DC

## 2022-12-09 ENCOUNTER — Other Ambulatory Visit: Payer: Self-pay | Admitting: Family Medicine

## 2022-12-10 ENCOUNTER — Other Ambulatory Visit: Payer: Self-pay | Admitting: Family Medicine

## 2022-12-12 ENCOUNTER — Other Ambulatory Visit: Payer: Self-pay | Admitting: Family Medicine

## 2022-12-14 ENCOUNTER — Ambulatory Visit (INDEPENDENT_AMBULATORY_CARE_PROVIDER_SITE_OTHER): Payer: Medicare Other | Admitting: Family Medicine

## 2022-12-14 ENCOUNTER — Encounter: Payer: Self-pay | Admitting: Family Medicine

## 2022-12-14 VITALS — BP 136/78 | HR 75 | Temp 97.0°F | Ht 65.0 in | Wt 146.0 lb

## 2022-12-14 DIAGNOSIS — M1A0710 Idiopathic chronic gout, right ankle and foot, without tophus (tophi): Secondary | ICD-10-CM | POA: Diagnosis not present

## 2022-12-14 DIAGNOSIS — I480 Paroxysmal atrial fibrillation: Secondary | ICD-10-CM | POA: Diagnosis not present

## 2022-12-14 DIAGNOSIS — C73 Malignant neoplasm of thyroid gland: Secondary | ICD-10-CM

## 2022-12-14 DIAGNOSIS — E782 Mixed hyperlipidemia: Secondary | ICD-10-CM

## 2022-12-14 DIAGNOSIS — I129 Hypertensive chronic kidney disease with stage 1 through stage 4 chronic kidney disease, or unspecified chronic kidney disease: Secondary | ICD-10-CM

## 2022-12-14 DIAGNOSIS — E890 Postprocedural hypothyroidism: Secondary | ICD-10-CM | POA: Diagnosis not present

## 2022-12-14 DIAGNOSIS — E1121 Type 2 diabetes mellitus with diabetic nephropathy: Secondary | ICD-10-CM

## 2022-12-14 DIAGNOSIS — I700 Atherosclerosis of aorta: Secondary | ICD-10-CM

## 2022-12-14 DIAGNOSIS — G894 Chronic pain syndrome: Secondary | ICD-10-CM

## 2022-12-14 DIAGNOSIS — E1322 Other specified diabetes mellitus with diabetic chronic kidney disease: Secondary | ICD-10-CM

## 2022-12-14 DIAGNOSIS — N184 Chronic kidney disease, stage 4 (severe): Secondary | ICD-10-CM | POA: Diagnosis not present

## 2022-12-14 DIAGNOSIS — E0590 Thyrotoxicosis, unspecified without thyrotoxic crisis or storm: Secondary | ICD-10-CM

## 2022-12-14 DIAGNOSIS — N1832 Chronic kidney disease, stage 3b: Secondary | ICD-10-CM | POA: Diagnosis not present

## 2022-12-14 NOTE — Assessment & Plan Note (Signed)
The current medical regimen is effective;  continue present plan and medications. Continue allopurinol 100 mg daily.  Has colchicine for flare ups.

## 2022-12-14 NOTE — Patient Instructions (Signed)
Start kerendia 10 mg daily. Recheck cmp in 4 weeks when gets thyroid levels.

## 2022-12-14 NOTE — Progress Notes (Unsigned)
Subjective:  Patient ID: Pamela Schwartz, female    DOB: 09-22-1941  Age: 82 y.o. MRN: [id number]  Chief Complaint  Patient presents with   Diabetes   Hyperlipidemia   HPI: Diabetes:  Complications: CKD and glomerulopathy.  Glucose checking: not checking Most recent A1C: 5.9. Current medications: none.  Last Eye Exam: 01/25/2021 Foot checks:daily  Hyperlipidemia: Current medications: On rosuvastatin 40 mg daily and on vascepa 1 gm 2 capsules twice daily.   Hypertensive CKD: Complications: Last creatinine was 1.89. Current medications: lisinopril 20 mg daily, metoprolol succinate 200 mg once daily, chlorthalidone 25 mg daily, amlodipine 10 mg daily, aspirin 81 mg daily.  Patient has seen nephrology.   Atrial fibrillation related to acute illness: on toprol xl 100 mg twice daily. No blood thinners recommended.   Diet: healthy Exercise: active   Back Pain: on tramadol 50 mg twice daily.  Ambien cr 12.5 mg one before bed.       03/21/2022    3:34 PM 10/17/2021    7:41 AM 07/13/2021    7:41 AM 04/06/2021   10:43 AM 02/01/2021   10:01 AM  Depression screen PHQ 2/9  Decreased Interest 0 0 0 0 0  Down, Depressed, Hopeless 0 0 0 0 0  PHQ - 2 Score 0 0 0 0 0         07/13/2021    7:41 AM 07/21/2021   11:34 AM 10/17/2021    7:41 AM 03/21/2022    3:42 PM 06/05/2022    1:29 PM  Fall Risk  Falls in the past year? 1 1 0 0 0  Was there an injury with Fall? 1 1 0 0   Fall Risk Category Calculator 2 3 0 0   Fall Risk Category (Retired) Moderate High Low Low   (RETIRED) Patient Fall Risk Level    Low fall risk Low fall risk  Patient at Risk for Falls Due to    No Fall Risks   Fall risk Follow up Falls evaluation completed  Falls evaluation completed        Review of Systems  Constitutional:  Negative for appetite change, fatigue and fever.  HENT:  Negative for congestion, ear pain, sinus pressure and sore throat.   Respiratory:  Negative for cough, chest tightness, shortness of  breath and wheezing.   Cardiovascular:  Negative for chest pain and palpitations.  Gastrointestinal:  Negative for abdominal pain, anal bleeding, constipation, diarrhea, nausea and vomiting.  Genitourinary:  Negative for dysuria and hematuria.  Musculoskeletal:  Positive for back pain. Negative for arthralgias, joint swelling and myalgias.  Skin:  Negative for rash.  Neurological:  Negative for dizziness, weakness and headaches.  Psychiatric/Behavioral:  Negative for dysphoric mood. The patient is not nervous/anxious.     Current Outpatient Medications on File Prior to Visit  Medication Sig Dispense Refill   allopurinol (ZYLOPRIM) 100 MG tablet Take 1 tablet (100 mg total) by mouth every evening. 90 tablet 1   amLODipine (NORVASC) 10 MG tablet TAKE ONE TABLET BY MOUTH EVERY EVENING 90 tablet 1   aspirin 81 MG tablet Take 81 mg by mouth daily.     chlorthalidone (HYGROTON) 25 MG tablet TAKE ONE TABLET BY MOUTH EVERY DAY 90 tablet 1   levothyroxine (SYNTHROID) 112 MCG tablet Take 1 tablet (112 mcg total) by mouth daily. 60 tablet 0   lisinopril (ZESTRIL) 20 MG tablet TAKE ONE TABLET BY MOUTH EVERY DAY 90 tablet 1   metoprolol succinate (TOPROL-XL) 100 MG 24  hr tablet TAKE 2 TABLETS BY MOUTH EVERY DAY 180 tablet 1   ondansetron (ZOFRAN-ODT) 8 MG disintegrating tablet Take 1 tablet (8 mg total) by mouth every 8 (eight) hours as needed for nausea or vomiting. 30 tablet 0   pantoprazole (PROTONIX) 40 MG tablet TAKE ONE TABLET BY MOUTH EVERY EVENING (Patient taking differently: Take 40 mg by mouth daily.) 90 tablet 3   promethazine (PHENERGAN) 25 MG tablet take 1 tablet (25 mg) by oral route 4 times per day AS NEEDED nausea/vomiting 60 tablet 3   rosuvastatin (CRESTOR) 40 MG tablet TAKE ONE TABLET BY MOUTH EVERY EVENING (Patient taking differently: Take 40 mg by mouth daily.) 90 tablet 1   traMADol (ULTRAM) 50 MG tablet Take 1 tablet (50 mg total) by mouth every 8 (eight) hours as needed for back  pain (moderate pain or severe pain). 90 tablet 1   VASCEPA 1 g capsule TAKE 2 CAPSULES BY MOUTH TWICE DAILY 360 capsule 1   zolpidem (AMBIEN CR) 12.5 MG CR tablet Take 1 tablet (12.5 mg total) by mouth at bedtime. 30 tablet 5   potassium chloride SA (KLOR-CON M) 20 MEQ tablet TAKE ONE TABLET BY MOUTH TWICE DAILY (Patient not taking: Reported on 12/14/2022) 180 tablet 1   No current facility-administered medications on file prior to visit.   Past Medical History:  Diagnosis Date   Age-related osteoporosis without current pathological fracture    Arthritis    Barrett's esophagus without dysplasia    Cancer ([place])    denies   Chronic kidney disease    Closed fracture of maxillary sinus ([place]) 07/23/2021   Colitis 05/25/2020   Colitis 05/25/2020   GERD (gastroesophageal reflux disease)    Hearing loss    History of COVID-19    Hypercholesterolemia    Hypertension    Hypertensive chronic kidney disease with stage 1 through stage 4 chronic kidney disease, or unspecified chronic kidney disease    Hyperthyroidism    IBS (irritable bowel syndrome)    Impaired fasting glucose    Low back pain    Mixed hyperlipidemia    Paresthesia of skin    Primary insomnia    Sacroiliac inflammation ([place]) 03/02/2020   Scars    Thyroid cancer ([place]) 09/05/2022   Trochanteric bursitis of left hip 09/29/2020   Vitamin D deficiency, unspecified    Past Surgical History:  Procedure Laterality Date   ANTERIOR CERVICAL DECOMP/DISCECTOMY FUSION N/A 03/06/2016   Procedure: ACDF - C5-C6 - C6-C7  ;  Surgeon: Earnie Larsson, MD;  Location: [facility];  Service: Neurosurgery;  Laterality: N/A;  ACDF - C5-C6 - C6-C7     APPENDECTOMY     BUNIONECTOMY     right and left   CARDIAC CATHETERIZATION     had in [place] around 2012   CATARACT EXTRACTION W/ INTRAOCULAR LENS  IMPLANT, BILATERAL     CESAREAN SECTION     COLONOSCOPY W/ BIOPSIES AND POLYPECTOMY     MULTIPLE TOOTH EXTRACTIONS     TUBAL LIGATION      Family  History  Problem Relation Age of Onset   Heart attack Mother    Heart attack Father    Hypertension Sister    Hypertension Brother    Renal Disease Brother    Heart attack Brother    Multiple myeloma Brother    Hypertension Sister    Hypertension Sister    Heart attack Sister    Social History   Socioeconomic History   Marital  status: Married    Spouse name: Not on file   Number of children: 3   Years of education: Not on file   Highest education level: Not on file  Occupational History   Occupation: Retired Therapist, sports  Tobacco Use   Smoking status: Former    Packs/day: 0.50    Types: Cigarettes    Quit date: 10/02/2015    Years since quitting: 7.2   Smokeless tobacco: Never  Vaping Use   Vaping Use: Never used  Substance and Sexual Activity   Alcohol use: No   Drug use: No   Sexual activity: Not on file  Other Topics Concern   Not on file  Social History Narrative   Not on file   Social Determinants of Health   Financial Resource Strain: Not on file  Food Insecurity: No Food Insecurity (03/21/2022)   Hunger Vital Sign    Worried About Running Out of Food in the Last Year: Never true    Ran Out of Food in the Last Year: Never true  Transportation Needs: No Transportation Needs (03/21/2022)   PRAPARE - Hydrologist (Medical): No    Lack of Transportation (Non-Medical): No  Physical Activity: Insufficiently Active (03/21/2022)   Exercise Vital Sign    Days of Exercise per Week: 3 days    Minutes of Exercise per Session: 30 min  Stress: No Stress Concern Present (03/21/2022)   [place]    Feeling of Stress : Not at all  Social Connections: Not on file    Objective:  BP 136/78 (BP Location: Left Arm, Patient Position: Sitting)   Pulse 75   Temp (!) 97 F (36.1 C) (Temporal)   Ht '5\' 5"'$  (1.651 m)   Wt 146 lb (66.2 kg)   SpO2 98%   BMI 24.30 kg/m      12/14/2022     8:58 AM 09/06/2022    7:44 AM 08/08/2022    1:45 PM  BP/Weight  Systolic BP 284 132 440  Diastolic BP 78 78 78  Wt. (Lbs) 146 147.2 143.8  BMI 24.3 kg/m2 24.5 kg/m2 25.47 kg/m2    Physical Exam Vitals reviewed.  Constitutional:      Appearance: Normal appearance. She is normal weight.  Neck:     Vascular: No carotid bruit.  Cardiovascular:     Rate and Rhythm: Normal rate and regular rhythm.     Heart sounds: Normal heart sounds.  Pulmonary:     Effort: Pulmonary effort is normal.     Breath sounds: Normal breath sounds.  Abdominal:     General: Abdomen is flat. Bowel sounds are normal.     Palpations: Abdomen is soft.     Tenderness: There is no abdominal tenderness.  Neurological:     Mental Status: She is alert and oriented to person, place, and time.  Psychiatric:        Mood and Affect: Mood normal.        Behavior: Behavior normal.     Diabetic Foot Exam - Simple   Simple Foot Form  12/14/2022  8:12 PM  Visual Inspection No deformities, no ulcerations, no other skin breakdown bilaterally: Yes Sensation Testing Intact to touch and monofilament testing bilaterally: Yes Pulse Check Posterior Tibialis and Dorsalis pulse intact bilaterally: Yes Comments      Lab Results  Component Value Date   WBC 8.8 09/06/2022   HGB 14.2 09/06/2022   HCT 41.3  09/06/2022   PLT 187 09/06/2022   GLUCOSE 96 09/06/2022   CHOL 151 09/06/2022   TRIG 114 09/06/2022   HDL 68 09/06/2022   LDLCALC 63 09/06/2022   ALT 42 (H) 09/06/2022   AST 40 09/06/2022   NA 140 09/06/2022   K 4.1 09/06/2022   CL 101 09/06/2022   CREATININE 1.50 (H) 09/06/2022   BUN 19 09/06/2022   CO2 25 09/06/2022   TSH 6.270 (H) 12/05/2022   HGBA1C 5.9 (H) 09/06/2022   MICROALBUR 150 10/17/2021      Assessment & Plan:    Paroxysmal atrial fibrillation ([place]) Assessment & Plan: On metoprolol xl 100 mg 2 daily.    Chronic idiopathic gout involving toe of right foot without tophus Assessment &  Plan: The current medical regimen is effective;  continue present plan and medications. Continue allopurinol 100 mg daily.  Has colchicine for flare ups.    Diabetic glomerulopathy [facility]) Assessment & Plan: Control: good Recommend check feet daily. Recommend annual eye exams. Medicines: Start kerendia 10 mg daily. Recheck cmp in 4 weeks.  Continue to work on eating a healthy diet and exercise.  Labs drawn today.     Orders: -     Hemoglobin A1c -     Microalbumin / creatinine urine ratio  Mixed hyperlipidemia Assessment & Plan: Well controlled.  No changes to medicines. rosuvastatin 40 mg daily and on vascepa 1 gm 2 capsules twice daily.  Continue to work on eating a healthy diet and exercise.  Labs drawn today.    Orders: -     Lipid panel  Abdominal aortic atherosclerosis ([place]) Assessment & Plan: The current medical regimen is effective;  continue present plan and medications. Continue crestor and aspirin.    Secondary DM with CKD stage 4 and hypertension ([place]) Assessment & Plan: Diabetes well controlled  Recommend check feet daily. Recommend annual eye exams. Medicines: Start kerendia 10 mg daily. Recheck cmp in 4 weeks when gets thyroid levels.  Continue to work on eating a healthy diet and exercise.   Well controlled hypertension. No changes to medicines. Continue lisinopril 20 mg daily, metoprolol succinate 200 mg once daily, chlorthalidone 25 mg daily, amlodipine 10 mg daily, aspirin 81 mg daily.  Labs drawn today.    Orders: -     CBC with Differential/Platelet -     Comprehensive metabolic panel -     Comprehensive metabolic panel; Future  Chronic pain syndrome Assessment & Plan: Secondary to chronic back pain.  Continue tramadol.   Post-surgical hypothyroidism Assessment & Plan: Just changed synthroid to 112 mcg daily.  Needs to return in 6 weeks for tsh, free T4.  Orders: -     T4, free; Future -     TSH; Future     No orders of the  defined types were placed in this encounter.   Orders Placed This Encounter  Procedures   CBC with Differential/Platelet   Comprehensive metabolic panel   Hemoglobin A1c   Lipid panel   Microalbumin / creatinine urine ratio   T4, free   TSH   Comprehensive metabolic panel     Follow-up: Return in about 3 months (around 03/14/2023) for chronic fasting.  An After Visit Summary was printed and given to the patient.   I,Lauren M Auman,acting as a scribe for Rochel Brome, MD.,have documented all relevant documentation on the behalf of Rochel Brome, MD,as directed by  Rochel Brome, MD while in the presence of Rochel Brome, MD.  Rochel Brome, MD [facility] [phone]

## 2022-12-14 NOTE — Assessment & Plan Note (Signed)
Start kerendia 10 mg daily. Recheck cmp in 4 weeks when gets thyroid levels.

## 2022-12-14 NOTE — Assessment & Plan Note (Signed)
The current medical regimen is effective;  continue present plan and medications.  

## 2022-12-14 NOTE — Assessment & Plan Note (Signed)
Well controlled.  No changes to medicines. rosuvastatin 40 mg daily and on vascepa 1 gm 2 capsules twice daily.  Continue to work on eating a healthy diet and exercise.  Labs drawn today.

## 2022-12-14 NOTE — Assessment & Plan Note (Signed)
Check labs 

## 2022-12-16 ENCOUNTER — Encounter: Payer: Self-pay | Admitting: Family Medicine

## 2022-12-16 DIAGNOSIS — E1121 Type 2 diabetes mellitus with diabetic nephropathy: Secondary | ICD-10-CM | POA: Insufficient documentation

## 2022-12-16 DIAGNOSIS — E1322 Other specified diabetes mellitus with diabetic chronic kidney disease: Secondary | ICD-10-CM | POA: Insufficient documentation

## 2022-12-16 DIAGNOSIS — I129 Hypertensive chronic kidney disease with stage 1 through stage 4 chronic kidney disease, or unspecified chronic kidney disease: Secondary | ICD-10-CM

## 2022-12-16 DIAGNOSIS — E890 Postprocedural hypothyroidism: Secondary | ICD-10-CM

## 2022-12-16 HISTORY — DX: Type 2 diabetes mellitus with diabetic nephropathy: E11.21

## 2022-12-16 HISTORY — DX: Hypertensive chronic kidney disease with stage 1 through stage 4 chronic kidney disease, or unspecified chronic kidney disease: I12.9

## 2022-12-16 HISTORY — DX: Postprocedural hypothyroidism: E89.0

## 2022-12-16 HISTORY — DX: Other specified diabetes mellitus with diabetic chronic kidney disease: E13.22

## 2022-12-16 NOTE — Assessment & Plan Note (Signed)
Secondary to chronic back pain.  Continue tramadol.

## 2022-12-16 NOTE — Assessment & Plan Note (Signed)
Just changed synthroid to 112 mcg daily.  Needs to return in 6 weeks for tsh, free T4.

## 2022-12-16 NOTE — Assessment & Plan Note (Signed)
Control: good Recommend check sugars fasting daily. Recommend check feet daily. Recommend annual eye exams. Medicines: none Continue to work on eating a healthy diet and exercise.  Labs drawn today.    

## 2022-12-16 NOTE — Assessment & Plan Note (Signed)
The current medical regimen is effective;  continue present plan and medications. Continue crestor and aspirin.

## 2022-12-16 NOTE — Assessment & Plan Note (Signed)
Control: good Recommend check feet daily. Recommend annual eye exams. Medicines: Start kerendia 10 mg daily. Recheck cmp in 4 weeks.  Continue to work on eating a healthy diet and exercise.  Labs drawn today.

## 2022-12-16 NOTE — Assessment & Plan Note (Addendum)
Diabetes well controlled  Recommend check feet daily. Recommend annual eye exams. Medicines: Start kerendia 10 mg daily. Recheck cmp in 4 weeks when gets thyroid levels.  Continue to work on eating a healthy diet and exercise.   Well controlled hypertension. No changes to medicines. Continue lisinopril 20 mg daily, metoprolol succinate 200 mg once daily, chlorthalidone 25 mg daily, amlodipine 10 mg daily, aspirin 81 mg daily.  Labs drawn today.

## 2022-12-18 ENCOUNTER — Other Ambulatory Visit: Payer: Self-pay | Admitting: Family Medicine

## 2022-12-18 LAB — LIPID PANEL
Chol/HDL Ratio: 2.6 ratio (ref 0.0–4.4)
Cholesterol, Total: 152 mg/dL (ref 100–199)
HDL: 59 mg/dL (ref >39)
LDL Chol Calc (NIH): 72 mg/dL (ref 0–99)
Triglycerides: 118 mg/dL (ref 0–149)
VLDL Cholesterol Cal: 21 mg/dL (ref 5–40)

## 2022-12-18 LAB — CBC WITH DIFFERENTIAL/PLATELET
Basophils Absolute: 0.1 10*3/uL (ref 0.0–0.2)
Basos: 0 %
EOS (ABSOLUTE): 0.4 10*3/uL (ref 0.0–0.4)
Eos: 4 %
Hematocrit: 41.8 % (ref 34.0–46.6)
Hemoglobin: 13.7 g/dL (ref 11.1–15.9)
Immature Grans (Abs): 0 10*3/uL (ref 0.0–0.1)
Immature Granulocytes: 0 %
Lymphocytes Absolute: 2.7 10*3/uL (ref 0.7–3.1)
Lymphs: 23 %
MCH: 30.4 pg (ref 26.6–33.0)
MCHC: 32.8 g/dL (ref 31.5–35.7)
MCV: 93 fL (ref 79–97)
Monocytes Absolute: 1.2 10*3/uL — ABNORMAL HIGH (ref 0.1–0.9)
Monocytes: 10 %
Neutrophils Absolute: 7.5 10*3/uL — ABNORMAL HIGH (ref 1.4–7.0)
Neutrophils: 63 %
Platelets: 192 10*3/uL (ref 150–450)
RBC: 4.5 x10E6/uL (ref 3.77–5.28)
RDW: 12.5 % (ref 11.7–15.4)
WBC: 11.8 10*3/uL — ABNORMAL HIGH (ref 3.4–10.8)

## 2022-12-18 LAB — COMPREHENSIVE METABOLIC PANEL
ALT: 24 IU/L (ref 0–32)
AST: 28 IU/L (ref 0–40)
Albumin/Globulin Ratio: 1.8 (ref 1.2–2.2)
Albumin: 4.4 g/dL (ref 3.8–4.8)
Alkaline Phosphatase: 104 IU/L (ref 44–121)
BUN/Creatinine Ratio: 18 (ref 12–28)
BUN: 27 mg/dL (ref 8–27)
Bilirubin Total: 0.3 mg/dL (ref 0.0–1.2)
CO2: 25 mmol/L (ref 20–29)
Calcium: 9.6 mg/dL (ref 8.7–10.3)
Chloride: 101 mmol/L (ref 96–106)
Creatinine, Ser: 1.54 mg/dL — ABNORMAL HIGH (ref 0.57–1.00)
Globulin, Total: 2.5 g/dL (ref 1.5–4.5)
Glucose: 112 mg/dL — ABNORMAL HIGH (ref 70–99)
Potassium: 4.2 mmol/L (ref 3.5–5.2)
Sodium: 142 mmol/L (ref 134–144)
Total Protein: 6.9 g/dL (ref 6.0–8.5)
eGFR: 34 mL/min/{1.73_m2} — ABNORMAL LOW (ref >59)

## 2022-12-18 LAB — MICROALBUMIN / CREATININE URINE RATIO
Creatinine, Urine: 139.8 mg/dL
Microalb/Creat Ratio: 15 mg/g creat (ref 0–29)
Microalbumin, Urine: 20.8 ug/mL

## 2022-12-18 LAB — HEMOGLOBIN A1C
Est. average glucose Bld gHb Est-mCnc: 123 mg/dL
Hgb A1c MFr Bld: 5.9 % — ABNORMAL HIGH (ref 4.8–5.6)

## 2022-12-18 LAB — CARDIOVASCULAR RISK ASSESSMENT

## 2022-12-18 NOTE — Progress Notes (Signed)
Blood count abnormal. Wbc elevated. Liver function normal.  Kidney function abnormal, but stable. I recommended kerendia 10 mg daily at her appt.  Cholesterol: good HBA1C: 5.9 Not spilling protein in urine.

## 2022-12-26 ENCOUNTER — Other Ambulatory Visit: Payer: Self-pay

## 2022-12-26 MED ORDER — KERENDIA 10 MG PO TABS: 10.0000 mg | ORAL_TABLET | Freq: Every day | ORAL | 3 refills | Status: DC

## 2023-01-07 ENCOUNTER — Other Ambulatory Visit: Payer: Self-pay | Admitting: Family Medicine

## 2023-01-10 ENCOUNTER — Ambulatory Visit: Payer: Medicare Other

## 2023-01-10 DIAGNOSIS — E1322 Other specified diabetes mellitus with diabetic chronic kidney disease: Secondary | ICD-10-CM

## 2023-01-10 DIAGNOSIS — I129 Hypertensive chronic kidney disease with stage 1 through stage 4 chronic kidney disease, or unspecified chronic kidney disease: Secondary | ICD-10-CM | POA: Diagnosis not present

## 2023-01-10 DIAGNOSIS — N184 Chronic kidney disease, stage 4 (severe): Secondary | ICD-10-CM | POA: Diagnosis not present

## 2023-01-10 DIAGNOSIS — E890 Postprocedural hypothyroidism: Secondary | ICD-10-CM | POA: Diagnosis not present

## 2023-01-11 ENCOUNTER — Other Ambulatory Visit: Payer: Self-pay

## 2023-01-11 LAB — COMPREHENSIVE METABOLIC PANEL
ALT: 18 IU/L (ref 0–32)
AST: 24 IU/L (ref 0–40)
Albumin/Globulin Ratio: 1.9 (ref 1.2–2.2)
Albumin: 4.2 g/dL (ref 3.8–4.8)
Alkaline Phosphatase: 91 IU/L (ref 44–121)
BUN/Creatinine Ratio: 15 (ref 12–28)
BUN: 25 mg/dL (ref 8–27)
Bilirubin Total: 0.3 mg/dL (ref 0.0–1.2)
CO2: 24 mmol/L (ref 20–29)
Calcium: 9.4 mg/dL (ref 8.7–10.3)
Chloride: 102 mmol/L (ref 96–106)
Creatinine, Ser: 1.68 mg/dL — ABNORMAL HIGH (ref 0.57–1.00)
Globulin, Total: 2.2 g/dL (ref 1.5–4.5)
Glucose: 146 mg/dL — ABNORMAL HIGH (ref 70–99)
Potassium: 3.4 mmol/L — ABNORMAL LOW (ref 3.5–5.2)
Sodium: 142 mmol/L (ref 134–144)
Total Protein: 6.4 g/dL (ref 6.0–8.5)
eGFR: 31 mL/min/{1.73_m2} — ABNORMAL LOW (ref >59)

## 2023-01-11 LAB — TSH: TSH: 0.472 u[IU]/mL (ref 0.450–4.500)

## 2023-01-11 LAB — T4, FREE: Free T4: 1.71 ng/dL (ref 0.82–1.77)

## 2023-01-11 MED ORDER — POTASSIUM CHLORIDE CRYS ER 10 MEQ PO TBCR: 10.0000 meq | EXTENDED_RELEASE_TABLET | Freq: Every day | ORAL | 0 refills | Status: DC

## 2023-01-24 ENCOUNTER — Other Ambulatory Visit: Payer: Self-pay

## 2023-01-24 MED ORDER — LEVOTHYROXINE SODIUM 112 MCG PO TABS: 112.0000 ug | ORAL_TABLET | Freq: Every day | ORAL | 0 refills | Status: DC

## 2023-02-21 ENCOUNTER — Other Ambulatory Visit: Payer: Self-pay | Admitting: Family Medicine

## 2023-02-26 ENCOUNTER — Other Ambulatory Visit: Payer: Self-pay | Admitting: Family Medicine

## 2023-03-10 ENCOUNTER — Other Ambulatory Visit: Payer: Self-pay | Admitting: Physician Assistant

## 2023-03-10 ENCOUNTER — Other Ambulatory Visit: Payer: Self-pay | Admitting: Family Medicine

## 2023-03-10 DIAGNOSIS — K219 Gastro-esophageal reflux disease without esophagitis: Secondary | ICD-10-CM

## 2023-03-17 ENCOUNTER — Other Ambulatory Visit: Payer: Self-pay

## 2023-03-17 DIAGNOSIS — E0590 Thyrotoxicosis, unspecified without thyrotoxic crisis or storm: Secondary | ICD-10-CM

## 2023-03-17 DIAGNOSIS — E1121 Type 2 diabetes mellitus with diabetic nephropathy: Secondary | ICD-10-CM

## 2023-03-17 DIAGNOSIS — M1A0710 Idiopathic chronic gout, right ankle and foot, without tophus (tophi): Secondary | ICD-10-CM

## 2023-03-17 DIAGNOSIS — E782 Mixed hyperlipidemia: Secondary | ICD-10-CM

## 2023-03-17 DIAGNOSIS — N1832 Chronic kidney disease, stage 3b: Secondary | ICD-10-CM

## 2023-03-19 ENCOUNTER — Other Ambulatory Visit: Payer: Medicare Other

## 2023-03-19 ENCOUNTER — Other Ambulatory Visit: Payer: Self-pay | Admitting: Family Medicine

## 2023-03-19 DIAGNOSIS — E782 Mixed hyperlipidemia: Secondary | ICD-10-CM

## 2023-03-19 DIAGNOSIS — E0590 Thyrotoxicosis, unspecified without thyrotoxic crisis or storm: Secondary | ICD-10-CM

## 2023-03-19 DIAGNOSIS — E1121 Type 2 diabetes mellitus with diabetic nephropathy: Secondary | ICD-10-CM | POA: Diagnosis not present

## 2023-03-19 DIAGNOSIS — E041 Nontoxic single thyroid nodule: Secondary | ICD-10-CM

## 2023-03-19 DIAGNOSIS — M1A0710 Idiopathic chronic gout, right ankle and foot, without tophus (tophi): Secondary | ICD-10-CM | POA: Diagnosis not present

## 2023-03-19 DIAGNOSIS — N1832 Hypertensive chronic kidney disease with stage 1 through stage 4 chronic kidney disease, or unspecified chronic kidney disease: Secondary | ICD-10-CM

## 2023-03-19 DIAGNOSIS — I129 Hypertensive chronic kidney disease with stage 1 through stage 4 chronic kidney disease, or unspecified chronic kidney disease: Secondary | ICD-10-CM | POA: Diagnosis not present

## 2023-03-19 DIAGNOSIS — K219 Gastro-esophageal reflux disease without esophagitis: Secondary | ICD-10-CM

## 2023-03-20 ENCOUNTER — Encounter: Payer: Self-pay | Admitting: Family Medicine

## 2023-03-20 LAB — CBC WITH DIFFERENTIAL/PLATELET
Basophils Absolute: 0.1 10*3/uL (ref 0.0–0.2)
Basos: 1 %
EOS (ABSOLUTE): 0.7 10*3/uL — ABNORMAL HIGH (ref 0.0–0.4)
Eos: 7 %
Hematocrit: 41.8 % (ref 34.0–46.6)
Hemoglobin: 14 g/dL (ref 11.1–15.9)
Immature Grans (Abs): 0 10*3/uL (ref 0.0–0.1)
Immature Granulocytes: 0 %
Lymphocytes Absolute: 2.5 10*3/uL (ref 0.7–3.1)
Lymphs: 26 %
MCH: 31.2 pg (ref 26.6–33.0)
MCHC: 33.5 g/dL (ref 31.5–35.7)
MCV: 93 fL (ref 79–97)
Monocytes Absolute: 0.8 10*3/uL (ref 0.1–0.9)
Monocytes: 9 %
Neutrophils Absolute: 5.3 10*3/uL (ref 1.4–7.0)
Neutrophils: 57 %
Platelets: 187 10*3/uL (ref 150–450)
RBC: 4.49 x10E6/uL (ref 3.77–5.28)
RDW: 13.2 % (ref 11.7–15.4)
WBC: 9.3 10*3/uL (ref 3.4–10.8)

## 2023-03-20 LAB — COMPREHENSIVE METABOLIC PANEL
ALT: 26 IU/L (ref 0–32)
AST: 28 IU/L (ref 0–40)
Albumin/Globulin Ratio: 1.8 (ref 1.2–2.2)
Albumin: 4.3 g/dL (ref 3.8–4.8)
Alkaline Phosphatase: 104 IU/L (ref 44–121)
BUN/Creatinine Ratio: 12 (ref 12–28)
BUN: 20 mg/dL (ref 8–27)
Bilirubin Total: 0.5 mg/dL (ref 0.0–1.2)
CO2: 23 mmol/L (ref 20–29)
Calcium: 9.6 mg/dL (ref 8.7–10.3)
Chloride: 100 mmol/L (ref 96–106)
Creatinine, Ser: 1.67 mg/dL — ABNORMAL HIGH (ref 0.57–1.00)
Globulin, Total: 2.4 g/dL (ref 1.5–4.5)
Glucose: 104 mg/dL — ABNORMAL HIGH (ref 70–99)
Potassium: 4.2 mmol/L (ref 3.5–5.2)
Sodium: 141 mmol/L (ref 134–144)
Total Protein: 6.7 g/dL (ref 6.0–8.5)
eGFR: 31 mL/min/{1.73_m2} — ABNORMAL LOW (ref >59)

## 2023-03-20 LAB — LIPID PANEL
Chol/HDL Ratio: 2.9 ratio (ref 0.0–4.4)
Cholesterol, Total: 171 mg/dL (ref 100–199)
HDL: 58 mg/dL (ref >39)
LDL Chol Calc (NIH): 87 mg/dL (ref 0–99)
Triglycerides: 149 mg/dL (ref 0–149)
VLDL Cholesterol Cal: 26 mg/dL (ref 5–40)

## 2023-03-20 LAB — HEMOGLOBIN A1C
Est. average glucose Bld gHb Est-mCnc: 120 mg/dL
Hgb A1c MFr Bld: 5.8 % — ABNORMAL HIGH (ref 4.8–5.6)

## 2023-03-20 LAB — URIC ACID: Uric Acid: 7.5 mg/dL (ref 3.1–7.9)

## 2023-03-20 LAB — CARDIOVASCULAR RISK ASSESSMENT

## 2023-03-20 LAB — TSH: TSH: 0.949 u[IU]/mL (ref 0.450–4.500)

## 2023-03-21 ENCOUNTER — Ambulatory Visit (INDEPENDENT_AMBULATORY_CARE_PROVIDER_SITE_OTHER): Payer: Medicare Other | Admitting: Family Medicine

## 2023-03-21 ENCOUNTER — Encounter: Payer: Self-pay | Admitting: Family Medicine

## 2023-03-21 VITALS — BP 118/80 | HR 96 | Temp 97.1°F | Ht 65.0 in | Wt 143.0 lb

## 2023-03-21 DIAGNOSIS — I700 Atherosclerosis of aorta: Secondary | ICD-10-CM | POA: Diagnosis not present

## 2023-03-21 DIAGNOSIS — E890 Postprocedural hypothyroidism: Secondary | ICD-10-CM | POA: Diagnosis not present

## 2023-03-21 DIAGNOSIS — E782 Mixed hyperlipidemia: Secondary | ICD-10-CM | POA: Diagnosis not present

## 2023-03-21 DIAGNOSIS — E1122 Type 2 diabetes mellitus with diabetic chronic kidney disease: Secondary | ICD-10-CM | POA: Diagnosis not present

## 2023-03-21 DIAGNOSIS — E1121 Type 2 diabetes mellitus with diabetic nephropathy: Secondary | ICD-10-CM

## 2023-03-21 DIAGNOSIS — N184 Chronic kidney disease, stage 4 (severe): Secondary | ICD-10-CM | POA: Diagnosis not present

## 2023-03-21 DIAGNOSIS — I129 Hypertensive chronic kidney disease with stage 1 through stage 4 chronic kidney disease, or unspecified chronic kidney disease: Secondary | ICD-10-CM

## 2023-03-21 DIAGNOSIS — E0590 Thyrotoxicosis, unspecified without thyrotoxic crisis or storm: Secondary | ICD-10-CM

## 2023-03-21 DIAGNOSIS — E1322 Other specified diabetes mellitus with diabetic chronic kidney disease: Secondary | ICD-10-CM

## 2023-03-21 DIAGNOSIS — H8113 Benign paroxysmal vertigo, bilateral: Secondary | ICD-10-CM

## 2023-03-21 HISTORY — DX: Benign paroxysmal vertigo, bilateral: H81.13

## 2023-03-21 MED ORDER — MECLIZINE HCL 25 MG PO TABS
25.0000 mg | ORAL_TABLET | Freq: Three times a day (TID) | ORAL | 2 refills | Status: AC | PRN
Start: 2023-03-21 — End: ?

## 2023-03-21 NOTE — Assessment & Plan Note (Signed)
Diabetes well controlled  Recommend check feet daily. Recommend annual eye exams.  Well controlled hypertension. No changes to medicines. Continue lisinopril 20 mg daily, metoprolol succinate 200 mg once daily, chlorthalidone 25 mg daily, amlodipine 10 mg daily, aspirin 81 mg daily.  Labs drawn today.

## 2023-03-21 NOTE — Progress Notes (Signed)
Subjective:  Patient ID: Pamela Schwartz, female    DOB: 04-02-1941  Age: 82 y.o. MRN: [id number]  Chief Complaint  Patient presents with   Medical Management of Chronic Issues    HPI   HPI: Diabetes:  Complications: CKD and glomerulopathy.  Glucose checking: not checking Most recent A1C: 5.8 Current medications: none.  Last Eye Exam: 2023 Foot checks:daily   Hyperlipidemia: Current medications: On rosuvastatin 40 mg daily and on vascepa 1 gm 2 capsules twice daily.    Hypertensive CKD: Complications: Last creatinine was 1.67 Current medications: lisinopril 20 mg daily, metoprolol succinate 200 mg once daily, chlorthalidone 25 mg daily, amlodipine 10 mg daily, aspirin 81 mg daily.  Patient has seen nephrology.    Atrial fibrillation related to acute illness: on toprol xl 100 mg twice daily. No blood thinners recommended.    Diet: healthy Exercise: active   Back Pain: on tramadol 50 mg twice daily.  Ambien cr 12.5 mg one before bed.   Right lower ankle swelling x 2 weeks, denies pain.         03/21/2023    8:37 AM 03/21/2022    3:34 PM 10/17/2021    7:41 AM 07/13/2021    7:41 AM 04/06/2021   10:43 AM  Depression screen PHQ 2/9  Decreased Interest 0 0 0 0 0  Down, Depressed, Hopeless 0 0 0 0 0  PHQ - 2 Score 0 0 0 0 0        03/21/2023    8:37 AM  Fall Risk   Falls in the past year? 0  Number falls in past yr: 0  Injury with Fall? 0  Risk for fall due to : No Fall Risks  Follow up Falls evaluation completed    Patient Care Team: Blane Ohara, MD as PCP - General (Family Medicine) Tyler Pita, MD as Attending Physician (Internal Medicine) Estill Bamberg, MD as Consulting Physician (Orthopedic Surgery) Claria Dice, MD as Attending Physician (Physical Medicine and Rehabilitation) Park Liter, DPM (Inactive) as Consulting Physician (Podiatry)   Review of Systems  Constitutional:  Negative for chills, fatigue and fever.  HENT:  Negative for congestion,  ear pain, rhinorrhea and sore throat.   Respiratory:  Negative for cough and shortness of breath.   Cardiovascular:  Positive for leg swelling (rt lower leg). Negative for chest pain.  Gastrointestinal:  Positive for diarrhea. Negative for abdominal pain, constipation, nausea and vomiting.  Genitourinary:  Negative for dysuria and urgency.  Musculoskeletal:  Negative for back pain and myalgias.  Neurological:  Positive for dizziness. Negative for weakness, light-headedness and headaches.  Psychiatric/Behavioral:  Negative for dysphoric mood. The patient is not nervous/anxious.     Current Outpatient Medications on File Prior to Visit  Medication Sig Dispense Refill   allopurinol (ZYLOPRIM) 100 MG tablet Take 1 tablet (100 mg total) by mouth every evening. 90 tablet 1   amLODipine (NORVASC) 10 MG tablet TAKE ONE TABLET BY MOUTH EVERY EVENING 90 tablet 1   aspirin 81 MG tablet Take 81 mg by mouth daily.     chlorthalidone (HYGROTON) 25 MG tablet TAKE ONE TABLET BY MOUTH EVERY DAY 90 tablet 1   Finerenone (KERENDIA) 10 MG TABS Take 1 tablet (10 mg total) by mouth daily. 30 tablet 3   levothyroxine (SYNTHROID) 112 MCG tablet Take 1 tablet (112 mcg total) by mouth daily. 90 tablet 0   lisinopril (ZESTRIL) 20 MG tablet TAKE ONE TABLET BY MOUTH EVERY DAY 90 tablet 1  metoprolol succinate (TOPROL-XL) 100 MG 24 hr tablet TAKE 2 TABLETS BY MOUTH EVERY DAY 180 tablet 1   ondansetron (ZOFRAN-ODT) 8 MG disintegrating tablet Take 1 tablet (8 mg total) by mouth every 8 (eight) hours as needed for nausea or vomiting. 30 tablet 0   pantoprazole (PROTONIX) 40 MG tablet TAKE ONE TABLET BY MOUTH EVERY EVENING 90 tablet 3   potassium chloride (KLOR-CON) 10 MEQ tablet Take 1 tablet (10 mEq total) by mouth daily. 90 tablet 1   rosuvastatin (CRESTOR) 40 MG tablet TAKE ONE TABLET BY MOUTH EVERY EVENING 90 tablet 1   traMADol (ULTRAM) 50 MG tablet Take 1 tablet (50 mg total) by mouth every 8 (eight) hours as needed  for back pain (moderate pain or severe pain). 90 tablet 1   VASCEPA 1 g capsule TAKE 2 CAPSULES BY MOUTH TWICE DAILY 360 capsule 1   zolpidem (AMBIEN CR) 12.5 MG CR tablet Take 1 tablet (12.5 mg total) by mouth at bedtime. 30 tablet 5   No current facility-administered medications on file prior to visit.   Past Medical History:  Diagnosis Date   Age-related osteoporosis without current pathological fracture    Arthritis    Barrett's esophagus without dysplasia    Cancer (HCC)    denies   Chronic kidney disease    Closed fracture of maxillary sinus (HCC) 07/23/2021   Colitis 05/25/2020   Colitis 05/25/2020   GERD (gastroesophageal reflux disease)    Hearing loss    History of COVID-19    Hypercholesterolemia    Hypertension    Hypertensive chronic kidney disease with stage 1 through stage 4 chronic kidney disease, or unspecified chronic kidney disease    Hyperthyroidism    IBS (irritable bowel syndrome)    Impaired fasting glucose    Low back pain    Mixed hyperlipidemia    Paresthesia of skin    Primary insomnia    Sacroiliac inflammation (HCC) 03/02/2020   Scars    Thyroid cancer (HCC) 09/05/2022   Trochanteric bursitis of left hip 09/29/2020   Vitamin D deficiency, unspecified    Past Surgical History:  Procedure Laterality Date   ANTERIOR CERVICAL DECOMP/DISCECTOMY FUSION N/A 03/06/2016   Procedure: ACDF - C5-C6 - C6-C7  ;  Surgeon: Julio Sicks, MD;  Location: [facility];  Service: Neurosurgery;  Laterality: N/A;  ACDF - C5-C6 - C6-C7     APPENDECTOMY     BUNIONECTOMY     right and left   CARDIAC CATHETERIZATION     had in [place] around 2012   CATARACT EXTRACTION W/ INTRAOCULAR LENS  IMPLANT, BILATERAL     CESAREAN SECTION     COLONOSCOPY W/ BIOPSIES AND POLYPECTOMY     MULTIPLE TOOTH EXTRACTIONS     TUBAL LIGATION      Family History  Problem Relation Age of Onset   Heart attack Mother    Heart attack Father    Hypertension Sister    Hypertension Brother     Renal Disease Brother    Heart attack Brother    Multiple myeloma Brother    Hypertension Sister    Hypertension Sister    Heart attack Sister    Social History   Socioeconomic History   Marital status: Married    Spouse name: Not on file   Number of children: 3   Years of education: Not on file   Highest education level: Not on file  Occupational History   Occupation: Retired Char[employer] fundraiser  Tobacco Use  Smoking status: Former    Packs/day: .5    Types: Cigarettes    Quit date: 10/02/2015    Years since quitting: 7.4   Smokeless tobacco: Never  Vaping Use   Vaping Use: Never used  Substance and Sexual Activity   Alcohol use: No   Drug use: No   Sexual activity: Not on file  Other Topics Concern   Not on file  Social History Narrative   Not on file   Social Determinants of Health   Financial Resource Strain: Low Risk  (03/21/2023)   Overall Financial Resource Strain (CARDIA)    Difficulty of Paying Living Expenses: Not hard at all  Food Insecurity: No Food Insecurity (03/21/2022)   Hunger Vital Sign    Worried About Running Out of Food in the Last Year: Never true    Ran Out of Food in the Last Year: Never true  Transportation Needs: No Transportation Needs (03/21/2022)   PRAPARE - Administrator, Civil Service (Medical): No    Lack of Transportation (Non-Medical): No  Physical Activity: Insufficiently Active (03/21/2022)   Exercise Vital Sign    Days of Exercise per Week: 3 days    Minutes of Exercise per Session: 30 min  Stress: No Stress Concern Present (03/21/2022)   [employer] of Occupational Health - Occupational Stress Questionnaire    Feeling of Stress : Not at all  Social Connections: Moderately Isolated (03/21/2023)   Social Connection and Isolation Panel [NHANES]    Frequency of Communication with Friends and Family: More than three times a week    Frequency of Social Gatherings with Friends and Family: Three times a week    Attends Religious  Services: Never    Active Member of Clubs or Organizations: No    Attends Banker Meetings: Never    Marital Status: Married    Objective:  BP 118/80   Pulse 96   Temp (!) 97.1 F (36.2 C)   Ht 5\' 5"  (1.651 m)   Wt 143 lb (64.9 kg)   SpO2 99%   BMI 23.80 kg/m      03/21/2023    8:33 AM 12/14/2022    8:58 AM 09/06/2022    7:44 AM  BP/Weight  Systolic BP 118 136 102  Diastolic BP 80 78 78  Wt. (Lbs) 143 146 147.2  BMI 23.8 kg/m2 24.3 kg/m2 24.5 kg/m2    Physical Exam Vitals reviewed.  Constitutional:      Appearance: Normal appearance. She is normal weight.  Neck:     Vascular: No carotid bruit.  Cardiovascular:     Rate and Rhythm: Normal rate and regular rhythm.     Heart sounds: Normal heart sounds.  Pulmonary:     Effort: Pulmonary effort is normal. No respiratory distress.     Breath sounds: Normal breath sounds.  Abdominal:     General: Abdomen is flat. Bowel sounds are normal.     Palpations: Abdomen is soft.     Tenderness: There is no abdominal tenderness.  Neurological:     Mental Status: She is alert and oriented to person, place, and time.     Comments: Positive epley maneuver BL.   Psychiatric:        Mood and Affect: Mood normal.        Behavior: Behavior normal.     Diabetic Foot Exam - Simple   Simple Foot Form Diabetic Foot exam was performed with the following findings: Yes 03/21/2023  9:05  AM  Visual Inspection No deformities, no ulcerations, no other skin breakdown bilaterally: Yes Sensation Testing Intact to touch and monofilament testing bilaterally: Yes Pulse Check Posterior Tibialis and Dorsalis pulse intact bilaterally: Yes Comments      Lab Results  Component Value Date   WBC 9.3 03/19/2023   HGB 14.0 03/19/2023   HCT 41.8 03/19/2023   PLT 187 03/19/2023   GLUCOSE 104 (H) 03/19/2023   CHOL 171 03/19/2023   TRIG 149 03/19/2023   HDL 58 03/19/2023   LDLCALC 87 03/19/2023   ALT 26 03/19/2023   AST 28 03/19/2023    NA 141 03/19/2023   K 4.2 03/19/2023   CL 100 03/19/2023   CREATININE 1.67 (H) 03/19/2023   BUN 20 03/19/2023   CO2 23 03/19/2023   TSH 0.949 03/19/2023   HGBA1C 5.8 (H) 03/19/2023   MICROALBUR 150 10/17/2021      Assessment & Plan:    Diabetic glomerulopathy (HCC) Assessment & Plan: Control: good Continue lisinopril/kerendia Recommend check feet daily. Recommend annual eye exams. Continue to work on eating a healthy diet and exercise.      Mixed hyperlipidemia Assessment & Plan: Well controlled.  No changes to medicines. rosuvastatin 40 mg daily and on vascepa 1 gm 2 capsules twice daily.  Continue to work on eating a healthy diet and exercise.     Post-surgical hypothyroidism Assessment & Plan: Previously well controlled Continue Synthroid at current dose  Recheck TSH and adjust Synthroid as indicated     Hypertensive kidney disease with stage 4 chronic kidney disease (HCC) Assessment & Plan: Continue lisinopril 20 mg daily and kerendia 10 mg daily.    Abdominal aortic atherosclerosis (HCC) Assessment & Plan: The current medical regimen is effective;  continue present plan and medications. Continue crestor and aspirin.    Benign positional vertigo, bilateral Assessment & Plan: Rx'd meclizine as needed Given vertigo exercises.  Orders: -     Meclizine HCl; Take 1 tablet (25 mg total) by mouth 3 (three) times daily as needed for dizziness.  Dispense: 30 tablet; Refill: 2     Meds ordered this encounter  Medications   meclizine (ANTIVERT) 25 MG tablet    Sig: Take 1 tablet (25 mg total) by mouth 3 (three) times daily as needed for dizziness.    Dispense:  30 tablet    Refill:  2    No orders of the defined types were placed in this encounter.    Follow-up: Return in about 3 months (around 06/21/2023) for chronic, fasting.   I,Katherina A Bramblett,acting as a scribe for Blane Ohara, MD.,have documented all relevant documentation on the  behalf of Blane Ohara, MD,as directed by  Blane Ohara, MD while in the presence of Blane Ohara, MD.   An After Visit Summary was printed and given to the patient.  Blane Ohara, MD [facility] [phone]

## 2023-03-21 NOTE — Assessment & Plan Note (Signed)
Rx'd meclizine as needed Given vertigo exercises.

## 2023-03-21 NOTE — Assessment & Plan Note (Addendum)
Control: good Continue lisinopril/kerendia Recommend check feet daily. Recommend annual eye exams. Continue to work on eating a healthy diet and exercise.

## 2023-03-21 NOTE — Assessment & Plan Note (Signed)
Well controlled.  No changes to medicines. rosuvastatin 40 mg daily and on vascepa 1 gm 2 capsules twice daily.  Continue to work on eating a healthy diet and exercise.

## 2023-03-21 NOTE — Patient Instructions (Signed)
Wear compression stockings

## 2023-03-21 NOTE — Assessment & Plan Note (Signed)
The current medical regimen is effective;  continue present plan and medications. Continue crestor and aspirin.  

## 2023-03-29 ENCOUNTER — Other Ambulatory Visit: Payer: Self-pay | Admitting: Family Medicine

## 2023-03-31 NOTE — Assessment & Plan Note (Addendum)
Continue lisinopril 20 mg daily and kerendia 10 mg daily.

## 2023-03-31 NOTE — Assessment & Plan Note (Signed)
Previously well controlled Continue Synthroid at current dose  Recheck TSH and adjust Synthroid as indicated   

## 2023-04-05 ENCOUNTER — Other Ambulatory Visit: Payer: Self-pay | Admitting: Family Medicine

## 2023-04-11 DIAGNOSIS — H353132 Nonexudative age-related macular degeneration, bilateral, intermediate dry stage: Secondary | ICD-10-CM | POA: Diagnosis not present

## 2023-04-22 ENCOUNTER — Other Ambulatory Visit: Payer: Self-pay | Admitting: Family Medicine

## 2023-04-22 DIAGNOSIS — M1A0710 Idiopathic chronic gout, right ankle and foot, without tophus (tophi): Secondary | ICD-10-CM

## 2023-05-03 ENCOUNTER — Other Ambulatory Visit: Payer: Self-pay | Admitting: Family Medicine

## 2023-05-07 ENCOUNTER — Other Ambulatory Visit: Payer: Self-pay | Admitting: Family Medicine

## 2023-05-17 ENCOUNTER — Other Ambulatory Visit: Payer: Self-pay | Admitting: Family Medicine

## 2023-06-01 ENCOUNTER — Other Ambulatory Visit: Payer: Self-pay | Admitting: Family Medicine

## 2023-07-06 ENCOUNTER — Other Ambulatory Visit: Payer: Self-pay | Admitting: Family Medicine

## 2023-07-13 ENCOUNTER — Other Ambulatory Visit: Payer: Self-pay | Admitting: Family Medicine

## 2023-07-17 NOTE — Assessment & Plan Note (Addendum)
Stable. well controlled.  Continue lisinopril 20 mg daily, metoprolol succinate 200 mg once daily, chlorthalidone 25 mg daily, amlodipine 10 mg daily, aspirin 81 mg daily.

## 2023-07-17 NOTE — Progress Notes (Deleted)
Diabetes:  Complications: CKD and glomerulopathy.  Glucose checking: not checking Most recent A1C: 5.8 Current medications: none.  Last Eye Exam: June 2024 Foot checks:daily   Hyperlipidemia: Current medications: On rosuvastatin 40 mg daily and on vascepa 1 gm 2 capsules twice daily.    Hypertensive CKD: Complications: Last creatinine was 1.67 Current medications: lisinopril 20 mg daily, metoprolol succinate 200 mg once daily, chlorthalidone 25 mg daily, amlodipine 10 mg daily, aspirin 81 mg daily.  Patient has seen nephrology.    Atrial fibrillation related to acute illness: on toprol xl 100 mg twice daily. No blood thinners recommended.    Diet: healthy Exercise: active   Back Pain: on tramadol 50 mg twice daily.  Ambien cr 12.5 mg one before bed.   Right lower ankle swelling x 2 weeks, denies pain.   Review of Systems  Constitutional:  Negative for chills, fever and malaise/fatigue.  HENT:  Negative for ear pain, sinus pain and sore throat.   Respiratory:  Negative for cough and shortness of breath.   Cardiovascular:  Negative for chest pain.  Musculoskeletal:  Positive for back pain. Negative for myalgias.  Neurological:  Negative for headaches.  Psychiatric/Behavioral:  Negative for depression. The patient is not nervous/anxious.    Physical Exam Vitals reviewed.  Constitutional:      Appearance: Normal appearance. She is normal weight.  Neck:     Vascular: No carotid bruit.  Cardiovascular:     Rate and Rhythm: Normal rate and regular rhythm.     Heart sounds: Normal heart sounds.  Pulmonary:     Effort: Pulmonary effort is normal. No respiratory distress.     Breath sounds: Normal breath sounds.  Abdominal:     General: Abdomen is flat. Bowel sounds are normal.     Palpations: Abdomen is soft.     Tenderness: There is no abdominal tenderness.  Neurological:     Mental Status: She is alert and oriented to person, place, and time.  Psychiatric:        Mood  and Affect: Mood normal.        Behavior: Behavior normal.

## 2023-07-17 NOTE — Assessment & Plan Note (Addendum)
Control: good Continue lisinopril/kerendia. Start Mounjaro. Recommend check feet daily. Recommend annual eye exams. Continue to work on eating a healthy diet and exercise.

## 2023-07-18 ENCOUNTER — Other Ambulatory Visit: Payer: Self-pay | Admitting: Family Medicine

## 2023-07-18 ENCOUNTER — Ambulatory Visit (INDEPENDENT_AMBULATORY_CARE_PROVIDER_SITE_OTHER): Payer: Medicare Other | Admitting: Family Medicine

## 2023-07-18 ENCOUNTER — Other Ambulatory Visit: Payer: Self-pay | Admitting: Physician Assistant

## 2023-07-18 VITALS — BP 110/52 | HR 72 | Temp 96.0°F | Resp 16 | Ht 65.0 in | Wt 138.2 lb

## 2023-07-18 DIAGNOSIS — Z23 Encounter for immunization: Secondary | ICD-10-CM | POA: Diagnosis not present

## 2023-07-18 DIAGNOSIS — E782 Mixed hyperlipidemia: Secondary | ICD-10-CM

## 2023-07-18 DIAGNOSIS — I129 Hypertensive chronic kidney disease with stage 1 through stage 4 chronic kidney disease, or unspecified chronic kidney disease: Secondary | ICD-10-CM | POA: Diagnosis not present

## 2023-07-18 DIAGNOSIS — N184 Chronic kidney disease, stage 4 (severe): Secondary | ICD-10-CM

## 2023-07-18 DIAGNOSIS — E1121 Type 2 diabetes mellitus with diabetic nephropathy: Secondary | ICD-10-CM | POA: Diagnosis not present

## 2023-07-18 DIAGNOSIS — I700 Atherosclerosis of aorta: Secondary | ICD-10-CM

## 2023-07-18 DIAGNOSIS — E890 Postprocedural hypothyroidism: Secondary | ICD-10-CM | POA: Diagnosis not present

## 2023-07-18 DIAGNOSIS — I4891 Unspecified atrial fibrillation: Secondary | ICD-10-CM | POA: Diagnosis not present

## 2023-07-18 MED ORDER — TIRZEPATIDE 2.5 MG/0.5ML ~~LOC~~ SOAJ: 2.5000 mg | SUBCUTANEOUS | Status: DC

## 2023-07-19 DIAGNOSIS — M810 Age-related osteoporosis without current pathological fracture: Secondary | ICD-10-CM | POA: Insufficient documentation

## 2023-07-19 DIAGNOSIS — C801 Malignant (primary) neoplasm, unspecified: Secondary | ICD-10-CM | POA: Insufficient documentation

## 2023-07-19 DIAGNOSIS — I129 Hypertensive chronic kidney disease with stage 1 through stage 4 chronic kidney disease, or unspecified chronic kidney disease: Secondary | ICD-10-CM | POA: Insufficient documentation

## 2023-07-19 DIAGNOSIS — K219 Gastro-esophageal reflux disease without esophagitis: Secondary | ICD-10-CM | POA: Insufficient documentation

## 2023-07-19 DIAGNOSIS — H9190 Unspecified hearing loss, unspecified ear: Secondary | ICD-10-CM | POA: Insufficient documentation

## 2023-07-19 DIAGNOSIS — I10 Essential (primary) hypertension: Secondary | ICD-10-CM | POA: Insufficient documentation

## 2023-07-19 DIAGNOSIS — F5101 Primary insomnia: Secondary | ICD-10-CM | POA: Insufficient documentation

## 2023-07-19 DIAGNOSIS — E7800 Pure hypercholesterolemia, unspecified: Secondary | ICD-10-CM | POA: Insufficient documentation

## 2023-07-19 DIAGNOSIS — K589 Irritable bowel syndrome without diarrhea: Secondary | ICD-10-CM | POA: Insufficient documentation

## 2023-07-19 DIAGNOSIS — N189 Chronic kidney disease, unspecified: Secondary | ICD-10-CM | POA: Insufficient documentation

## 2023-07-19 DIAGNOSIS — L905 Scar conditions and fibrosis of skin: Secondary | ICD-10-CM | POA: Insufficient documentation

## 2023-07-19 DIAGNOSIS — R202 Paresthesia of skin: Secondary | ICD-10-CM | POA: Insufficient documentation

## 2023-07-19 DIAGNOSIS — M545 Low back pain, unspecified: Secondary | ICD-10-CM | POA: Insufficient documentation

## 2023-07-19 DIAGNOSIS — K2270 Barrett's esophagus without dysplasia: Secondary | ICD-10-CM | POA: Insufficient documentation

## 2023-07-19 DIAGNOSIS — Z8616 Personal history of COVID-19: Secondary | ICD-10-CM | POA: Insufficient documentation

## 2023-07-19 DIAGNOSIS — M1990 Unspecified osteoarthritis, unspecified site: Secondary | ICD-10-CM | POA: Insufficient documentation

## 2023-07-19 LAB — CBC WITH DIFFERENTIAL/PLATELET
Basophils Absolute: 0.1 10*3/uL (ref 0.0–0.2)
Basos: 1 %
EOS (ABSOLUTE): 0.8 10*3/uL — ABNORMAL HIGH (ref 0.0–0.4)
Eos: 6 %
Hematocrit: 41.1 % (ref 34.0–46.6)
Hemoglobin: 13.4 g/dL (ref 11.1–15.9)
Immature Grans (Abs): 0 10*3/uL (ref 0.0–0.1)
Immature Granulocytes: 0 %
Lymphocytes Absolute: 2.4 10*3/uL (ref 0.7–3.1)
Lymphs: 20 %
MCH: 30 pg (ref 26.6–33.0)
MCHC: 32.6 g/dL (ref 31.5–35.7)
MCV: 92 fL (ref 79–97)
Monocytes Absolute: 0.9 10*3/uL (ref 0.1–0.9)
Monocytes: 8 %
Neutrophils Absolute: 7.7 10*3/uL — ABNORMAL HIGH (ref 1.4–7.0)
Neutrophils: 65 %
Platelets: 208 10*3/uL (ref 150–450)
RBC: 4.46 x10E6/uL (ref 3.77–5.28)
RDW: 12.6 % (ref 11.7–15.4)
WBC: 11.9 10*3/uL — ABNORMAL HIGH (ref 3.4–10.8)

## 2023-07-19 LAB — COMPREHENSIVE METABOLIC PANEL
ALT: 29 IU/L (ref 0–32)
AST: 34 IU/L (ref 0–40)
Albumin: 4.3 g/dL (ref 3.8–4.8)
Alkaline Phosphatase: 122 IU/L — ABNORMAL HIGH (ref 44–121)
BUN/Creatinine Ratio: 15 (ref 12–28)
BUN: 25 mg/dL (ref 8–27)
Bilirubin Total: 0.4 mg/dL (ref 0.0–1.2)
CO2: 22 mmol/L (ref 20–29)
Calcium: 9.4 mg/dL (ref 8.7–10.3)
Chloride: 104 mmol/L (ref 96–106)
Creatinine, Ser: 1.65 mg/dL — ABNORMAL HIGH (ref 0.57–1.00)
Globulin, Total: 2.3 g/dL (ref 1.5–4.5)
Glucose: 105 mg/dL — ABNORMAL HIGH (ref 70–99)
Potassium: 5.2 mmol/L (ref 3.5–5.2)
Sodium: 141 mmol/L (ref 134–144)
Total Protein: 6.6 g/dL (ref 6.0–8.5)
eGFR: 31 mL/min/{1.73_m2} — ABNORMAL LOW (ref >59)

## 2023-07-19 LAB — HEMOGLOBIN A1C
Est. average glucose Bld gHb Est-mCnc: 120 mg/dL
Hgb A1c MFr Bld: 5.8 % — ABNORMAL HIGH (ref 4.8–5.6)

## 2023-07-19 LAB — LITHOLINK CKD PROGRAM

## 2023-07-20 NOTE — Assessment & Plan Note (Signed)
Well controlled.  No changes to medicines. rosuvastatin 40 mg daily and on vascepa 1 gm 2 capsules twice daily.  Continue to work on eating a healthy diet and exercise.

## 2023-07-20 NOTE — Assessment & Plan Note (Signed)
Previously well controlled Continue Synthroid at current dose  

## 2023-07-20 NOTE — Progress Notes (Unsigned)
Subjective:  Patient ID: Pamela Schwartz, female    DOB: 02-07-1941  Age: 82 y.o. MRN: [id number]  Chief Complaint  Patient presents with   Medical Management of Chronic Issues    HPI Diabetes:  Complications: CKD and glomerulopathy.  Glucose checking: not checking Most recent A1C: 5.8 Current medications: none.  Last Eye Exam: June 2024 Foot checks:daily   Hyperlipidemia: Current medications: On rosuvastatin 40 mg daily and on vascepa 1 gm 2 capsules twice daily.    Hypertensive CKD: Complications: Last GFR 31 Current medications: lisinopril 20 mg daily, metoprolol succinate 200 mg once daily, chlorthalidone 25 mg daily, amlodipine 10 mg daily, kerendia 10 mg daily, aspirin 81 mg daily.  Patient has seen nephrology.   Hypothyroidism: on synthroid 112 mcg once daily in am.    Atrial fibrillation related to acute illness: on toprol xl 100 mg twice daily. No blood thinners recommended.    Diet: healthy Exercise: active   Back Pain: on tramadol 50 mg twice daily. Decreased by nephrology. Recommended to take tylenol more if needed.   Insomnia: Ambien cr 12.5 mg one before bed.      07/18/2023    8:47 AM 03/21/2023    8:37 AM 03/21/2022    3:34 PM 10/17/2021    7:41 AM 07/13/2021    7:41 AM  Depression screen PHQ 2/9  Decreased Interest 0 0 0 0 0  Down, Depressed, Hopeless 0 0 0 0 0  PHQ - 2 Score 0 0 0 0 0        07/18/2023    8:47 AM  Fall Risk   Falls in the past year? 0  Number falls in past yr: 0  Risk for fall due to : No Fall Risks  Follow up Falls evaluation completed;Falls prevention discussed    Patient Care Team: Blane Ohara, MD as PCP - General (Family Medicine) Tyler Pita, MD as Attending Physician (Internal Medicine) Estill Bamberg, MD as Consulting Physician (Orthopedic Surgery) Claria Dice, MD as Attending Physician (Physical Medicine and Rehabilitation) Park Liter, DPM (Inactive) as Consulting Physician (Podiatry)   Review of Systems   Constitutional:  Negative for chills, fatigue and fever.  HENT:  Negative for congestion, ear pain and sore throat.   Respiratory:  Negative for cough and shortness of breath.   Cardiovascular:  Negative for chest pain and palpitations.  Gastrointestinal:  Negative for abdominal pain, constipation, diarrhea, nausea and vomiting.  Endocrine: Negative for polydipsia, polyphagia and polyuria.  Genitourinary:  Negative for difficulty urinating and dysuria.  Musculoskeletal:  Positive for back pain. Negative for arthralgias and myalgias.  Skin:  Negative for rash.  Neurological:  Negative for headaches.  Psychiatric/Behavioral:  Negative for dysphoric mood. The patient is not nervous/anxious.     Current Outpatient Medications on File Prior to Visit  Medication Sig Dispense Refill   allopurinol (ZYLOPRIM) 100 MG tablet TAKE ONE TABLET BY MOUTH EVERY EVENING 90 tablet 0   aspirin 81 MG tablet Take 81 mg by mouth daily.     KERENDIA 10 MG TABS TAKE ONE TABLET BY MOUTH EVERY DAY 30 tablet 3   lisinopril (ZESTRIL) 20 MG tablet TAKE ONE TABLET BY MOUTH EVERY DAY 90 tablet 1   meclizine (ANTIVERT) 25 MG tablet Take 1 tablet (25 mg total) by mouth 3 (three) times daily as needed for dizziness. 30 tablet 2   metoprolol succinate (TOPROL-XL) 100 MG 24 hr tablet TAKE 2 TABLETS BY MOUTH EVERY DAY 180 tablet 1  ondansetron (ZOFRAN-ODT) 8 MG disintegrating tablet Take 1 tablet (8 mg total) by mouth every 8 (eight) hours as needed for nausea or vomiting. 30 tablet 0   pantoprazole (PROTONIX) 40 MG tablet TAKE ONE TABLET BY MOUTH EVERY EVENING 90 tablet 3   promethazine (PHENERGAN) 25 MG tablet take 1 tablet by oral route 4 times per day AS NEEDED nausea/vomiting 60 tablet 3   rosuvastatin (CRESTOR) 40 MG tablet TAKE ONE TABLET BY MOUTH EVERY EVENING 90 tablet 1   traMADol (ULTRAM) 50 MG tablet Take 1 tablet (50 mg total) by mouth every 8 (eight) hours as needed for severe pain (back pain). 90 tablet 3    VASCEPA 1 g capsule TAKE 2 CAPSULES BY MOUTH TWICE DAILY 360 capsule 1   zolpidem (AMBIEN CR) 12.5 MG CR tablet TAKE ONE TABLET BY MOUTH AT BEDTIME 30 tablet 5   No current facility-administered medications on file prior to visit.   Past Medical History:  Diagnosis Date   Abdominal aortic atherosclerosis (HCC) 03/02/2020   Age-related osteoporosis without current pathological fracture    Arthritis    Barrett's esophagus without dysplasia    Benign positional vertigo, bilateral 03/21/2023   Bilateral carpal tunnel syndrome 07/03/2018   Bilateral hand pain 07/03/2018   Cancer (HCC)    denies   Cervical spinal stenosis 03/06/2016   Chronic idiopathic gout involving toe of right foot without tophus 09/29/2020   Chronic kidney disease    Chronic pain syndrome 03/11/2022   Diabetes mellitus with stage 3 chronic kidney disease (HCC) 07/17/2021   Diabetic glomerulopathy (HCC) 12/16/2022   Exostosis 07/16/2017   Flu vaccine need 07/17/2021   Foot pain, bilateral 03/02/2020   GERD (gastroesophageal reflux disease)    Hearing loss    History of COVID-19    Hypercholesterolemia    Hypertension    Hypertensive chronic kidney disease with stage 1 through stage 4 chronic kidney disease, or unspecified chronic kidney disease    Hypertensive kidney disease with stage 4 chronic kidney disease (HCC) 12/16/2022   IBS (irritable bowel syndrome)    Low back pain    Lumbar pain 03/02/2020   Mixed hyperlipidemia    Paresthesia of skin    Paroxysmal atrial fibrillation (HCC) 04/28/2022   Post-surgical hypothyroidism 12/16/2022   Primary insomnia    Scars    Secondary DM with CKD stage 4 and hypertension (HCC) 12/16/2022   Telogen effluvium 03/02/2020   Vitamin D deficiency, unspecified    Past Surgical History:  Procedure Laterality Date   ANTERIOR CERVICAL DECOMP/DISCECTOMY FUSION N/A 03/06/2016   Procedure: ACDF - C5-C6 - C6-C7  ;  Surgeon: Julio Sicks, MD;  Location: [facility];  Service:  Neurosurgery;  Laterality: N/A;  ACDF - C5-C6 - C6-C7     APPENDECTOMY     BUNIONECTOMY     right and left   CARDIAC CATHETERIZATION     had in [place] around 2012   CATARACT EXTRACTION W/ INTRAOCULAR LENS  IMPLANT, BILATERAL     CESAREAN SECTION     COLONOSCOPY W/ BIOPSIES AND POLYPECTOMY     MULTIPLE TOOTH EXTRACTIONS     TUBAL LIGATION      Family History  Problem Relation Age of Onset   Heart attack Mother    Heart attack Father    Hypertension Sister    Hypertension Brother    Renal Disease Brother    Heart attack Brother    Multiple myeloma Brother    Hypertension Sister  Hypertension Sister    Heart attack Sister    Social History   Socioeconomic History   Marital status: Married    Spouse name: Not on file   Number of children: 3   Years of education: Not on file   Highest education level: Not on file  Occupational History   Occupation: Retired Charity fundraiser  Tobacco Use   Smoking status: Former    Current packs/day: 0.00    Types: Cigarettes    Quit date: 10/02/2015    Years since quitting: 7.8   Smokeless tobacco: Never  Vaping Use   Vaping status: Never Used  Substance and Sexual Activity   Alcohol use: No   Drug use: No   Sexual activity: Not on file  Other Topics Concern   Not on file  Social History Narrative   Not on file   Social Determinants of Health   Financial Resource Strain: Low Risk  (03/21/2023)   Overall Financial Resource Strain (CARDIA)    Difficulty of Paying Living Expenses: Not hard at all  Food Insecurity: No Food Insecurity (03/21/2022)   Hunger Vital Sign    Worried About Running Out of Food in the Last Year: Never true    Ran Out of Food in the Last Year: Never true  Transportation Needs: No Transportation Needs (03/21/2022)   PRAPARE - Administrator, Civil Service (Medical): No    Lack of Transportation (Non-Medical): No  Physical Activity: Insufficiently Active (03/21/2022)   Exercise Vital Sign    Days of Exercise  per Week: 3 days    Minutes of Exercise per Session: 30 min  Stress: No Stress Concern Present (03/21/2022)   [employer] of Occupational Health - Occupational Stress Questionnaire    Feeling of Stress : Not at all  Social Connections: Moderately Isolated (03/21/2023)   Social Connection and Isolation Panel [NHANES]    Frequency of Communication with Friends and Family: More than three times a week    Frequency of Social Gatherings with Friends and Family: Three times a week    Attends Religious Services: Never    Active Member of Clubs or Organizations: No    Attends Engineer, structural: Never    Marital Status: Married    Objective:  BP (!) 110/52   Pulse 72   Temp (!) 96 F (35.6 C)   Resp 16   Ht 5\' 5"  (1.651 m)   Wt 138 lb 3.2 oz (62.7 kg)   BMI 23.00 kg/m      07/18/2023    8:43 AM 03/21/2023    8:33 AM 12/14/2022    8:58 AM  BP/Weight  Systolic BP 110 118 136  Diastolic BP 52 80 78  Wt. (Lbs) 138.2 143 146  BMI 23 kg/m2 23.8 kg/m2 24.3 kg/m2    Physical Exam Vitals reviewed.  Constitutional:      Appearance: Normal appearance. She is normal weight.  Neck:     Vascular: No carotid bruit.  Cardiovascular:     Rate and Rhythm: Normal rate and regular rhythm.     Heart sounds: Normal heart sounds.  Pulmonary:     Effort: Pulmonary effort is normal. No respiratory distress.     Breath sounds: Normal breath sounds.  Abdominal:     General: Abdomen is flat. Bowel sounds are normal.     Palpations: Abdomen is soft.     Tenderness: There is no abdominal tenderness.  Neurological:     Mental Status: She is  alert and oriented to person, place, and time.  Psychiatric:        Mood and Affect: Mood normal.        Behavior: Behavior normal.     Diabetic Foot Exam - Simple   Simple Foot Form Diabetic Foot exam was performed with the following findings: Yes 07/18/2023  8:48 AM  Visual Inspection See comments: Yes Sensation Testing Intact to touch and  monofilament testing bilaterally: Yes Pulse Check Posterior Tibialis and Dorsalis pulse intact bilaterally: Yes Comments Callous left foot.       Lab Results  Component Value Date   WBC 11.9 (H) 07/18/2023   HGB 13.4 07/18/2023   HCT 41.1 07/18/2023   PLT 208 07/18/2023   GLUCOSE 105 (H) 07/18/2023   CHOL 171 03/19/2023   TRIG 149 03/19/2023   HDL 58 03/19/2023   LDLCALC 87 03/19/2023   ALT 29 07/18/2023   AST 34 07/18/2023   NA 141 07/18/2023   K 5.2 07/18/2023   CL 104 07/18/2023   CREATININE 1.65 (H) 07/18/2023   BUN 25 07/18/2023   CO2 22 07/18/2023   TSH 0.949 03/19/2023   HGBA1C 5.8 (H) 07/18/2023   MICROALBUR 150 10/17/2021      Assessment & Plan:    Diabetic glomerulopathy (HCC) Assessment & Plan: Control: good Continue lisinopril/kerendia.  Recommend check feet daily. Recommend annual eye exams. Continue to work on eating a healthy diet and exercise.     Orders: -     Hemoglobin A1c  Hypertensive kidney disease with stage 4 chronic kidney disease (HCC) Assessment & Plan: Stable. well controlled.  Continue lisinopril 20 mg daily, metoprolol succinate 200 mg once daily, chlorthalidone 25 mg daily, amlodipine 10 mg daily, aspirin 81 mg daily.   Orders: -     CBC with Differential/Platelet -     Comprehensive metabolic panel  Mixed hyperlipidemia Assessment & Plan: Well controlled.  No changes to medicines. rosuvastatin 40 mg daily and on vascepa 1 gm 2 capsules twice daily.  Continue to work on eating a healthy diet and exercise.     Post-surgical hypothyroidism Assessment & Plan: Previously well controlled Continue Synthroid at current dose      Need for influenza vaccination -     Flu Vaccine Trivalent High Dose (Fluad)  Abdominal aortic atherosclerosis (HCC) Assessment & Plan: The current medical regimen is effective;  continue present plan and medications. Continue crestor and aspirin.    Other orders -     [facility] CKD  Program     Meds ordered this encounter  Medications   DISCONTD: tirzepatide (MOUNJARO) 2.5 MG/0.5ML Pen    Sig: Inject 2.5 mg into the skin once a week.    Orders Placed This Encounter  Procedures   Flu Vaccine Trivalent High Dose (Fluad)   CBC with Differential/Platelet   Comprehensive metabolic panel   Hemoglobin A1c   [facility] CKD [facility]     Follow-up: Return in about 3 months (around 10/17/2023) for chronic follow up.   I,Marla I Leal-Borjas,acting as a scribe for Blane Ohara, MD.,have documented all relevant documentation on the behalf of Blane Ohara, MD,as directed by  Blane Ohara, MD while in the presence of Blane Ohara, MD.   An After Visit Summary was printed and given to the patient.  Blane Ohara, MD [facility] [phone]

## 2023-07-21 ENCOUNTER — Encounter: Payer: Self-pay | Admitting: Family Medicine

## 2023-07-21 NOTE — Assessment & Plan Note (Signed)
The current medical regimen is effective;  continue present plan and medications. Continue crestor and aspirin.  

## 2023-07-22 ENCOUNTER — Other Ambulatory Visit: Payer: Self-pay | Admitting: Family Medicine

## 2023-07-22 ENCOUNTER — Ambulatory Visit: Payer: Medicare Other | Attending: Cardiology | Admitting: Cardiology

## 2023-07-22 DIAGNOSIS — W101XXA Fall (on)(from) sidewalk curb, initial encounter: Secondary | ICD-10-CM

## 2023-07-22 DIAGNOSIS — M545 Low back pain, unspecified: Secondary | ICD-10-CM

## 2023-07-22 DIAGNOSIS — Z23 Encounter for immunization: Secondary | ICD-10-CM

## 2023-07-22 DIAGNOSIS — M1A0710 Idiopathic chronic gout, right ankle and foot, without tophus (tophi): Secondary | ICD-10-CM

## 2023-07-22 DIAGNOSIS — E0590 Thyrotoxicosis, unspecified without thyrotoxic crisis or storm: Secondary | ICD-10-CM

## 2023-07-22 DIAGNOSIS — E890 Postprocedural hypothyroidism: Secondary | ICD-10-CM

## 2023-07-22 DIAGNOSIS — R10814 Left lower quadrant abdominal tenderness: Secondary | ICD-10-CM

## 2023-07-22 DIAGNOSIS — H8113 Benign paroxysmal vertigo, bilateral: Secondary | ICD-10-CM

## 2023-07-22 DIAGNOSIS — R10811 Right upper quadrant abdominal tenderness: Secondary | ICD-10-CM

## 2023-07-22 DIAGNOSIS — J181 Lobar pneumonia, unspecified organism: Secondary | ICD-10-CM

## 2023-07-22 DIAGNOSIS — L03116 Cellulitis of left lower limb: Secondary | ICD-10-CM

## 2023-07-22 DIAGNOSIS — I480 Paroxysmal atrial fibrillation: Secondary | ICD-10-CM

## 2023-07-22 DIAGNOSIS — M461 Sacroiliitis, not elsewhere classified: Secondary | ICD-10-CM

## 2023-07-22 DIAGNOSIS — E860 Dehydration: Secondary | ICD-10-CM

## 2023-07-22 DIAGNOSIS — J209 Acute bronchitis, unspecified: Secondary | ICD-10-CM

## 2023-07-22 DIAGNOSIS — E1122 Type 2 diabetes mellitus with diabetic chronic kidney disease: Secondary | ICD-10-CM

## 2023-07-22 DIAGNOSIS — G894 Chronic pain syndrome: Secondary | ICD-10-CM

## 2023-07-22 DIAGNOSIS — I129 Hypertensive chronic kidney disease with stage 1 through stage 4 chronic kidney disease, or unspecified chronic kidney disease: Secondary | ICD-10-CM

## 2023-07-22 DIAGNOSIS — E782 Mixed hyperlipidemia: Secondary | ICD-10-CM

## 2023-07-22 DIAGNOSIS — G5603 Carpal tunnel syndrome, bilateral upper limbs: Secondary | ICD-10-CM

## 2023-07-22 DIAGNOSIS — S0181XA Laceration without foreign body of other part of head, initial encounter: Secondary | ICD-10-CM

## 2023-07-22 DIAGNOSIS — I700 Atherosclerosis of aorta: Secondary | ICD-10-CM

## 2023-07-22 DIAGNOSIS — S02401A Maxillary fracture, unspecified, initial encounter for closed fracture: Secondary | ICD-10-CM

## 2023-07-22 DIAGNOSIS — R946 Abnormal results of thyroid function studies: Secondary | ICD-10-CM

## 2023-07-22 DIAGNOSIS — S1191XA Laceration without foreign body of unspecified part of neck, initial encounter: Secondary | ICD-10-CM

## 2023-07-22 DIAGNOSIS — M7062 Trochanteric bursitis, left hip: Secondary | ICD-10-CM

## 2023-07-22 DIAGNOSIS — R112 Nausea with vomiting, unspecified: Secondary | ICD-10-CM

## 2023-07-22 DIAGNOSIS — M79674 Pain in right toe(s): Secondary | ICD-10-CM

## 2023-07-22 DIAGNOSIS — C73 Malignant neoplasm of thyroid gland: Secondary | ICD-10-CM

## 2023-07-22 DIAGNOSIS — M898X9 Other specified disorders of bone, unspecified site: Secondary | ICD-10-CM

## 2023-07-22 DIAGNOSIS — Z01810 Encounter for preprocedural cardiovascular examination: Secondary | ICD-10-CM

## 2023-07-22 DIAGNOSIS — E1121 Type 2 diabetes mellitus with diabetic nephropathy: Secondary | ICD-10-CM

## 2023-07-22 DIAGNOSIS — R7301 Impaired fasting glucose: Secondary | ICD-10-CM

## 2023-07-22 DIAGNOSIS — M79641 Pain in right hand: Secondary | ICD-10-CM

## 2023-07-22 DIAGNOSIS — E876 Hypokalemia: Secondary | ICD-10-CM

## 2023-07-22 DIAGNOSIS — N184 Chronic kidney disease, stage 4 (severe): Secondary | ICD-10-CM

## 2023-07-22 DIAGNOSIS — K529 Noninfective gastroenteritis and colitis, unspecified: Secondary | ICD-10-CM

## 2023-07-22 DIAGNOSIS — M4802 Spinal stenosis, cervical region: Secondary | ICD-10-CM

## 2023-07-22 DIAGNOSIS — I10 Essential (primary) hypertension: Secondary | ICD-10-CM

## 2023-07-22 DIAGNOSIS — M79671 Pain in right foot: Secondary | ICD-10-CM

## 2023-07-22 DIAGNOSIS — L650 Telogen effluvium: Secondary | ICD-10-CM

## 2023-07-22 DIAGNOSIS — E559 Vitamin D deficiency, unspecified: Secondary | ICD-10-CM

## 2023-08-02 ENCOUNTER — Other Ambulatory Visit: Payer: Self-pay | Admitting: Family Medicine

## 2023-08-31 ENCOUNTER — Other Ambulatory Visit: Payer: Self-pay | Admitting: Family Medicine

## 2023-08-31 ENCOUNTER — Other Ambulatory Visit: Payer: Self-pay | Admitting: Physician Assistant

## 2023-08-31 DIAGNOSIS — M1A0710 Idiopathic chronic gout, right ankle and foot, without tophus (tophi): Secondary | ICD-10-CM

## 2023-10-14 DIAGNOSIS — H353132 Nonexudative age-related macular degeneration, bilateral, intermediate dry stage: Secondary | ICD-10-CM | POA: Diagnosis not present

## 2023-10-14 DIAGNOSIS — H524 Presbyopia: Secondary | ICD-10-CM | POA: Diagnosis not present

## 2023-10-14 LAB — HM DIABETES EYE EXAM

## 2023-10-22 NOTE — Progress Notes (Unsigned)
Subjective:  Patient ID: Pamela Schwartz, female    DOB: 01-10-1941  Age: 82 y.o. MRN: [id number]  Chief Complaint  Patient presents with   Medical Management of Chronic Issues    HPI History of Present Illness The patient, with a history of hypertension, hyperlipidemia, atrial fibrillation, gout, and irritable bowel syndrome (IBS), presents with positional dizziness and ongoing diarrhea. The dizziness, described as a spinning sensation, occurs primarily when the patient lies down and can be alleviated by covering the eyes. The patient has experienced this symptom before and is familiar with BPPV exercises to manage it.   The patient also reports a bout of diarrhea that started on Thanksgiving day and has persisted. The bowel movements are described as occurring primarily in the morning, with a pattern of three movements followed by a period of relief before the symptom recurs. The patient denies any abdominal pain associated with the diarrhea. The patient has a history of IBS, which has been quiescent for several years, and this current episode of diarrhea is the first significant flare-up in a while. The patient denies any recent antibiotic use and no one else in the family has similar symptoms. The patient's sister also has a history of IBS.  Diabetes:  Complications: CKD and glomerulopathy.  Glucose checking: not checking Most recent A1C: 5.8 Current medications: none.  Last Eye Exam: June 2024 Foot checks:daily   Hyperlipidemia: Current medications: On rosuvastatin 40 mg daily and on vascepa 1 gm 2 capsules twice daily.    Hypertensive CKD: Complications: Last GFR 31 Current medications: lisinopril 20 mg daily, metoprolol succinate 200 mg once daily, chlorthalidone 25 mg daily, amlodipine 10 mg daily, kerendia 10 mg daily, aspirin 81 mg daily.  Patient has seen nephrology.   Hypothyroidism: on synthroid 112 mcg once daily in am.    Atrial fibrillation related to acute illness:  on toprol xl 100 mg twice daily. No blood thinners recommended.    Diet: healthy Exercise: active   Back Pain: on tramadol 50 mg twice daily. Decreased by nephrology. Recommended to take tylenol more if needed. Doing well.   Insomnia: Ambien cr 12.5 mg one before bed.      07/18/2023    8:47 AM 03/21/2023    8:37 AM 03/21/2022    3:34 PM 10/17/2021    7:41 AM 07/13/2021    7:41 AM  Depression screen PHQ 2/9  Decreased Interest 0 0 0 0 0  Down, Depressed, Hopeless 0 0 0 0 0  PHQ - 2 Score 0 0 0 0 0        07/18/2023    8:47 AM  Fall Risk   Falls in the past year? 0  Number falls in past yr: 0  Risk for fall due to : No Fall Risks  Follow up Falls evaluation completed;Falls prevention discussed    Patient Care Team: Blane Ohara, MD as PCP - General (Family Medicine) Tyler Pita, MD as Attending Physician (Internal Medicine) Estill Bamberg, MD as Consulting Physician (Orthopedic Surgery) Claria Dice, MD as Attending Physician (Physical Medicine and Rehabilitation) Park Liter, DPM (Inactive) as Consulting Physician (Podiatry)   Review of Systems  Constitutional:  Negative for chills, fatigue and fever.  HENT:  Negative for congestion, ear pain, rhinorrhea and sore throat.   Respiratory:  Negative for cough and shortness of breath.   Cardiovascular:  Negative for chest pain.  Gastrointestinal:  Negative for abdominal pain, constipation, diarrhea, nausea and vomiting.  Endocrine: Negative for heat intolerance.  Genitourinary:  Negative for dysuria and urgency.  Musculoskeletal:  Negative for back pain and myalgias.  Neurological:  Negative for dizziness, weakness, light-headedness and headaches.  Psychiatric/Behavioral:  Negative for dysphoric mood. The patient is not nervous/anxious.     Current Outpatient Medications on File Prior to Visit  Medication Sig Dispense Refill   allopurinol (ZYLOPRIM) 100 MG tablet TAKE ONE TABLET BY MOUTH EVERY EVENING 90 tablet 0    amLODipine (NORVASC) 10 MG tablet TAKE ONE TABLET BY MOUTH EVERY EVENING 90 tablet 1   aspirin 81 MG tablet Take 81 mg by mouth daily.     chlorthalidone (HYGROTON) 25 MG tablet TAKE ONE TABLET BY MOUTH EVERY DAY 90 tablet 1   KERENDIA 10 MG TABS TAKE ONE TABLET BY MOUTH EVERY DAY 30 tablet 3   levothyroxine (SYNTHROID) 112 MCG tablet TAKE ONE TABLET BY MOUTH EVERY MORNING 90 tablet 1   lisinopril (ZESTRIL) 20 MG tablet TAKE ONE TABLET BY MOUTH EVERY DAY 90 tablet 1   meclizine (ANTIVERT) 25 MG tablet Take 1 tablet (25 mg total) by mouth 3 (three) times daily as needed for dizziness. 30 tablet 2   metoprolol succinate (TOPROL-XL) 100 MG 24 hr tablet TAKE 2 TABLETS BY MOUTH EVERY DAY 180 tablet 1   ondansetron (ZOFRAN-ODT) 8 MG disintegrating tablet Take 1 tablet (8 mg total) by mouth every 8 (eight) hours as needed for nausea or vomiting. 30 tablet 0   pantoprazole (PROTONIX) 40 MG tablet TAKE ONE TABLET BY MOUTH EVERY EVENING 90 tablet 3   potassium chloride (KLOR-CON) 10 MEQ tablet TAKE ONE TABLET BY MOUTH EVERY DAY 90 tablet 1   promethazine (PHENERGAN) 25 MG tablet TAKE ONE TABLET BY MOUTH 4 TIMES DAILY AS NEEDED FOR NAUSEA AND VOMITING 60 tablet 3   rosuvastatin (CRESTOR) 40 MG tablet TAKE ONE TABLET BY MOUTH EVERY EVENING 90 tablet 1   traMADol (ULTRAM) 50 MG tablet Take 1 tablet (50 mg total) by mouth every 8 (eight) hours as needed for severe pain (back pain). 90 tablet 3   VASCEPA 1 g capsule TAKE 2 CAPSULES BY MOUTH TWICE DAILY 360 capsule 1   zolpidem (AMBIEN CR) 12.5 MG CR tablet TAKE ONE TABLET BY MOUTH AT BEDTIME 30 tablet 5   No current facility-administered medications on file prior to visit.   Past Medical History:  Diagnosis Date   Abdominal aortic atherosclerosis (HCC) 03/02/2020   Age-related osteoporosis without current pathological fracture    Arthritis    Barrett's esophagus without dysplasia    Benign positional vertigo, bilateral 03/21/2023   Bilateral carpal tunnel  syndrome 07/03/2018   Bilateral hand pain 07/03/2018   Cancer (HCC)    denies   Cervical spinal stenosis 03/06/2016   Chronic idiopathic gout involving toe of right foot without tophus 09/29/2020   Chronic kidney disease    Chronic pain syndrome 03/11/2022   Diabetes mellitus with stage 3 chronic kidney disease (HCC) 07/17/2021   Diabetic glomerulopathy (HCC) 12/16/2022   Exostosis 07/16/2017   Flu vaccine need 07/17/2021   Foot pain, bilateral 03/02/2020   GERD (gastroesophageal reflux disease)    Hearing loss    History of COVID-19    Hypercholesterolemia    Hypertension    Hypertensive chronic kidney disease with stage 1 through stage 4 chronic kidney disease, or unspecified chronic kidney disease    Hypertensive kidney disease with stage 4 chronic kidney disease (HCC) 12/16/2022   IBS (irritable bowel syndrome)    Low back pain  Lumbar pain 03/02/2020   Mixed hyperlipidemia    Paresthesia of skin    Paroxysmal atrial fibrillation (HCC) 04/28/2022   Post-surgical hypothyroidism 12/16/2022   Primary insomnia    Scars    Secondary DM with CKD stage 4 and hypertension (HCC) 12/16/2022   Telogen effluvium 03/02/2020   Vitamin D deficiency, unspecified    Past Surgical History:  Procedure Laterality Date   ANTERIOR CERVICAL DECOMP/DISCECTOMY FUSION N/A 03/06/2016   Procedure: ACDF - C5-C6 - C6-C7  ;  Surgeon: Julio Sicks, MD;  Location: [facility];  Service: Neurosurgery;  Laterality: N/A;  ACDF - C5-C6 - C6-C7     APPENDECTOMY     BUNIONECTOMY     right and left   CARDIAC CATHETERIZATION     had in [place] around 2012   CATARACT EXTRACTION W/ INTRAOCULAR LENS  IMPLANT, BILATERAL     CESAREAN SECTION     COLONOSCOPY W/ BIOPSIES AND POLYPECTOMY     MULTIPLE TOOTH EXTRACTIONS     TUBAL LIGATION      Family History  Problem Relation Age of Onset   Heart attack Mother    Heart attack Father    Hypertension Sister    Hypertension Brother    Renal Disease Brother     Heart attack Brother    Multiple myeloma Brother    Hypertension Sister    Hypertension Sister    Heart attack Sister    Social History   Socioeconomic History   Marital status: Married    Spouse name: Not on file   Number of children: 3   Years of education: Not on file   Highest education level: Not on file  Occupational History   Occupation: Retired Charity fundraiser  Tobacco Use   Smoking status: Former    Current packs/day: 0.00    Types: Cigarettes    Quit date: 10/02/2015    Years since quitting: 8.0   Smokeless tobacco: Never  Vaping Use   Vaping status: Never Used  Substance and Sexual Activity   Alcohol use: No   Drug use: No   Sexual activity: Not on file  Other Topics Concern   Not on file  Social History Narrative   Not on file   Social Drivers of Health   Financial Resource Strain: Low Risk  (03/21/2023)   Overall Financial Resource Strain (CARDIA)    Difficulty of Paying Living Expenses: Not hard at all  Food Insecurity: No Food Insecurity (10/23/2023)   Hunger Vital Sign    Worried About Running Out of Food in the Last Year: Never true    Ran Out of Food in the Last Year: Never true  Transportation Needs: No Transportation Needs (10/23/2023)   PRAPARE - Administrator, Civil Service (Medical): No    Lack of Transportation (Non-Medical): No  Physical Activity: Insufficiently Active (10/23/2023)   Exercise Vital Sign    Days of Exercise per Week: 3 days    Minutes of Exercise per Session: 30 min  Stress: No Stress Concern Present (10/23/2023)   Pamela Schwartz    Feeling of Stress : Not at all  Social Connections: Moderately Isolated (03/21/2023)   Social Connection and Isolation Panel [NHANES]    Frequency of Communication with Friends and Family: More than three times a week    Frequency of Social Gatherings with Friends and Family: Three times a week    Attends Religious Services: Never  Active Member of Clubs or Organizations: No    Attends Banker Meetings: Never    Marital Status: Married    Objective:  BP 110/64   Pulse 80   Temp 97.6 F (36.4 C)   Ht 5\' 5"  (1.651 m)   Wt 138 lb (62.6 kg)   SpO2 92%   BMI 22.96 kg/m      10/23/2023   10:49 AM 07/18/2023    8:43 AM 03/21/2023    8:33 AM  BP/Weight  Systolic BP 110 110 118  Diastolic BP 64 52 80  Wt. (Lbs) 138 138.2 143  BMI 22.96 kg/m2 23 kg/m2 23.8 kg/m2    Physical Exam Vitals reviewed.  Constitutional:      Appearance: Normal appearance. She is normal weight.  Neck:     Vascular: Carotid bruit (left bruit) present.  Cardiovascular:     Rate and Rhythm: Normal rate and regular rhythm.     Heart sounds: Normal heart sounds.  Pulmonary:     Effort: Pulmonary effort is normal. No respiratory distress.     Breath sounds: Normal breath sounds.  Abdominal:     General: Abdomen is flat. Bowel sounds are normal.     Palpations: Abdomen is soft.     Tenderness: There is no abdominal tenderness.  Neurological:     Mental Status: She is alert and oriented to person, place, and time.  Psychiatric:        Mood and Affect: Mood normal.        Behavior: Behavior normal.     Diabetic Foot Exam - Simple   No data filed      Lab Results  Component Value Date   WBC 8.6 10/23/2023   HGB 13.5 10/23/2023   HCT 41.7 10/23/2023   PLT 212 10/23/2023   GLUCOSE 84 10/23/2023   CHOL 187 10/23/2023   TRIG 119 10/23/2023   HDL 59 10/23/2023   LDLCALC 107 (H) 10/23/2023   ALT 17 10/23/2023   AST 19 10/23/2023   NA 142 10/23/2023   K 5.5 (H) 10/23/2023   CL 102 10/23/2023   CREATININE 1.77 (H) 10/23/2023   BUN 25 10/23/2023   CO2 25 10/23/2023   TSH 0.949 03/19/2023   HGBA1C 5.9 (H) 10/23/2023   MICROALBUR 150 10/17/2021      Assessment & Plan:    Diabetic glomerulopathy (HCC) Assessment & Plan: Control: good Continue lisinopril/kerendia.  Recommend check feet daily. Recommend  annual eye exams. Continue to work on eating a healthy diet and exercise.  Labs drawn today    Orders: -     CBC with Differential/Platelet -     Hemoglobin A1c  Hypertensive kidney disease with stage 4 chronic kidney disease (HCC) Assessment & Plan: Stable. well controlled.  Continue lisinopril 20 mg daily, metoprolol succinate 200 mg once daily, chlorthalidone 25 mg daily, amlodipine 10 mg daily, aspirin 81 mg daily.  Labs drawn today  Orders: -     Comprehensive metabolic panel  Mixed hyperlipidemia Assessment & Plan: Not at goal.  Recommend start on zetia 10 mg before bed, Continue rosuvastatin 40 mg daily and on vascepa 1 gm 2 capsules twice daily.  Continue to work on eating a healthy diet and exercise.  Labs drawn today  Orders: -     Lipid panel  Post-surgical hypothyroidism Assessment & Plan: Well controlled Continue Synthroid at current dose      Encounter for immunization [employer] Vaccine  30yrs & older  Diarrhea, unspecified type Assessment & Plan: Order stool studies. Recommend immodium.  Orders: -     Cdiff NAA+O+P+Stool Culture  Left carotid bruit Assessment & Plan: Order vascular ultrasound  Orders: -     VAS US CAROTID; Future     No orders of the defined types were placed in this encounter.   Orders Placed This Encounter  Procedures   Cdiff NAA+O+P+Stool Culture   [employer] Comirnaty Covid-19 Vaccine 87yrs & older   CBC with Differential/Platelet   Comprehensive metabolic panel   Lipid panel   Hemoglobin A1c   VAS US CAROTID     Follow-up: Return in about 3 months (around 01/21/2024) for chronic follow up.   I,Marla I Leal-Borjas,acting as a scribe for Blane Ohara, MD.,have documented all relevant documentation on the behalf of Blane Ohara, MD,as directed by  Blane Ohara, MD while in the presence of Blane Ohara, MD.   An After Visit Summary was printed and given to the patient.  I attest that I have  reviewed this visit and agree with the plan scribed by my staff.   Blane Ohara, MD [facility] [phone]

## 2023-10-23 ENCOUNTER — Encounter: Payer: Self-pay | Admitting: Family Medicine

## 2023-10-23 ENCOUNTER — Ambulatory Visit (INDEPENDENT_AMBULATORY_CARE_PROVIDER_SITE_OTHER): Payer: Medicare Other | Admitting: Family Medicine

## 2023-10-23 VITALS — BP 110/64 | HR 80 | Temp 97.6°F | Ht 65.0 in | Wt 138.0 lb

## 2023-10-23 DIAGNOSIS — I129 Hypertensive chronic kidney disease with stage 1 through stage 4 chronic kidney disease, or unspecified chronic kidney disease: Secondary | ICD-10-CM

## 2023-10-23 DIAGNOSIS — Z23 Encounter for immunization: Secondary | ICD-10-CM

## 2023-10-23 DIAGNOSIS — E890 Postprocedural hypothyroidism: Secondary | ICD-10-CM | POA: Diagnosis not present

## 2023-10-23 DIAGNOSIS — I4891 Unspecified atrial fibrillation: Secondary | ICD-10-CM | POA: Diagnosis not present

## 2023-10-23 DIAGNOSIS — E1122 Type 2 diabetes mellitus with diabetic chronic kidney disease: Secondary | ICD-10-CM

## 2023-10-23 DIAGNOSIS — R197 Diarrhea, unspecified: Secondary | ICD-10-CM | POA: Diagnosis not present

## 2023-10-23 DIAGNOSIS — E782 Mixed hyperlipidemia: Secondary | ICD-10-CM

## 2023-10-23 DIAGNOSIS — E1121 Type 2 diabetes mellitus with diabetic nephropathy: Secondary | ICD-10-CM

## 2023-10-23 DIAGNOSIS — R0989 Other specified symptoms and signs involving the circulatory and respiratory systems: Secondary | ICD-10-CM | POA: Diagnosis not present

## 2023-10-23 DIAGNOSIS — N184 Chronic kidney disease, stage 4 (severe): Secondary | ICD-10-CM

## 2023-10-24 ENCOUNTER — Encounter: Payer: Self-pay | Admitting: Family Medicine

## 2023-10-24 ENCOUNTER — Other Ambulatory Visit: Payer: Self-pay | Admitting: Family Medicine

## 2023-10-24 DIAGNOSIS — R197 Diarrhea, unspecified: Secondary | ICD-10-CM | POA: Diagnosis not present

## 2023-10-24 LAB — COMPREHENSIVE METABOLIC PANEL
ALT: 17 [IU]/L (ref 0–32)
AST: 19 [IU]/L (ref 0–40)
Albumin: 4.1 g/dL (ref 3.8–4.8)
Alkaline Phosphatase: 116 [IU]/L (ref 44–121)
BUN/Creatinine Ratio: 14 (ref 12–28)
BUN: 25 mg/dL (ref 8–27)
Bilirubin Total: <0.2 mg/dL (ref 0.0–1.2)
CO2: 25 mmol/L (ref 20–29)
Calcium: 9.4 mg/dL (ref 8.7–10.3)
Chloride: 102 mmol/L (ref 96–106)
Creatinine, Ser: 1.77 mg/dL — ABNORMAL HIGH (ref 0.57–1.00)
Globulin, Total: 2.4 g/dL (ref 1.5–4.5)
Glucose: 84 mg/dL (ref 70–99)
Potassium: 5.5 mmol/L — ABNORMAL HIGH (ref 3.5–5.2)
Sodium: 142 mmol/L (ref 134–144)
Total Protein: 6.5 g/dL (ref 6.0–8.5)
eGFR: 29 mL/min/{1.73_m2} — ABNORMAL LOW (ref >59)

## 2023-10-24 LAB — LIPID PANEL
Chol/HDL Ratio: 3.2 {ratio} (ref 0.0–4.4)
Cholesterol, Total: 187 mg/dL (ref 100–199)
HDL: 59 mg/dL (ref >39)
LDL Chol Calc (NIH): 107 mg/dL — ABNORMAL HIGH (ref 0–99)
Triglycerides: 119 mg/dL (ref 0–149)
VLDL Cholesterol Cal: 21 mg/dL (ref 5–40)

## 2023-10-24 LAB — CBC WITH DIFFERENTIAL/PLATELET
Basophils Absolute: 0.1 10*3/uL (ref 0.0–0.2)
Basos: 1 %
EOS (ABSOLUTE): 0.4 10*3/uL (ref 0.0–0.4)
Eos: 5 %
Hematocrit: 41.7 % (ref 34.0–46.6)
Hemoglobin: 13.5 g/dL (ref 11.1–15.9)
Immature Grans (Abs): 0 10*3/uL (ref 0.0–0.1)
Immature Granulocytes: 0 %
Lymphocytes Absolute: 2.5 10*3/uL (ref 0.7–3.1)
Lymphs: 29 %
MCH: 29.9 pg (ref 26.6–33.0)
MCHC: 32.4 g/dL (ref 31.5–35.7)
MCV: 93 fL (ref 79–97)
Monocytes Absolute: 0.8 10*3/uL (ref 0.1–0.9)
Monocytes: 9 %
Neutrophils Absolute: 4.8 10*3/uL (ref 1.4–7.0)
Neutrophils: 56 %
Platelets: 212 10*3/uL (ref 150–450)
RBC: 4.51 x10E6/uL (ref 3.77–5.28)
RDW: 12.4 % (ref 11.7–15.4)
WBC: 8.6 10*3/uL (ref 3.4–10.8)

## 2023-10-24 LAB — HEMOGLOBIN A1C
Est. average glucose Bld gHb Est-mCnc: 123 mg/dL
Hgb A1c MFr Bld: 5.9 % — ABNORMAL HIGH (ref 4.8–5.6)

## 2023-10-24 MED ORDER — EZETIMIBE 10 MG PO TABS: 10.0000 mg | ORAL_TABLET | Freq: Every day | ORAL | 0 refills | Status: DC

## 2023-10-26 DIAGNOSIS — R197 Diarrhea, unspecified: Secondary | ICD-10-CM | POA: Insufficient documentation

## 2023-10-26 DIAGNOSIS — R0989 Other specified symptoms and signs involving the circulatory and respiratory systems: Secondary | ICD-10-CM | POA: Insufficient documentation

## 2023-10-26 NOTE — Assessment & Plan Note (Signed)
Well controlled.  No changes to medicines. rosuvastatin 40 mg daily and on vascepa 1 gm 2 capsules twice daily.  Continue to work on eating a healthy diet and exercise.  Labs drawn today.

## 2023-10-26 NOTE — Assessment & Plan Note (Signed)
Well controlled Continue Synthroid at current dose

## 2023-10-26 NOTE — Assessment & Plan Note (Signed)
Stable. well controlled.  Continue lisinopril 20 mg daily, metoprolol succinate 200 mg once daily, chlorthalidone 25 mg daily, amlodipine 10 mg daily, aspirin 81 mg daily.  Labs drawn today

## 2023-10-26 NOTE — Assessment & Plan Note (Signed)
Order vascular ultrasound

## 2023-10-26 NOTE — Assessment & Plan Note (Signed)
Control: good Continue lisinopril/kerendia.  Recommend check feet daily. Recommend annual eye exams. Continue to work on eating a healthy diet and exercise.  Labs drawn today

## 2023-10-26 NOTE — Assessment & Plan Note (Signed)
Order stool studies.

## 2023-10-27 DIAGNOSIS — Z23 Encounter for immunization: Secondary | ICD-10-CM | POA: Insufficient documentation

## 2023-10-29 ENCOUNTER — Telehealth: Payer: Self-pay

## 2023-10-29 ENCOUNTER — Other Ambulatory Visit: Payer: Self-pay

## 2023-10-29 ENCOUNTER — Ambulatory Visit: Payer: Medicare Other

## 2023-10-29 ENCOUNTER — Other Ambulatory Visit: Payer: Self-pay | Admitting: Family Medicine

## 2023-10-29 NOTE — Telephone Encounter (Signed)
Called patient, left voicemail for patient to call office to reschedule.

## 2023-10-30 LAB — CDIFF NAA+O+P+STOOL CULTURE
E coli, Shiga toxin Assay: NEGATIVE
Toxigenic C. Difficile by PCR: NEGATIVE

## 2023-10-31 DIAGNOSIS — R0989 Other specified symptoms and signs involving the circulatory and respiratory systems: Secondary | ICD-10-CM | POA: Diagnosis not present

## 2023-11-05 ENCOUNTER — Encounter: Payer: Self-pay | Admitting: Family Medicine

## 2023-11-11 ENCOUNTER — Other Ambulatory Visit: Payer: Self-pay | Admitting: Family Medicine

## 2023-11-11 DIAGNOSIS — M1A0710 Idiopathic chronic gout, right ankle and foot, without tophus (tophi): Secondary | ICD-10-CM

## 2023-11-26 ENCOUNTER — Other Ambulatory Visit: Payer: Self-pay | Admitting: Family Medicine

## 2023-12-02 ENCOUNTER — Other Ambulatory Visit: Payer: Self-pay

## 2023-12-02 MED ORDER — TRAMADOL HCL 50 MG PO TABS: 50.0000 mg | ORAL_TABLET | Freq: Three times a day (TID) | ORAL | 3 refills | Status: DC | PRN

## 2023-12-09 NOTE — Telephone Encounter (Unsigned)
Copied from CRM [id number]. Topic: Clinical - Medical Advice >> Dec 09, 2023 12:19 PM Gildardo Pounds wrote: Reason for CRM: Patient wants to speak with Dr Cox's nurse. Advised we can have the nurse return call but patient insisted. No answer at [facility]. Callback number [phone]

## 2023-12-10 ENCOUNTER — Encounter: Payer: Self-pay | Admitting: Family Medicine

## 2023-12-10 ENCOUNTER — Ambulatory Visit: Payer: Medicare Other | Admitting: Family Medicine

## 2023-12-10 VITALS — BP 148/78 | HR 73 | Temp 97.3°F | Ht 65.0 in | Wt 131.0 lb

## 2023-12-10 DIAGNOSIS — R110 Nausea: Secondary | ICD-10-CM | POA: Diagnosis not present

## 2023-12-10 DIAGNOSIS — E860 Dehydration: Secondary | ICD-10-CM | POA: Diagnosis not present

## 2023-12-10 DIAGNOSIS — R1084 Generalized abdominal pain: Secondary | ICD-10-CM | POA: Diagnosis not present

## 2023-12-10 DIAGNOSIS — R634 Abnormal weight loss: Secondary | ICD-10-CM | POA: Diagnosis not present

## 2023-12-10 LAB — POCT URINALYSIS DIP (CLINITEK)
Bilirubin, UA: NEGATIVE
Blood, UA: NEGATIVE
Glucose, UA: NEGATIVE mg/dL
Leukocytes, UA: NEGATIVE
Nitrite, UA: NEGATIVE
Spec Grav, UA: 1.01 (ref 1.010–1.025)
Urobilinogen, UA: 0.2 U/dL
pH, UA: 8.5 — AB (ref 5.0–8.0)

## 2023-12-10 LAB — LAB REPORT - SCANNED
Calcium: 8.7
EGFR: 46
Free T4: 2.17 ng/dL
TSH: 0.97 (ref 0.41–5.90)

## 2023-12-10 MED ORDER — ONDANSETRON 4 MG PO TBDP: 4.0000 mg | ORAL_TABLET | Freq: Three times a day (TID) | ORAL | 0 refills | Status: AC | PRN

## 2023-12-10 MED ORDER — MEGESTROL ACETATE 40 MG/ML PO SUSP: 200.0000 mg | Freq: Every day | ORAL | 0 refills | Status: DC

## 2023-12-10 NOTE — Assessment & Plan Note (Signed)
Significant weight loss over the past month associated with anorexia and nausea. No specific food aversions. No dysphagia. No recent illnesses or changes in bowel habits. No pain. Mild dehydration noted on physical exam. -Order comprehensive metabolic panel (CMP) and complete blood count (CBC) to assess for metabolic or hematologic causes. -Order chest x-ray and CT scan of abdomen and pelvis to evaluate for any structural abnormalities or malignancies. -Administer 2 units of normal saline IV for dehydration. -Prescribe Zofran ODT for nausea.

## 2023-12-10 NOTE — Assessment & Plan Note (Signed)
Acute - 2 weeks -Order chest x-ray STAT (lungs clear, cardiac or mediastinal contours are normal) -CT scan of abdomen and pelvis to evaluate for any structural abnormalities or malignancies. - not resulted -Referral to gastroenterology

## 2023-12-10 NOTE — Assessment & Plan Note (Addendum)
Labs drawn STAT at specialty procedures, below have resulted  GFR - 46, normal Calcium - 8.7 normal TSH - 0.97 normal T4 - 2.17 normal -Zofran sent to pharmacy  -2L of NS administered by specialty procedures

## 2023-12-10 NOTE — Progress Notes (Signed)
Acute Office Visit  Subjective:    Patient ID: Addalyne Vandehei, female    DOB: February 03, 1941, 83 y.o.   MRN: [id number]  Chief Complaint  Patient presents with   Loss of appetite    Discussed the use of AI scribe software for clinical note transcription with the patient, who gave verbal consent to proceed.   HPI: The patient is a 83 year old female who presents with significant weight loss and lack of appetite. She is accompanied by her husband.  Over the past two to three weeks, she has experienced a marked decrease in appetite, with a complete lack of desire to eat. She describes a sensation that food will regurgitate if she attempts to eat. Her husband notes that she has not been able to eat properly for at least two weeks, closer to three weeks, resulting in a weight loss of seven pounds.  She experiences underlying nausea and describes a constant awareness of her stomach, stating 'I'm aware of where my stomach is inside my body.' No recent fever, chills, night sweats, cough, sinus drainage, or headaches. She denies any difficulty with urination but reports loose stools, which she states is her normal pattern.  She has not been taking her medications because the last time she attempted, she vomited. She has been unable to swallow her medications since then due to fear of vomiting again. She has not started any new medications recently.  She feels very weak and tired, often needing to lie down shortly after getting up. Her husband notes she stays in bed most of the time due to lack of energy.  She has a history of anemia requiring treatment with shots a few years back. She has not had her thyroid checked recently and has not been taking her thyroid medication.  Past Medical History:  Diagnosis Date   Abdominal aortic atherosclerosis (HCC) 03/02/2020   Age-related osteoporosis without current pathological fracture    Arthritis    Barrett's esophagus without dysplasia    Benign  positional vertigo, bilateral 03/21/2023   Bilateral carpal tunnel syndrome 07/03/2018   Bilateral hand pain 07/03/2018   Cancer (HCC)    denies   Cervical spinal stenosis 03/06/2016   Chronic idiopathic gout involving toe of right foot without tophus 09/29/2020   Chronic kidney disease    Chronic pain syndrome 03/11/2022   Diabetes mellitus with stage 3 chronic kidney disease (HCC) 07/17/2021   Diabetic glomerulopathy (HCC) 12/16/2022   Exostosis 07/16/2017   Flu vaccine need 07/17/2021   Foot pain, bilateral 03/02/2020   GERD (gastroesophageal reflux disease)    Hearing loss    History of COVID-19    Hypercholesterolemia    Hypertension    Hypertensive chronic kidney disease with stage 1 through stage 4 chronic kidney disease, or unspecified chronic kidney disease    Hypertensive kidney disease with stage 4 chronic kidney disease (HCC) 12/16/2022   IBS (irritable bowel syndrome)    Low back pain    Lumbar pain 03/02/2020   Mixed hyperlipidemia    Paresthesia of skin    Paroxysmal atrial fibrillation (HCC) 04/28/2022   Post-surgical hypothyroidism 12/16/2022   Primary insomnia    Scars    Secondary DM with CKD stage 4 and hypertension (HCC) 12/16/2022   Telogen effluvium 03/02/2020   Vitamin D deficiency, unspecified     Past Surgical History:  Procedure Laterality Date   ANTERIOR CERVICAL DECOMP/DISCECTOMY FUSION N/A 03/06/2016   Procedure: ACDF - C5-C6 - C6-C7  ;  Surgeon:  Julio Sicks, MD;  Location: [facility];  Service: Neurosurgery;  Laterality: N/A;  ACDF - C5-C6 - C6-C7     APPENDECTOMY     BUNIONECTOMY     right and left   CARDIAC CATHETERIZATION     had in [place] around 2012   CATARACT EXTRACTION W/ INTRAOCULAR LENS  IMPLANT, BILATERAL     CESAREAN SECTION     COLONOSCOPY W/ BIOPSIES AND POLYPECTOMY     MULTIPLE TOOTH EXTRACTIONS     TUBAL LIGATION      Family History  Problem Relation Age of Onset   Heart attack Mother    Heart attack Father     Hypertension Sister    Hypertension Brother    Renal Disease Brother    Heart attack Brother    Multiple myeloma Brother    Hypertension Sister    Hypertension Sister    Heart attack Sister     Social History   Socioeconomic History   Marital status: Married    Spouse name: Not on file   Number of children: 3   Years of education: Not on file   Highest education level: Not on file  Occupational History   Occupation: Retired Charity fundraiser  Tobacco Use   Smoking status: Former    Current packs/day: 0.00    Types: Cigarettes    Quit date: 10/02/2015    Years since quitting: 8.1   Smokeless tobacco: Never  Vaping Use   Vaping status: Never Used  Substance and Sexual Activity   Alcohol use: No   Drug use: No   Sexual activity: Not on file  Other Topics Concern   Not on file  Social History Narrative   Not on file   Social Drivers of Health   Financial Resource Strain: Low Risk  (03/21/2023)   Overall Financial Resource Strain (CARDIA)    Difficulty of Paying Living Expenses: Not hard at all  Food Insecurity: No Food Insecurity (10/23/2023)   Hunger Vital Sign    Worried About Running Out of Food in the Last Year: Never true    Ran Out of Food in the Last Year: Never true  Transportation Needs: No Transportation Needs (10/23/2023)   PRAPARE - Administrator, Civil Service (Medical): No    Lack of Transportation (Non-Medical): No  Physical Activity: Insufficiently Active (10/23/2023)   Exercise Vital Sign    Days of Exercise per Week: 3 days    Minutes of Exercise per Session: 30 min  Stress: No Stress Concern Present (10/23/2023)   [employer] of Occupational Health - Occupational Stress Questionnaire    Feeling of Stress : Not at all  Social Connections: Moderately Isolated (03/21/2023)   Social Connection and Isolation Panel [NHANES]    Frequency of Communication with Friends and Family: More than three times a week    Frequency of Social Gatherings with  Friends and Family: Three times a week    Attends Religious Services: Never    Active Member of Clubs or Organizations: No    Attends Banker Meetings: Never    Marital Status: Married  Catering manager Violence: Not At Risk (10/23/2023)   Humiliation, Afraid, Rape, and Kick questionnaire    Fear of Current or Ex-Partner: No    Emotionally Abused: No    Physically Abused: No    Sexually Abused: No    Outpatient Medications Prior to Visit  Medication Sig Dispense Refill   allopurinol (ZYLOPRIM) 100 MG tablet TAKE ONE  TABLET BY MOUTH EVERY EVENING 90 tablet 1   amLODipine (NORVASC) 10 MG tablet TAKE ONE TABLET BY MOUTH EVERY EVENING 90 tablet 1   aspirin 81 MG tablet Take 81 mg by mouth daily.     chlorthalidone (HYGROTON) 25 MG tablet TAKE ONE TABLET BY MOUTH EVERY DAY 90 tablet 1   ezetimibe (ZETIA) 10 MG tablet Take 1 tablet (10 mg total) by mouth daily. 90 tablet 0   KERENDIA 10 MG TABS TAKE ONE TABLET BY MOUTH EVERY DAY 30 tablet 3   levothyroxine (SYNTHROID) 112 MCG tablet TAKE ONE TABLET BY MOUTH EVERY MORNING 90 tablet 1   lisinopril (ZESTRIL) 20 MG tablet TAKE ONE TABLET BY MOUTH EVERY DAY 90 tablet 1   meclizine (ANTIVERT) 25 MG tablet Take 1 tablet (25 mg total) by mouth 3 (three) times daily as needed for dizziness. 30 tablet 2   metoprolol succinate (TOPROL-XL) 100 MG 24 hr tablet TAKE 2 TABLETS BY MOUTH EVERY DAY 180 tablet 1   pantoprazole (PROTONIX) 40 MG tablet TAKE ONE TABLET BY MOUTH EVERY EVENING 90 tablet 3   potassium chloride (KLOR-CON) 10 MEQ tablet TAKE ONE TABLET BY MOUTH EVERY DAY 90 tablet 1   promethazine (PHENERGAN) 25 MG tablet TAKE ONE TABLET BY MOUTH 4 TIMES DAILY AS NEEDED FOR NAUSEA AND VOMITING 60 tablet 3   rosuvastatin (CRESTOR) 40 MG tablet TAKE ONE TABLET BY MOUTH EVERY EVENING 90 tablet 1   traMADol (ULTRAM) 50 MG tablet Take 1 tablet (50 mg total) by mouth every 8 (eight) hours as needed for severe pain (pain score 7-10) (back pain).  90 tablet 3   VASCEPA 1 g capsule TAKE 2 CAPSULES BY MOUTH TWICE DAILY 360 capsule 1   zolpidem (AMBIEN CR) 12.5 MG CR tablet TAKE ONE TABLET BY MOUTH AT BEDTIME 30 tablet 5   ondansetron (ZOFRAN-ODT) 8 MG disintegrating tablet Take 1 tablet (8 mg total) by mouth every 8 (eight) hours as needed for nausea or vomiting. 30 tablet 0   No facility-administered medications prior to visit.    Allergies  Allergen Reactions   Clonidine Derivatives Other (See Comments)   Dexilant [Dexlansoprazole] Diarrhea   Hydralazine    Nickel    Tape Other (See Comments)    adhesive    Tizanidine Hcl Other (See Comments)    Somnolence   Zantac [Ranitidine Hcl]    Chantix [Varenicline] Rash    Review of Systems  Constitutional:  Positive for appetite change, fatigue and unexpected weight change. Negative for chills, diaphoresis and fever.  HENT:  Negative for congestion, ear pain, sinus pressure and sore throat.   Respiratory:  Negative for cough, chest tightness, shortness of breath and wheezing.   Cardiovascular:  Negative for chest pain and palpitations.  Gastrointestinal:  Positive for diarrhea (normal). Negative for abdominal pain, constipation, nausea and vomiting.  Genitourinary:  Positive for decreased urine volume. Negative for dysuria and hematuria.  Musculoskeletal:  Negative for arthralgias, back pain, joint swelling and myalgias.  Skin:  Negative for rash.  Neurological:  Positive for weakness. Negative for dizziness and headaches.  Psychiatric/Behavioral:  Negative for dysphoric mood. The patient is not nervous/anxious.        Objective:        12/10/2023    8:20 AM 10/23/2023   10:49 AM 07/18/2023    8:43 AM  Vitals with BMI  Height 5\' 5"  5\' 5"  5\' 5"   Weight 131 lbs 138 lbs 138 lbs 3 oz  BMI 21.8 22.96  23  Systolic 148 110 191  Diastolic 78 64 52  Pulse 73 80 72    Orthostatic VS for the past 72 hrs (Last 3 readings):  Patient Position BP Location  12/10/23 0820 Sitting  Left Arm     Physical Exam Constitutional:      General: She is not in acute distress.    Appearance: Normal appearance. She is ill-appearing.  HENT:     Right Ear: There is impacted cerumen.     Left Ear: Tympanic membrane is erythematous.  Eyes:     Conjunctiva/sclera: Conjunctivae normal.  Cardiovascular:     Rate and Rhythm: Normal rate and regular rhythm.     Heart sounds: Normal heart sounds. No murmur heard. Pulmonary:     Effort: Pulmonary effort is normal.     Breath sounds: Normal breath sounds. No wheezing.  Abdominal:     General: Bowel sounds are normal.     Palpations: Abdomen is soft.     Tenderness: There is abdominal tenderness.  Musculoskeletal:     Cervical back: Normal range of motion.  Skin:    General: Skin is warm.     Coloration: Skin is pale.  Neurological:     Mental Status: She is alert. Mental status is at baseline.     Motor: Weakness present.     Comments: Orthostatic - positive  Psychiatric:        Mood and Affect: Mood normal.        Behavior: Behavior normal.     Health Maintenance Due  Topic Date Due   Medicare Annual Wellness (AWV)  03/22/2023   Diabetic kidney evaluation - Urine ACR  12/15/2023    There are no preventive care reminders to display for this patient.   Lab Results  Component Value Date   TSH 0.97 12/10/2023   Lab Results  Component Value Date   WBC 8.6 10/23/2023   HGB 13.5 10/23/2023   HCT 41.7 10/23/2023   MCV 93 10/23/2023   PLT 212 10/23/2023   Lab Results  Component Value Date   NA 142 10/23/2023   K 5.5 (H) 10/23/2023   CO2 25 10/23/2023   GLUCOSE 84 10/23/2023   BUN 25 10/23/2023   CREATININE 1.77 (H) 10/23/2023   BILITOT <0.2 10/23/2023   ALKPHOS 116 10/23/2023   AST 19 10/23/2023   ALT 17 10/23/2023   PROT 6.5 10/23/2023   ALBUMIN 4.1 10/23/2023   CALCIUM 8.7 12/10/2023   ANIONGAP 12 03/06/2016   EGFR 46.0 12/10/2023   Lab Results  Component Value Date   CHOL 187 10/23/2023   Lab  Results  Component Value Date   HDL 59 10/23/2023   Lab Results  Component Value Date   LDLCALC 107 (H) 10/23/2023   Lab Results  Component Value Date   TRIG 119 10/23/2023   Lab Results  Component Value Date   CHOLHDL 3.2 10/23/2023   Lab Results  Component Value Date   HGBA1C 5.9 (H) 10/23/2023       Assessment & Plan:  Weight loss, unintentional Assessment & Plan: Significant weight loss over the past month associated with anorexia and nausea. No specific food aversions. No dysphagia. No recent illnesses or changes in bowel habits. No pain. Mild dehydration noted on physical exam. -Order comprehensive metabolic panel (CMP) and complete blood count (CBC) to assess for metabolic or hematologic causes. -Order chest x-ray and CT scan of abdomen and pelvis to evaluate for any structural abnormalities or malignancies. -Administer  2 units of normal saline IV for dehydration. -Prescribe Zofran ODT for nausea.  Orders: -     Ambulatory referral to Gastroenterology -     POC Hemoccult Bld/Stl (3-Cd Home Screen); Future -     Ondansetron; Take 1 tablet (4 mg total) by mouth every 8 (eight) hours as needed for nausea or vomiting.  Dispense: 20 tablet; Refill: 0 -     CT ABDOMEN PELVIS WO CONTRAST; Future -     DG Chest 2 View; Future  Generalized abdominal pain Assessment & Plan: Acute - 2 weeks -Order chest x-ray STAT (lungs clear, cardiac or mediastinal contours are normal) -CT scan of abdomen and pelvis to evaluate for any structural abnormalities or malignancies. - not resulted -Referral to gastroenterology  Orders: -     Ambulatory referral to Gastroenterology -     CT ABDOMEN PELVIS WO CONTRAST; Future -     DG Chest 2 View; Future  Dehydration Assessment & Plan: acute -sent to special procedures to administer 2 units of normal saline IV for dehydration -ondansetron (ZOFRAN-ODT) 4 MG disintegrating tablet sent to pharmacy for nausea -POCT UA completed in office,  ketones (trace)    Orders: -     CT ABDOMEN PELVIS WO CONTRAST; Future -     DG Chest 2 View; Future -     POCT URINALYSIS DIP (CLINITEK)  Nausea Assessment & Plan: Labs drawn STAT at specialty procedures, below have resulted  GFR - 46, normal Calcium - 8.7 normal TSH - 0.97 normal T4 - 2.17 normal -Zofran sent to pharmacy  -2L of NS administered by specialty procedures  Orders: -     Ambulatory referral to Gastroenterology -     Ondansetron; Take 1 tablet (4 mg total) by mouth every 8 (eight) hours as needed for nausea or vomiting.  Dispense: 20 tablet; Refill: 0 -     CT ABDOMEN PELVIS WO CONTRAST; Future     Meds ordered this encounter  Medications   DISCONTD: megestrol (MEGACE) 40 MG/ML suspension    Sig: Take 5 mLs (200 mg total) by mouth daily.    Dispense:  240 mL    Refill:  0   ondansetron (ZOFRAN-ODT) 4 MG disintegrating tablet    Sig: Take 1 tablet (4 mg total) by mouth every 8 (eight) hours as needed for nausea or vomiting.    Dispense:  20 tablet    Refill:  0    Orders Placed This Encounter  Procedures   CT ABDOMEN PELVIS WO CONTRAST   DG Chest 2 View   Ambulatory referral to Gastroenterology   POC Hemoccult Bld/Stl (3-Cd Home Screen)   POCT URINALYSIS DIP (CLINITEK)     Follow-up: Return if symptoms worsen or fail to improve.  An After Visit Summary was printed and given to the patient.  Total time spent on today's visit was 59 minutes, including both face-to-face time and nonface-to-face time personally spent on review of chart (labs and imaging), discussing labs and goals, discussing further work-up, treatment options, referrals to specialist if needed, reviewing outside records if pertinent, answering patient's questions, and coordinating care.    Lajuana Matte, FNP [facility] [phone]

## 2023-12-10 NOTE — Assessment & Plan Note (Addendum)
acute -sent to special procedures to administer 2 units of normal saline IV for dehydration -ondansetron (ZOFRAN-ODT) 4 MG disintegrating tablet sent to pharmacy for nausea -POCT UA completed in office, ketones (trace)

## 2023-12-11 ENCOUNTER — Encounter: Payer: Self-pay | Admitting: Acute Care

## 2023-12-11 DIAGNOSIS — R634 Abnormal weight loss: Secondary | ICD-10-CM | POA: Diagnosis not present

## 2023-12-12 ENCOUNTER — Ambulatory Visit: Payer: Medicare Other | Admitting: Nurse Practitioner

## 2023-12-18 ENCOUNTER — Telehealth: Payer: Self-pay

## 2023-12-18 DIAGNOSIS — H16143 Punctate keratitis, bilateral: Secondary | ICD-10-CM | POA: Diagnosis not present

## 2023-12-18 DIAGNOSIS — R109 Unspecified abdominal pain: Secondary | ICD-10-CM | POA: Diagnosis not present

## 2023-12-18 DIAGNOSIS — R140 Abdominal distension (gaseous): Secondary | ICD-10-CM | POA: Diagnosis not present

## 2023-12-18 DIAGNOSIS — R1310 Dysphagia, unspecified: Secondary | ICD-10-CM | POA: Diagnosis not present

## 2023-12-18 DIAGNOSIS — R110 Nausea: Secondary | ICD-10-CM | POA: Diagnosis not present

## 2023-12-18 DIAGNOSIS — R634 Abnormal weight loss: Secondary | ICD-10-CM | POA: Diagnosis not present

## 2023-12-18 NOTE — Telephone Encounter (Signed)
 Copied from CRM [id number]. Topic: Clinical - Medical Advice >> Dec 18, 2023  8:59 AM Ivette P wrote: Reason for CRM: Pt would like a callback about an appt with Gastroenterology that she has coming up. Would like a call back at Number [phone]

## 2023-12-18 NOTE — Telephone Encounter (Signed)
 Called patient back spoke with her husband, looks like she had cancel appointment with GI, but it looks like she has called and rescheduled it in March. Patient will call back if she have any questions, she was in the eye doctor at the time of call.

## 2023-12-25 ENCOUNTER — Other Ambulatory Visit: Payer: Self-pay | Admitting: Family Medicine

## 2023-12-28 ENCOUNTER — Other Ambulatory Visit: Payer: Self-pay | Admitting: Family Medicine

## 2023-12-31 DIAGNOSIS — E785 Hyperlipidemia, unspecified: Secondary | ICD-10-CM | POA: Diagnosis not present

## 2023-12-31 DIAGNOSIS — Z7901 Long term (current) use of anticoagulants: Secondary | ICD-10-CM | POA: Diagnosis not present

## 2023-12-31 DIAGNOSIS — G459 Transient cerebral ischemic attack, unspecified: Secondary | ICD-10-CM | POA: Diagnosis not present

## 2023-12-31 DIAGNOSIS — I2510 Atherosclerotic heart disease of native coronary artery without angina pectoris: Secondary | ICD-10-CM | POA: Diagnosis not present

## 2023-12-31 DIAGNOSIS — Z79899 Other long term (current) drug therapy: Secondary | ICD-10-CM | POA: Diagnosis not present

## 2023-12-31 DIAGNOSIS — N179 Acute kidney failure, unspecified: Secondary | ICD-10-CM | POA: Diagnosis not present

## 2023-12-31 DIAGNOSIS — R4781 Slurred speech: Secondary | ICD-10-CM | POA: Diagnosis not present

## 2023-12-31 DIAGNOSIS — K2270 Barrett's esophagus without dysplasia: Secondary | ICD-10-CM | POA: Diagnosis not present

## 2023-12-31 DIAGNOSIS — N183 Chronic kidney disease, stage 3 unspecified: Secondary | ICD-10-CM | POA: Diagnosis not present

## 2023-12-31 DIAGNOSIS — I639 Cerebral infarction, unspecified: Secondary | ICD-10-CM | POA: Diagnosis not present

## 2023-12-31 DIAGNOSIS — E860 Dehydration: Secondary | ICD-10-CM | POA: Diagnosis not present

## 2023-12-31 DIAGNOSIS — M549 Dorsalgia, unspecified: Secondary | ICD-10-CM | POA: Diagnosis not present

## 2023-12-31 DIAGNOSIS — R112 Nausea with vomiting, unspecified: Secondary | ICD-10-CM | POA: Diagnosis not present

## 2023-12-31 DIAGNOSIS — R410 Disorientation, unspecified: Secondary | ICD-10-CM | POA: Diagnosis not present

## 2023-12-31 DIAGNOSIS — Z87891 Personal history of nicotine dependence: Secondary | ICD-10-CM | POA: Diagnosis not present

## 2023-12-31 DIAGNOSIS — R4182 Altered mental status, unspecified: Secondary | ICD-10-CM | POA: Diagnosis not present

## 2023-12-31 DIAGNOSIS — R270 Ataxia, unspecified: Secondary | ICD-10-CM | POA: Diagnosis not present

## 2023-12-31 DIAGNOSIS — I4891 Unspecified atrial fibrillation: Secondary | ICD-10-CM | POA: Diagnosis not present

## 2023-12-31 DIAGNOSIS — Z7982 Long term (current) use of aspirin: Secondary | ICD-10-CM | POA: Diagnosis not present

## 2023-12-31 DIAGNOSIS — I129 Hypertensive chronic kidney disease with stage 1 through stage 4 chronic kidney disease, or unspecified chronic kidney disease: Secondary | ICD-10-CM | POA: Diagnosis not present

## 2024-01-01 DIAGNOSIS — I340 Nonrheumatic mitral (valve) insufficiency: Secondary | ICD-10-CM | POA: Diagnosis not present

## 2024-01-01 DIAGNOSIS — I4891 Unspecified atrial fibrillation: Secondary | ICD-10-CM | POA: Diagnosis not present

## 2024-01-01 DIAGNOSIS — N179 Acute kidney failure, unspecified: Secondary | ICD-10-CM | POA: Diagnosis not present

## 2024-01-01 DIAGNOSIS — G459 Transient cerebral ischemic attack, unspecified: Secondary | ICD-10-CM | POA: Diagnosis not present

## 2024-01-02 ENCOUNTER — Telehealth: Payer: Self-pay

## 2024-01-02 NOTE — Transitions of Care (Post Inpatient/ED Visit) (Signed)
01/02/2024  Name: Pamela Schwartz MRN: [id number] DOB: 1941-09-16  Today's TOC FU Call Status: Today's TOC FU Call Status:: Successful TOC FU Call Completed TOC FU Call Complete Date: 01/02/24 Patient's Name and Date of Birth confirmed.  Transition Care Management Follow-up Telephone Call Date of Discharge: 01/01/24 Discharge Facility: Other ([facility]) Name of Other ([facility]) Discharge Facility: [facility] Type of Discharge: Emergency Department Reason for ED Visit: Other: (vomiting) How have you been since you were released from the hospital?: Better Any questions or concerns?: No  Items Reviewed: Did you receive and understand the discharge instructions provided?: Yes Medications obtained,verified, and reconciled?: Yes (Medications Reviewed) Any new allergies since your discharge?: No Dietary orders reviewed?: Yes Do you have support at home?: Yes People in Home: spouse  Medications Reviewed Today: Medications Reviewed Today     Reviewed by Karena Addison, LPN (Licensed Practical Nurse) on 01/02/24 at 1252  Med List Status: <None>   Medication Order Taking? Sig Documenting Provider Last Dose Status Informant  allopurinol (ZYLOPRIM) 100 MG tablet [id number] No TAKE ONE TABLET BY MOUTH EVERY EVENING [facility], Kirsten, MD Taking Active   amLODipine (NORVASC) 10 MG tablet [id number] No TAKE ONE TABLET BY MOUTH EVERY Renee Pain, Kirsten, MD Taking Active   aspirin 81 MG tablet [id number] No Take 81 mg by mouth daily. [provider] Taking Active Self  chlorthalidone (HYGROTON) 25 MG tablet [id number] No TAKE ONE TABLET BY MOUTH EVERY DAY [facility], Kirsten, MD Taking Active   ezetimibe (ZETIA) 10 MG tablet [id number] No Take 1 tablet (10 mg total) by mouth daily. Blane Ohara, MD Taking Active   KERENDIA 10 MG TABS [id number] No TAKE ONE TABLET BY MOUTH EVERY DAY [facility], Kirsten, MD Taking Active   levothyroxine (SYNTHROID) 112 MCG tablet [id number]  TAKE ONE TABLET BY MOUTH EVERY  MORNING [facility], Kirsten, MD  Active   lisinopril (ZESTRIL) 20 MG tablet [id number] No TAKE ONE TABLET BY MOUTH EVERY DAY [facility], Kirsten, MD Taking Active   meclizine (ANTIVERT) 25 MG tablet [id number] No Take 1 tablet (25 mg total) by mouth 3 (three) times daily as needed for dizziness. [facility], Kirsten, MD Taking Active   metoprolol succinate (TOPROL-XL) 100 MG 24 hr tablet [id number] No TAKE 2 TABLETS BY MOUTH EVERY DAY [facility], Kirsten, MD Taking Active   ondansetron (ZOFRAN-ODT) 4 MG disintegrating tablet [id number]  Take 1 tablet (4 mg total) by mouth every 8 (eight) hours as needed for nausea or vomiting. Renne Crigler, FNP  Active   pantoprazole (PROTONIX) 40 MG tablet [id number] No TAKE ONE TABLET BY MOUTH EVERY EVENING [facility], Kirsten, MD Taking Active   potassium chloride (KLOR-CON) 10 MEQ tablet [id number] No TAKE ONE TABLET BY MOUTH EVERY DAY [facility], Kirsten, MD Taking Active   promethazine (PHENERGAN) 25 MG tablet [id number] No TAKE ONE TABLET BY MOUTH 4 TIMES DAILY AS NEEDED FOR NAUSEA AND VOMITING Windell Moment, MD Taking Active   rosuvastatin (CRESTOR) 40 MG tablet [id number]  TAKE ONE TABLET BY MOUTH EVERY Renee Pain, Kirsten, MD  Active   traMADol (ULTRAM) 50 MG tablet [id number] No Take 1 tablet (50 mg total) by mouth every 8 (eight) hours as needed for severe pain (pain score 7-10) (back pain). Blane Ohara, MD Taking Active   VASCEPA 1 g capsule [id number] No TAKE 2 CAPSULES BY MOUTH TWICE DAILY [facility], Kirsten, MD Taking Active   zolpidem (AMBIEN CR) 12.5 MG CR tablet [id number] No TAKE ONE TABLET BY MOUTH AT BEDTIME Blane Ohara, MD Taking Active  Home Care and Equipment/Supplies: Were Home Health Services Ordered?: NA Any new equipment or medical supplies ordered?: NA  Functional Questionnaire: Do you need assistance with bathing/showering or dressing?: No Do you need assistance with meal preparation?: No Do you need assistance with eating?: No Do you have difficulty maintaining  continence: No Do you need assistance with getting out of bed/getting out of a chair/moving?: No Do you have difficulty managing or taking your medications?: No  Follow up appointments reviewed: PCP Follow-up appointment confirmed?: Yes Date of PCP follow-up appointment?: 01/03/24 Follow-up Provider: [facility] Follow-up appointment confirmed?: NA Do you need transportation to your follow-up appointment?: No Do you understand care options if your condition(s) worsen?: Yes-patient verbalized understanding    SIGNATURE Karena Addison, LPN [facility] Nurse Health Advisor Direct Dial [phone]

## 2024-01-03 ENCOUNTER — Telehealth: Payer: Self-pay

## 2024-01-03 ENCOUNTER — Encounter: Payer: Self-pay | Admitting: Family Medicine

## 2024-01-03 ENCOUNTER — Ambulatory Visit (INDEPENDENT_AMBULATORY_CARE_PROVIDER_SITE_OTHER): Payer: Medicare Other | Admitting: Family Medicine

## 2024-01-03 VITALS — BP 120/76 | HR 79 | Temp 97.6°F | Ht 65.0 in | Wt 131.0 lb

## 2024-01-03 DIAGNOSIS — R404 Transient alteration of awareness: Secondary | ICD-10-CM | POA: Diagnosis not present

## 2024-01-03 DIAGNOSIS — Z8679 Personal history of other diseases of the circulatory system: Secondary | ICD-10-CM | POA: Diagnosis not present

## 2024-01-03 DIAGNOSIS — R110 Nausea: Secondary | ICD-10-CM

## 2024-01-03 DIAGNOSIS — I129 Hypertensive chronic kidney disease with stage 1 through stage 4 chronic kidney disease, or unspecified chronic kidney disease: Secondary | ICD-10-CM

## 2024-01-03 DIAGNOSIS — N184 Chronic kidney disease, stage 4 (severe): Secondary | ICD-10-CM

## 2024-01-03 NOTE — Assessment & Plan Note (Addendum)
 Well controlled.  The current medical regimen is effective;  continue present plan and medications. Check labs

## 2024-01-03 NOTE — Telephone Encounter (Signed)
Copied from CRM [id number]. Topic: General - Other >> Jan 03, 2024 10:04 AM Kristie Cowman wrote: Reason for CRM: Dr. Orvan Falconer (patient's son) called in and wishes to speak with Dr. Sedalia Muta today regarding his mom's MRI findings. Return number - [phone]

## 2024-01-03 NOTE — Progress Notes (Signed)
 Subjective:  Patient ID: Pamela Schwartz, female    DOB: 04-Dec-1940  Age: 83 y.o. MRN: [id number]  Chief Complaint  Patient presents with   Hospitalization Follow-up    HPI Patient is an 83 year old white female who presents for hospital follow-up.  Patient was admitted December 31, 2023 to January 01, 2024 at [facility].  Patient is a retired cardiac nurse she has a past medical history of hypertensive chronic kidney disease stage III, hyperlipidemia, paroxysmal atrial fibrillation not on anticoagulation GERD with Barrett's esophagus who presented from home via EMS following altered mental status.  Her spouse found her going through doors in the kitchen and acting abnormal.  There was some liquor on the counter however patient denied drinking at.  Over the 3 weeks prior to her admission she been having nausea and not eating well and some occasional vomiting as well as weight loss.  She had been scheduled to get an EGD next week by her gastroenterologist, Dr. Jennye Boroughs.  Dr. Sedalia Muta had's scheduled her for outpatient IV fluids during which she received 2 L and did feel better on    .  Routine labs On admission CBC normal.  CMP showed elevated BUN and creatinine with a GFR down of 30 lactic acid was 1.9 gradually dropped to 1.  Troponins were within normal limits x 2.  Thyroid was normal.  Pro-Cal told it was 0.07.  UA was negative.  UDS and alcohol levels were negative.  Chest x-ray was negative.  EKG normal sinus rhythm.  Head CT and MRI of her brain showed mild bilateral small vessel ischemic changes.  CTA showed mild atherosclerotic calcifications. Vitals on admission showed hide blood pressure 155/72.  Diagnosis on admission include altered mental status, acute dehydration, acute kidney injury, possible TIA.  Although telemetry was normal she has a history of atrial fibrillation. CAD echo with bubble study was normal.  CHA2DS2-VASc score is now 5 patient is agreeable to start  anticoagulation.  Eliquis was sent to the pharmacy.  Physical therapy was recommended.  Outpatient is fine. Recommended to restart several of her medicines that she had not been taking because she was nauseated.  These include metoprolol, Vascepa, rosuvastatin.  On Megace due to her decreased appetite and recommended to keep her GI appointment with Dr. Jennye Boroughs.      07/18/2023    8:47 AM 03/21/2023    8:37 AM 03/21/2022    3:34 PM 10/17/2021    7:41 AM 07/13/2021    7:41 AM  Depression screen PHQ 2/9  Decreased Interest 0 0 0 0 0  Down, Depressed, Hopeless 0 0 0 0 0  PHQ - 2 Score 0 0 0 0 0        01/03/2024    9:30 AM  Fall Risk   Falls in the past year? 1  Number falls in past yr: 0  Injury with Fall? 0  Risk for fall due to : No Fall Risks  Follow up Falls evaluation completed    Patient Care Team: Blane Ohara, MD as PCP - General (Family Medicine) Tyler Pita, MD as Attending Physician (Internal Medicine) Estill Bamberg, MD as Consulting Physician (Orthopedic Surgery) Claria Dice, MD as Attending Physician (Physical Medicine and Rehabilitation) Park Liter, DPM (Inactive) as Consulting Physician (Podiatry)   Review of Systems  Constitutional:  Negative for chills, fatigue and fever.  HENT:  Negative for congestion, ear pain and sore throat.   Respiratory:  Negative for cough and shortness of breath.  Cardiovascular:  Negative for chest pain.  Gastrointestinal:  Negative for abdominal pain, constipation, diarrhea, nausea and vomiting.  Genitourinary:  Negative for dysuria and urgency.  Musculoskeletal:  Negative for arthralgias and myalgias.  Skin:  Negative for rash.  Neurological:  Negative for dizziness and headaches.  Psychiatric/Behavioral:  Negative for dysphoric mood. The patient is not nervous/anxious.     Current Outpatient Medications on File Prior to Visit  Medication Sig Dispense Refill   ELIQUIS 2.5 MG TABS tablet Take 2.5 mg by mouth 2 (two)  times daily.     allopurinol (ZYLOPRIM) 100 MG tablet TAKE ONE TABLET BY MOUTH EVERY EVENING 90 tablet 1   amLODipine (NORVASC) 10 MG tablet TAKE ONE TABLET BY MOUTH EVERY EVENING 90 tablet 1   chlorthalidone (HYGROTON) 25 MG tablet TAKE ONE TABLET BY MOUTH EVERY DAY 90 tablet 1   ezetimibe (ZETIA) 10 MG tablet Take 1 tablet (10 mg total) by mouth daily. 90 tablet 0   KERENDIA 10 MG TABS TAKE ONE TABLET BY MOUTH EVERY DAY 30 tablet 3   levothyroxine (SYNTHROID) 112 MCG tablet TAKE ONE TABLET BY MOUTH EVERY MORNING 90 tablet 1   lisinopril (ZESTRIL) 20 MG tablet TAKE ONE TABLET BY MOUTH EVERY DAY 90 tablet 1   meclizine (ANTIVERT) 25 MG tablet Take 1 tablet (25 mg total) by mouth 3 (three) times daily as needed for dizziness. 30 tablet 2   metoprolol succinate (TOPROL-XL) 100 MG 24 hr tablet TAKE 2 TABLETS BY MOUTH EVERY DAY 180 tablet 1   ondansetron (ZOFRAN-ODT) 4 MG disintegrating tablet Take 1 tablet (4 mg total) by mouth every 8 (eight) hours as needed for nausea or vomiting. 20 tablet 0   pantoprazole (PROTONIX) 40 MG tablet TAKE ONE TABLET BY MOUTH EVERY EVENING 90 tablet 3   potassium chloride (KLOR-CON) 10 MEQ tablet TAKE ONE TABLET BY MOUTH EVERY DAY 90 tablet 1   promethazine (PHENERGAN) 25 MG tablet TAKE ONE TABLET BY MOUTH 4 TIMES DAILY AS NEEDED FOR NAUSEA AND VOMITING 60 tablet 3   rosuvastatin (CRESTOR) 40 MG tablet TAKE ONE TABLET BY MOUTH EVERY EVENING 90 tablet 1   traMADol (ULTRAM) 50 MG tablet Take 1 tablet (50 mg total) by mouth every 8 (eight) hours as needed for severe pain (pain score 7-10) (back pain). 90 tablet 3   VASCEPA 1 g capsule TAKE 2 CAPSULES BY MOUTH TWICE DAILY 360 capsule 1   zolpidem (AMBIEN CR) 12.5 MG CR tablet TAKE ONE TABLET BY MOUTH AT BEDTIME 30 tablet 5   No current facility-administered medications on file prior to visit.   Past Medical History:  Diagnosis Date   Abdominal aortic atherosclerosis (HCC) 03/02/2020   Age-related osteoporosis without  current pathological fracture    Arthritis    Barrett's esophagus without dysplasia    Benign positional vertigo, bilateral 03/21/2023   Bilateral carpal tunnel syndrome 07/03/2018   Bilateral hand pain 07/03/2018   Cancer (HCC)    denies   Cervical spinal stenosis 03/06/2016   Chronic idiopathic gout involving toe of right foot without tophus 09/29/2020   Chronic kidney disease    Chronic pain syndrome 03/11/2022   Diabetes mellitus with stage 3 chronic kidney disease (HCC) 07/17/2021   Diabetic glomerulopathy (HCC) 12/16/2022   Exostosis 07/16/2017   Flu vaccine need 07/17/2021   Foot pain, bilateral 03/02/2020   GERD (gastroesophageal reflux disease)    Hearing loss    History of COVID-19    Hypercholesterolemia    Hypertension  Hypertensive chronic kidney disease with stage 1 through stage 4 chronic kidney disease, or unspecified chronic kidney disease    Hypertensive kidney disease with stage 4 chronic kidney disease (HCC) 12/16/2022   IBS (irritable bowel syndrome)    Low back pain    Lumbar pain 03/02/2020   Mixed hyperlipidemia    Paresthesia of skin    Paroxysmal atrial fibrillation (HCC) 04/28/2022   Post-surgical hypothyroidism 12/16/2022   Primary insomnia    Scars    Secondary DM with CKD stage 4 and hypertension (HCC) 12/16/2022   Telogen effluvium 03/02/2020   Vitamin D deficiency, unspecified    Past Surgical History:  Procedure Laterality Date   ANTERIOR CERVICAL DECOMP/DISCECTOMY FUSION N/A 03/06/2016   Procedure: ACDF - C5-C6 - C6-C7  ;  Surgeon: Julio Sicks, MD;  Location: [facility];  Service: Neurosurgery;  Laterality: N/A;  ACDF - C5-C6 - C6-C7     APPENDECTOMY     BUNIONECTOMY     right and left   CARDIAC CATHETERIZATION     had in [place] around 2012   CATARACT EXTRACTION W/ INTRAOCULAR LENS  IMPLANT, BILATERAL     CESAREAN SECTION     COLONOSCOPY W/ BIOPSIES AND POLYPECTOMY     MULTIPLE TOOTH EXTRACTIONS     TUBAL LIGATION      Family  History  Problem Relation Age of Onset   Heart attack Mother    Heart attack Father    Hypertension Sister    Hypertension Brother    Renal Disease Brother    Heart attack Brother    Multiple myeloma Brother    Hypertension Sister    Hypertension Sister    Heart attack Sister    Social History   Socioeconomic History   Marital status: Married    Spouse name: Not on file   Number of children: 3   Years of education: Not on file   Highest education level: Not on file  Occupational History   Occupation: Retired Charity fundraiser  Tobacco Use   Smoking status: Former    Current packs/day: 0.00    Types: Cigarettes    Quit date: 10/02/2015    Years since quitting: 8.2   Smokeless tobacco: Never  Vaping Use   Vaping status: Never Used  Substance and Sexual Activity   Alcohol use: No   Drug use: No   Sexual activity: Not on file  Other Topics Concern   Not on file  Social History Narrative   Not on file   Social Drivers of Health   Financial Resource Strain: Low Risk  (03/21/2023)   Overall Financial Resource Strain (CARDIA)    Difficulty of Paying Living Expenses: Not hard at all  Food Insecurity: No Food Insecurity (10/23/2023)   Hunger Vital Sign    Worried About Running Out of Food in the Last Year: Never true    Ran Out of Food in the Last Year: Never true  Transportation Needs: No Transportation Needs (10/23/2023)   PRAPARE - Administrator, Civil Service (Medical): No    Lack of Transportation (Non-Medical): No  Physical Activity: Insufficiently Active (10/23/2023)   Exercise Vital Sign    Days of Exercise per Week: 3 days    Minutes of Exercise per Session: 30 min  Stress: No Stress Concern Present (10/23/2023)   [employer] of Occupational Health - Occupational Stress Questionnaire    Feeling of Stress : Not at all  Social Connections: Moderately Isolated (03/21/2023)   Social  Connection and Isolation Panel [NHANES]    Frequency of Communication with  Friends and Family: More than three times a week    Frequency of Social Gatherings with Friends and Family: Three times a week    Attends Religious Services: Never    Active Member of Clubs or Organizations: No    Attends Engineer, structural: Never    Marital Status: Married    Objective:  BP 120/76   Pulse 79   Temp 97.6 F (36.4 C)   Ht 5\' 5"  (1.651 m)   Wt 131 lb (59.4 kg)   SpO2 97%   BMI 21.80 kg/m      01/03/2024    9:22 AM 12/10/2023    8:20 AM 10/23/2023   10:49 AM  BP/Weight  Systolic BP 120 148 110  Diastolic BP 76 78 64  Wt. (Lbs) 131 131 138  BMI 21.8 kg/m2 21.8 kg/m2 22.96 kg/m2    Physical Exam Vitals reviewed.  Constitutional:      Appearance: Normal appearance. She is normal weight.  Neck:     Vascular: No carotid bruit.  Cardiovascular:     Rate and Rhythm: Normal rate and regular rhythm.     Heart sounds: Normal heart sounds.  Pulmonary:     Effort: Pulmonary effort is normal. No respiratory distress.     Breath sounds: Normal breath sounds.  Abdominal:     General: Abdomen is flat. Bowel sounds are normal.     Palpations: Abdomen is soft.     Tenderness: There is no abdominal tenderness.  Neurological:     Mental Status: She is alert and oriented to person, place, and time.  Psychiatric:        Mood and Affect: Mood normal.        Behavior: Behavior normal.     Diabetic Foot Exam - Simple   No data filed      Lab Results  Component Value Date   WBC 9.6 01/03/2024   HGB 14.3 01/03/2024   HCT 43.8 01/03/2024   PLT 212 01/03/2024   GLUCOSE 96 01/03/2024   CHOL 187 10/23/2023   TRIG 119 10/23/2023   HDL 59 10/23/2023   LDLCALC 107 (H) 10/23/2023   ALT 21 01/03/2024   AST 23 01/03/2024   NA 142 01/03/2024   K 4.8 01/03/2024   CL 104 01/03/2024   CREATININE 1.73 (H) 01/03/2024   BUN 27 01/03/2024   CO2 25 01/03/2024   TSH 0.97 12/10/2023   HGBA1C 5.9 (H) 10/23/2023   MICROALBUR 150 10/17/2021      Assessment &  Plan:    Hypertensive kidney disease with stage 4 chronic kidney disease (HCC) Assessment & Plan: Check labs  Orders: -     CBC with Differential/Platelet -     Comprehensive metabolic panel  Nausea Assessment & Plan: Improved. Keep GI appt coming up.     Transient alteration of awareness  History of atrial fibrillation     No orders of the defined types were placed in this encounter.   Orders Placed This Encounter  Procedures   CBC with Differential/Platelet   Comprehensive metabolic panel     Follow-up: No follow-ups on file.   I,Bryannah Boston,acting as a Neurosurgeon for Blane Ohara, MD.,have documented all relevant documentation on the behalf of Blane Ohara, MD,as directed by  Blane Ohara, MD while in the presence of Blane Ohara, MD.   Clayborn Bigness I Leal-Borjas,acting as a scribe for Blane Ohara, MD.,have documented all  relevant documentation on the behalf of Blane Ohara, MD,as directed by  Blane Ohara, MD while in the presence of Blane Ohara, MD.    An After Visit Summary was printed and given to the patient.  I attest that I have reviewed this visit and agree with the plan scribed by my staff.   Blane Ohara, MD [facility] [phone]

## 2024-01-04 LAB — CBC WITH DIFFERENTIAL/PLATELET
Basophils Absolute: 0.1 10*3/uL (ref 0.0–0.2)
Basos: 1 %
EOS (ABSOLUTE): 0.4 10*3/uL (ref 0.0–0.4)
Eos: 4 %
Hematocrit: 43.8 % (ref 34.0–46.6)
Hemoglobin: 14.3 g/dL (ref 11.1–15.9)
Immature Grans (Abs): 0 10*3/uL (ref 0.0–0.1)
Immature Granulocytes: 0 %
Lymphocytes Absolute: 1.9 10*3/uL (ref 0.7–3.1)
Lymphs: 20 %
MCH: 30.1 pg (ref 26.6–33.0)
MCHC: 32.6 g/dL (ref 31.5–35.7)
MCV: 92 fL (ref 79–97)
Monocytes Absolute: 0.9 10*3/uL (ref 0.1–0.9)
Monocytes: 9 %
Neutrophils Absolute: 6.3 10*3/uL (ref 1.4–7.0)
Neutrophils: 66 %
Platelets: 212 10*3/uL (ref 150–450)
RBC: 4.75 x10E6/uL (ref 3.77–5.28)
RDW: 12.8 % (ref 11.7–15.4)
WBC: 9.6 10*3/uL (ref 3.4–10.8)

## 2024-01-04 LAB — COMPREHENSIVE METABOLIC PANEL
ALT: 21 [IU]/L (ref 0–32)
AST: 23 [IU]/L (ref 0–40)
Albumin: 4.2 g/dL (ref 3.8–4.8)
Alkaline Phosphatase: 107 [IU]/L (ref 44–121)
BUN/Creatinine Ratio: 16 (ref 12–28)
BUN: 27 mg/dL (ref 8–27)
Bilirubin Total: 0.4 mg/dL (ref 0.0–1.2)
CO2: 25 mmol/L (ref 20–29)
Calcium: 9.6 mg/dL (ref 8.7–10.3)
Chloride: 104 mmol/L (ref 96–106)
Creatinine, Ser: 1.73 mg/dL — ABNORMAL HIGH (ref 0.57–1.00)
Globulin, Total: 2.3 g/dL (ref 1.5–4.5)
Glucose: 96 mg/dL (ref 70–99)
Potassium: 4.8 mmol/L (ref 3.5–5.2)
Sodium: 142 mmol/L (ref 134–144)
Total Protein: 6.5 g/dL (ref 6.0–8.5)
eGFR: 30 mL/min/{1.73_m2} — ABNORMAL LOW (ref >59)

## 2024-01-04 NOTE — Telephone Encounter (Signed)
 Done. Dr. Sedalia Muta

## 2024-01-05 DIAGNOSIS — Z8679 Personal history of other diseases of the circulatory system: Secondary | ICD-10-CM | POA: Insufficient documentation

## 2024-01-05 DIAGNOSIS — R404 Transient alteration of awareness: Secondary | ICD-10-CM | POA: Insufficient documentation

## 2024-01-05 NOTE — Assessment & Plan Note (Addendum)
 Scheduled to see Dr. Bing Matter. Consider holtor monitor. I am not convinced she needs eliquis 2.5 mg twice daily, but at this point she is not at risk for falls and her CHA2DS2-VASc score is a 5, so will ere on the side of treatment.

## 2024-01-05 NOTE — Assessment & Plan Note (Signed)
 Improved. Keep GI appt coming up.

## 2024-01-05 NOTE — Assessment & Plan Note (Addendum)
 Refer to neurology at [facility].  Stroke work up was negative in the hospital.  Symptoms were concerning for TIA, but also could be related to her [place]. Her son had reported that she was found making breakfast in the middle of the night. I will reinforce that Remus Loffler should be taken immediately before going to bed to limit night time confusion.

## 2024-01-13 ENCOUNTER — Other Ambulatory Visit: Payer: Self-pay | Admitting: Family Medicine

## 2024-01-28 ENCOUNTER — Ambulatory Visit: Payer: Medicare Other | Admitting: Gastroenterology

## 2024-01-28 DIAGNOSIS — M6281 Muscle weakness (generalized): Secondary | ICD-10-CM | POA: Diagnosis not present

## 2024-01-28 DIAGNOSIS — R2689 Other abnormalities of gait and mobility: Secondary | ICD-10-CM | POA: Diagnosis not present

## 2024-01-28 DIAGNOSIS — G450 Vertebro-basilar artery syndrome: Secondary | ICD-10-CM | POA: Diagnosis not present

## 2024-02-04 NOTE — Progress Notes (Unsigned)
 Subjective:  Patient ID: Pamela Schwartz, female    DOB: 11-02-1941  Age: 83 y.o. MRN: [id number]  Chief Complaint  Patient presents with   Medical Management of Chronic Issues   Discussed the use of AI scribe software for clinical note transcription with the patient, who gave verbal consent to proceed.  History of Present Illness   The patient, with a history of diabetes, hypertension, hyperlipidemia, hypothyroidism, and a brief episode of atrial fibrillation, presents for a follow-up after a recent hospitalization. She reports feeling 'real good' and has been eating better in terms of both quantity and quality. She is on a regimen of rosuvastatin, Vascepa, lisinopril, metoprolol, chlorthalidone, amlodipine, baby aspirin, thyroid medication, Toprol XL, tramadol, Tylenol, and Ambien. The patient takes Ambien right before bed and denies any adverse effects. She denies any current symptoms such as fevers, chills, sweats, earaches, sore throat, stuffy nose, chest pain, breathing problems, bowel problems, or bladder issues. The patient also reports regular exercise at a local gym.      Diabetes:  Complications: CKD and glomerulopathy.  Glucose checking: not checking Most recent A1C: 5.9 Current medications: none.  Last Eye Exam: June 2024 Foot checks:daily   Hyperlipidemia: Current medications: On rosuvastatin 40 mg daily and on vascepa 1 gm 2 capsules twice daily.    Hypertensive CKD: Complications: Last GFR 31 Current medications: lisinopril 20 mg daily, metoprolol succinate 200 mg once daily, chlorthalidone 25 mg daily, amlodipine 10 mg daily, kerendia 10 mg daily, aspirin 81 mg daily.  Patient has seen nephrology.   Hypothyroidism: on synthroid 112 mcg once daily in am.    Atrial fibrillation related to acute illness: on toprol xl 100 mg twice daily. No blood thinners recommended.    Diet: healthy Exercise: active   Back Pain: on tramadol 50 mg twice daily. Decreased by  nephrology. Recommended to take tylenol more if needed. Doing well.   Insomnia: Ambien cr 12.5 mg one before bed.       02/05/2024    8:54 AM 07/18/2023    8:47 AM 03/21/2023    8:37 AM 03/21/2022    3:34 PM 10/17/2021    7:41 AM  Depression screen PHQ 2/9  Decreased Interest 0 0 0 0 0  Down, Depressed, Hopeless 0 0 0 0 0  PHQ - 2 Score 0 0 0 0 0        01/03/2024    9:30 AM  Fall Risk   Falls in the past year? 1  Number falls in past yr: 0  Injury with Fall? 0  Risk for fall due to : No Fall Risks  Follow up Falls evaluation completed    Patient Care Team: Blane Ohara, MD as PCP - General (Family Medicine) Tyler Pita, MD as Attending Physician (Internal Medicine) Estill Bamberg, MD as Consulting Physician (Orthopedic Surgery) Claria Dice, MD as Attending Physician (Physical Medicine and Rehabilitation) Park Liter, DPM (Inactive) as Consulting Physician (Podiatry)   Review of Systems  Constitutional:  Negative for chills, fatigue and fever.  HENT:  Negative for congestion, ear pain, rhinorrhea and sore throat.   Respiratory:  Negative for cough and shortness of breath.   Cardiovascular:  Negative for chest pain.  Gastrointestinal:  Negative for abdominal pain, constipation, diarrhea, nausea and vomiting.  Genitourinary:  Negative for dysuria and urgency.  Musculoskeletal:  Negative for back pain and myalgias.  Neurological:  Negative for dizziness, weakness, light-headedness and headaches.  Psychiatric/Behavioral:  Negative for dysphoric mood. The patient is  not nervous/anxious.     Current Outpatient Medications on File Prior to Visit  Medication Sig Dispense Refill   allopurinol (ZYLOPRIM) 100 MG tablet TAKE ONE TABLET BY MOUTH EVERY EVENING 90 tablet 1   amLODipine (NORVASC) 10 MG tablet TAKE ONE TABLET BY MOUTH EVERY EVENING 90 tablet 1   chlorthalidone (HYGROTON) 25 MG tablet TAKE ONE TABLET BY MOUTH EVERY DAY 90 tablet 1   ELIQUIS 2.5 MG TABS tablet Take  2.5 mg by mouth 2 (two) times daily.     ezetimibe (ZETIA) 10 MG tablet TAKE ONE TABLET BY MOUTH EVERY DAY 90 tablet 1   KERENDIA 10 MG TABS TAKE ONE TABLET BY MOUTH EVERY DAY 30 tablet 3   levothyroxine (SYNTHROID) 112 MCG tablet TAKE ONE TABLET BY MOUTH EVERY MORNING 90 tablet 1   lisinopril (ZESTRIL) 20 MG tablet TAKE ONE TABLET BY MOUTH EVERY DAY 90 tablet 1   meclizine (ANTIVERT) 25 MG tablet Take 1 tablet (25 mg total) by mouth 3 (three) times daily as needed for dizziness. 30 tablet 2   metoprolol succinate (TOPROL-XL) 100 MG 24 hr tablet TAKE 2 TABLETS BY MOUTH EVERY DAY 180 tablet 1   ondansetron (ZOFRAN-ODT) 4 MG disintegrating tablet Take 1 tablet (4 mg total) by mouth every 8 (eight) hours as needed for nausea or vomiting. 20 tablet 0   pantoprazole (PROTONIX) 40 MG tablet TAKE ONE TABLET BY MOUTH EVERY EVENING 90 tablet 3   potassium chloride (KLOR-CON) 10 MEQ tablet TAKE ONE TABLET BY MOUTH EVERY DAY 90 tablet 1   promethazine (PHENERGAN) 25 MG tablet TAKE ONE TABLET BY MOUTH 4 TIMES DAILY AS NEEDED FOR NAUSEA AND VOMITING 60 tablet 3   rosuvastatin (CRESTOR) 40 MG tablet TAKE ONE TABLET BY MOUTH EVERY EVENING 90 tablet 1   traMADol (ULTRAM) 50 MG tablet Take 1 tablet (50 mg total) by mouth every 8 (eight) hours as needed for severe pain (pain score 7-10) (back pain). 90 tablet 3   VASCEPA 1 g capsule TAKE 2 CAPSULES BY MOUTH TWICE DAILY 360 capsule 1   zolpidem (AMBIEN CR) 12.5 MG CR tablet TAKE ONE TABLET BY MOUTH AT BEDTIME 30 tablet 5   No current facility-administered medications on file prior to visit.   Past Medical History:  Diagnosis Date   Abdominal aortic atherosclerosis (HCC) 03/02/2020   Age-related osteoporosis without current pathological fracture    Arthritis    Barrett's esophagus without dysplasia    Benign positional vertigo, bilateral 03/21/2023   Bilateral carpal tunnel syndrome 07/03/2018   Bilateral hand pain 07/03/2018   Cancer (HCC)    denies    Cervical spinal stenosis 03/06/2016   Chronic idiopathic gout involving toe of right foot without tophus 09/29/2020   Chronic kidney disease    Chronic pain syndrome 03/11/2022   Diabetes mellitus with stage 3 chronic kidney disease (HCC) 07/17/2021   Diabetic glomerulopathy (HCC) 12/16/2022   Exostosis 07/16/2017   Flu vaccine need 07/17/2021   Foot pain, bilateral 03/02/2020   GERD (gastroesophageal reflux disease)    Hearing loss    History of COVID-19    Hypercholesterolemia    Hypertension    Hypertensive chronic kidney disease with stage 1 through stage 4 chronic kidney disease, or unspecified chronic kidney disease    Hypertensive kidney disease with stage 4 chronic kidney disease (HCC) 12/16/2022   IBS (irritable bowel syndrome)    Low back pain    Lumbar pain 03/02/2020   Mixed hyperlipidemia    Paresthesia  of skin    Paroxysmal atrial fibrillation (HCC) 04/28/2022   Post-surgical hypothyroidism 12/16/2022   Primary insomnia    Scars    Secondary DM with CKD stage 4 and hypertension (HCC) 12/16/2022   Telogen effluvium 03/02/2020   Vitamin D deficiency, unspecified    Past Surgical History:  Procedure Laterality Date   ANTERIOR CERVICAL DECOMP/DISCECTOMY FUSION N/A 03/06/2016   Procedure: ACDF - C5-C6 - C6-C7  ;  Surgeon: Julio Sicks, MD;  Location: [facility];  Service: Neurosurgery;  Laterality: N/A;  ACDF - C5-C6 - C6-C7     APPENDECTOMY     BUNIONECTOMY     right and left   CARDIAC CATHETERIZATION     had in [place] around 2012   CATARACT EXTRACTION W/ INTRAOCULAR LENS  IMPLANT, BILATERAL     CESAREAN SECTION     COLONOSCOPY W/ BIOPSIES AND POLYPECTOMY     MULTIPLE TOOTH EXTRACTIONS     TUBAL LIGATION      Family History  Problem Relation Age of Onset   Heart attack Mother    Heart attack Father    Hypertension Sister    Hypertension Brother    Renal Disease Brother    Heart attack Brother    Multiple myeloma Brother    Hypertension Sister     Hypertension Sister    Heart attack Sister    Social History   Socioeconomic History   Marital status: Married    Spouse name: Not on file   Number of children: 3   Years of education: Not on file   Highest education level: Not on file  Occupational History   Occupation: Retired Charity fundraiser  Tobacco Use   Smoking status: Former    Current packs/day: 0.00    Types: Cigarettes    Quit date: 10/02/2015    Years since quitting: 8.3   Smokeless tobacco: Never  Vaping Use   Vaping status: Never Used  Substance and Sexual Activity   Alcohol use: No   Drug use: No   Sexual activity: Not on file  Other Topics Concern   Not on file  Social History Narrative   Not on file   Social Drivers of Health   Financial Resource Strain: Low Risk  (03/21/2023)   Overall Financial Resource Strain (CARDIA)    Difficulty of Paying Living Expenses: Not hard at all  Food Insecurity: No Food Insecurity (02/05/2024)   Hunger Vital Sign    Worried About Running Out of Food in the Last Year: Never true    Ran Out of Food in the Last Year: Never true  Transportation Needs: No Transportation Needs (02/05/2024)   PRAPARE - Administrator, Civil Service (Medical): No    Lack of Transportation (Non-Medical): No  Physical Activity: Insufficiently Active (10/23/2023)   Exercise Vital Sign    Days of Exercise per Week: 3 days    Minutes of Exercise per Session: 30 min  Stress: No Stress Concern Present (10/23/2023)   [employer] of Occupational Health - Occupational Stress Questionnaire    Feeling of Stress : Not at all  Social Connections: Moderately Isolated (02/05/2024)   Social Connection and Isolation Panel [NHANES]    Frequency of Communication with Friends and Family: More than three times a week    Frequency of Social Gatherings with Friends and Family: More than three times a week    Attends Religious Services: Never    Database administrator or Organizations: No  Attends Tax inspector Meetings: Never    Marital Status: Married    Objective:  BP 122/68   Pulse 78   Temp 98.2 F (36.8 C)   Ht 5\' 5"  (1.651 m)   Wt 140 lb (63.5 kg)   SpO2 100%   BMI 23.30 kg/m      02/05/2024    8:49 AM 01/03/2024    9:22 AM 12/10/2023    8:20 AM  BP/Weight  Systolic BP 122 120 148  Diastolic BP 68 76 78  Wt. (Lbs) 140 131 131  BMI 23.3 kg/m2 21.8 kg/m2 21.8 kg/m2    Physical Exam Vitals reviewed.  Constitutional:      Appearance: Normal appearance. She is normal weight.  Neck:     Vascular: No carotid bruit.  Cardiovascular:     Rate and Rhythm: Normal rate and regular rhythm.     Heart sounds: Normal heart sounds.  Pulmonary:     Effort: Pulmonary effort is normal. No respiratory distress.     Breath sounds: Normal breath sounds.  Abdominal:     General: Abdomen is flat. Bowel sounds are normal.     Palpations: Abdomen is soft.     Tenderness: There is no abdominal tenderness.  Neurological:     Mental Status: She is alert and oriented to person, place, and time.  Psychiatric:        Mood and Affect: Mood normal.        Behavior: Behavior normal.     Diabetic Foot Exam - Simple   Simple Foot Form  02/05/2024  9:36 PM  Visual Inspection No deformities, no ulcerations, no other skin breakdown bilaterally: Yes Sensation Testing Intact to touch and monofilament testing bilaterally: Yes Pulse Check Posterior Tibialis and Dorsalis pulse intact bilaterally: Yes Comments      Lab Results  Component Value Date   WBC 9.6 01/03/2024   HGB 14.3 01/03/2024   HCT 43.8 01/03/2024   PLT 212 01/03/2024   GLUCOSE 95 02/05/2024   CHOL 192 02/05/2024   TRIG 128 02/05/2024   HDL 70 02/05/2024   LDLCALC 100 (H) 02/05/2024   ALT 34 (H) 02/05/2024   AST 31 02/05/2024   NA 142 02/05/2024   K 4.6 02/05/2024   CL 103 02/05/2024   CREATININE 1.44 (H) 02/05/2024   BUN 22 02/05/2024   CO2 22 02/05/2024   TSH 0.97 12/10/2023   HGBA1C 5.6 02/05/2024    MICROALBUR 150 10/17/2021      Assessment & Plan:   Paroxysmal atrial fibrillation (HCC) Assessment & Plan: On metoprolol xl 100 mg 2 daily.    Barrett's esophagus without dysplasia Assessment & Plan: The current medical regimen is effective;  continue present plan and medications.  Pantoprazole 40 mg daily.   Post-surgical hypothyroidism Assessment & Plan: Well controlled Continue Synthroid at current dose      Diabetic glomerulopathy [facility]) Assessment & Plan: Control: good No medicines Recommend check feet daily. Recommend annual eye exams. Continue to work on eating a healthy diet and exercise.  Labs drawn today    Orders: -     Hemoglobin A1c -     Microalbumin / creatinine urine ratio  Hypertensive kidney disease with stage 4 chronic kidney disease (HCC) Assessment & Plan: Well controlled.  The current medical regimen is effective;  continue present plan and medications. On  Lisinopril 20 mg daily, metoprolol succinate 200 mg once daily, chlorthalidone 25 mg daily, amlodipine 10 mg daily, kerendia 10 mg daily,  aspirin 81 mg daily.    Orders: -     Comprehensive metabolic panel with GFR  Mixed hyperlipidemia Assessment & Plan: Not at goal.  Recommend start on zetia 10 mg before bed, Continue rosuvastatin 40 mg daily and on vascepa 1 gm 2 capsules twice daily.  Continue to work on eating a healthy diet and exercise.  Labs drawn today  Orders: -     Lipid panel  Chronic pain syndrome Assessment & Plan: Secondary to chronic back pain.  Continue tramadol.      No orders of the defined types were placed in this encounter.   Orders Placed This Encounter  Procedures   Comprehensive metabolic panel   Hemoglobin A1c   Microalbumin / creatinine urine ratio   Lipid panel     Follow-up: Return in about 3 months (around 05/07/2024) for chronic follow up.   I,Marla I Leal-Borjas,acting as a scribe for Blane Ohara, MD.,have documented all  relevant documentation on the behalf of Blane Ohara, MD,as directed by  Blane Ohara, MD while in the presence of Blane Ohara, MD.   An After Visit Summary was printed and given to the patient.  I attest that I have reviewed this visit and agree with the plan scribed by my staff.   Blane Ohara, MD [facility] [phone]

## 2024-02-05 ENCOUNTER — Encounter: Payer: Self-pay | Admitting: Family Medicine

## 2024-02-05 ENCOUNTER — Ambulatory Visit: Payer: Medicare Other | Admitting: Family Medicine

## 2024-02-05 VITALS — BP 122/68 | HR 78 | Temp 98.2°F | Ht 65.0 in | Wt 140.0 lb

## 2024-02-05 DIAGNOSIS — E890 Postprocedural hypothyroidism: Secondary | ICD-10-CM

## 2024-02-05 DIAGNOSIS — E782 Mixed hyperlipidemia: Secondary | ICD-10-CM | POA: Diagnosis not present

## 2024-02-05 DIAGNOSIS — G894 Chronic pain syndrome: Secondary | ICD-10-CM

## 2024-02-05 DIAGNOSIS — I480 Paroxysmal atrial fibrillation: Secondary | ICD-10-CM

## 2024-02-05 DIAGNOSIS — K2270 Barrett's esophagus without dysplasia: Secondary | ICD-10-CM

## 2024-02-05 DIAGNOSIS — I129 Hypertensive chronic kidney disease with stage 1 through stage 4 chronic kidney disease, or unspecified chronic kidney disease: Secondary | ICD-10-CM

## 2024-02-05 DIAGNOSIS — N184 Chronic kidney disease, stage 4 (severe): Secondary | ICD-10-CM | POA: Diagnosis not present

## 2024-02-05 DIAGNOSIS — E1121 Type 2 diabetes mellitus with diabetic nephropathy: Secondary | ICD-10-CM | POA: Diagnosis not present

## 2024-02-05 NOTE — Assessment & Plan Note (Signed)
 Not at goal.  Recommend start on zetia 10 mg before bed, Continue rosuvastatin 40 mg daily and on vascepa 1 gm 2 capsules twice daily.  Continue to work on eating a healthy diet and exercise.  Labs drawn today

## 2024-02-05 NOTE — Assessment & Plan Note (Signed)
The current medical regimen is effective;  continue present plan and medications.  

## 2024-02-05 NOTE — Assessment & Plan Note (Signed)
Secondary to chronic back pain.  Continue tramadol. 

## 2024-02-05 NOTE — Assessment & Plan Note (Signed)
 Well controlled.  The current medical regimen is effective;  continue present plan and medications. On  Lisinopril 20 mg daily, metoprolol succinate 200 mg once daily, chlorthalidone 25 mg daily, amlodipine 10 mg daily, kerendia 10 mg daily, aspirin 81 mg daily.

## 2024-02-05 NOTE — Assessment & Plan Note (Addendum)
 Control: good No medicines Recommend check feet daily. Recommend annual eye exams. Continue to work on eating a healthy diet and exercise.  Labs drawn today

## 2024-02-05 NOTE — Assessment & Plan Note (Signed)
The current medical regimen is effective;  continue present plan and medications. Pantoprazole 40 mg daily. 

## 2024-02-05 NOTE — Assessment & Plan Note (Signed)
Well controlled Continue Synthroid at current dose

## 2024-02-06 LAB — COMPREHENSIVE METABOLIC PANEL WITH GFR
ALT: 34 IU/L — ABNORMAL HIGH (ref 0–32)
AST: 31 IU/L (ref 0–40)
Albumin: 4.1 g/dL (ref 3.8–4.8)
Alkaline Phosphatase: 134 IU/L — ABNORMAL HIGH (ref 44–121)
BUN/Creatinine Ratio: 15 (ref 12–28)
BUN: 22 mg/dL (ref 8–27)
Bilirubin Total: 0.4 mg/dL (ref 0.0–1.2)
CO2: 22 mmol/L (ref 20–29)
Calcium: 9.2 mg/dL (ref 8.7–10.3)
Chloride: 103 mmol/L (ref 96–106)
Creatinine, Ser: 1.44 mg/dL — ABNORMAL HIGH (ref 0.57–1.00)
Globulin, Total: 2.5 g/dL (ref 1.5–4.5)
Glucose: 95 mg/dL (ref 70–99)
Potassium: 4.6 mmol/L (ref 3.5–5.2)
Sodium: 142 mmol/L (ref 134–144)
Total Protein: 6.6 g/dL (ref 6.0–8.5)
eGFR: 37 mL/min/{1.73_m2} — ABNORMAL LOW (ref >59)

## 2024-02-06 LAB — HEMOGLOBIN A1C
Est. average glucose Bld gHb Est-mCnc: 114 mg/dL
Hgb A1c MFr Bld: 5.6 % (ref 4.8–5.6)

## 2024-02-06 LAB — LIPID PANEL
Chol/HDL Ratio: 2.7 ratio (ref 0.0–4.4)
Cholesterol, Total: 192 mg/dL (ref 100–199)
HDL: 70 mg/dL (ref >39)
LDL Chol Calc (NIH): 100 mg/dL — ABNORMAL HIGH (ref 0–99)
Triglycerides: 128 mg/dL (ref 0–149)
VLDL Cholesterol Cal: 22 mg/dL (ref 5–40)

## 2024-02-06 LAB — MICROALBUMIN / CREATININE URINE RATIO
Creatinine, Urine: 58.3 mg/dL
Microalb/Creat Ratio: 23 mg/g{creat} (ref 0–29)
Microalbumin, Urine: 13.3 ug/mL

## 2024-02-13 ENCOUNTER — Ambulatory Visit

## 2024-02-13 DIAGNOSIS — Z0000 Encounter for general adult medical examination without abnormal findings: Secondary | ICD-10-CM | POA: Diagnosis not present

## 2024-02-13 NOTE — Progress Notes (Signed)
 Subjective:   Pamela Schwartz is a 83 y.o. female who presents for Medicare Annual (Subsequent) preventive examination.  This wellness visit is conducted by a nurse.  The patient's medications were reviewed and reconciled since the patient's last visit.  History details were provided by the patient.  The history appears to be reliable.    Medical History: Patient history and Family history was reviewed  Medications, Allergies, and preventative health maintenance was reviewed and updated.   Visit Complete: Virtual I connected with  [name] on 02/13/24 by a audio enabled telemedicine application and verified that I am speaking with the correct person using two identifiers.  Patient Location: Home  Provider Location: Office/Clinic  I discussed the limitations of evaluation and management by telemedicine. The patient expressed understanding and agreed to proceed.  Vital Signs: Because this visit was a virtual/telehealth visit, some criteria may be missing or patient reported. Any vitals not documented were not able to be obtained and vitals that have been documented are patient reported.   Cardiac Risk Factors include: advanced age (>73men, >43 women);diabetes mellitus;dyslipidemia     Objective:    Today's Vitals   02/13/24 1059  PainSc: 0-No pain  Patient was unable to self-report due to a lack of equipment at home via telehealth  There is no height or weight on file to calculate BMI.     02/01/2021   10:00 AM  Advanced Directives  Does Patient Have a Medical Advance Directive? Yes  Type of Estate agent of [place];Living will  Does patient want to make changes to medical advance directive? No - Patient declined  Copy of Healthcare Power of Attorney in Chart? No - copy requested    Current Medications (verified) Outpatient Encounter Medications as of 02/13/2024  Medication Sig   allopurinol (ZYLOPRIM) 100 MG tablet TAKE ONE TABLET BY MOUTH EVERY  EVENING   amLODipine (NORVASC) 10 MG tablet TAKE ONE TABLET BY MOUTH EVERY EVENING   chlorthalidone (HYGROTON) 25 MG tablet TAKE ONE TABLET BY MOUTH EVERY DAY   ELIQUIS 2.5 MG TABS tablet Take 2.5 mg by mouth 2 (two) times daily.   ezetimibe (ZETIA) 10 MG tablet TAKE ONE TABLET BY MOUTH EVERY DAY   KERENDIA 10 MG TABS TAKE ONE TABLET BY MOUTH EVERY DAY   levothyroxine (SYNTHROID) 112 MCG tablet TAKE ONE TABLET BY MOUTH EVERY MORNING   lisinopril (ZESTRIL) 20 MG tablet TAKE ONE TABLET BY MOUTH EVERY DAY   meclizine (ANTIVERT) 25 MG tablet Take 1 tablet (25 mg total) by mouth 3 (three) times daily as needed for dizziness.   metoprolol succinate (TOPROL-XL) 100 MG 24 hr tablet TAKE 2 TABLETS BY MOUTH EVERY DAY   ondansetron (ZOFRAN-ODT) 4 MG disintegrating tablet Take 1 tablet (4 mg total) by mouth every 8 (eight) hours as needed for nausea or vomiting.   pantoprazole (PROTONIX) 40 MG tablet TAKE ONE TABLET BY MOUTH EVERY EVENING   potassium chloride (KLOR-CON) 10 MEQ tablet TAKE ONE TABLET BY MOUTH EVERY DAY   promethazine (PHENERGAN) 25 MG tablet TAKE ONE TABLET BY MOUTH 4 TIMES DAILY AS NEEDED FOR NAUSEA AND VOMITING   rosuvastatin (CRESTOR) 40 MG tablet TAKE ONE TABLET BY MOUTH EVERY EVENING   traMADol (ULTRAM) 50 MG tablet Take 1 tablet (50 mg total) by mouth every 8 (eight) hours as needed for severe pain (pain score 7-10) (back pain).   VASCEPA 1 g capsule TAKE 2 CAPSULES BY MOUTH TWICE DAILY   zolpidem (AMBIEN CR) 12.5 MG  CR tablet TAKE ONE TABLET BY MOUTH AT BEDTIME   No facility-administered encounter medications on file as of 02/13/2024.    Allergies (verified) Clonidine derivatives, Dexilant [dexlansoprazole], Hydralazine, Nickel, Tape, Tizanidine hcl, Zantac [ranitidine hcl], and Chantix [varenicline]   History: Past Medical History:  Diagnosis Date   Abdominal aortic atherosclerosis (HCC) 03/02/2020   Age-related osteoporosis without current pathological fracture    Arthritis     Barrett's esophagus without dysplasia    Benign positional vertigo, bilateral 03/21/2023   Bilateral carpal tunnel syndrome 07/03/2018   Bilateral hand pain 07/03/2018   Cancer (HCC)    denies   Cervical spinal stenosis 03/06/2016   Chronic idiopathic gout involving toe of right foot without tophus 09/29/2020   Chronic kidney disease    Chronic pain syndrome 03/11/2022   Diabetes mellitus with stage 3 chronic kidney disease (HCC) 07/17/2021   Diabetic glomerulopathy (HCC) 12/16/2022   Exostosis 07/16/2017   Flu vaccine need 07/17/2021   Foot pain, bilateral 03/02/2020   GERD (gastroesophageal reflux disease)    Hearing loss    History of COVID-19    Hypercholesterolemia    Hypertension    Hypertensive chronic kidney disease with stage 1 through stage 4 chronic kidney disease, or unspecified chronic kidney disease    Hypertensive kidney disease with stage 4 chronic kidney disease (HCC) 12/16/2022   IBS (irritable bowel syndrome)    Low back pain    Lumbar pain 03/02/2020   Mixed hyperlipidemia    Paresthesia of skin    Paroxysmal atrial fibrillation (HCC) 04/28/2022   Post-surgical hypothyroidism 12/16/2022   Primary insomnia    Scars    Secondary DM with CKD stage 4 and hypertension (HCC) 12/16/2022   Telogen effluvium 03/02/2020   Vitamin D deficiency, unspecified    Past Surgical History:  Procedure Laterality Date   ANTERIOR CERVICAL DECOMP/DISCECTOMY FUSION N/A 03/06/2016   Procedure: ACDF - C5-C6 - C6-C7  ;  Surgeon: Julio Sicks, MD;  Location: [facility];  Service: Neurosurgery;  Laterality: N/A;  ACDF - C5-C6 - C6-C7     APPENDECTOMY     BUNIONECTOMY     right and left   CARDIAC CATHETERIZATION     had in [place] around 2012   CATARACT EXTRACTION W/ INTRAOCULAR LENS  IMPLANT, BILATERAL     CESAREAN SECTION     COLONOSCOPY W/ BIOPSIES AND POLYPECTOMY     MULTIPLE TOOTH EXTRACTIONS     TUBAL LIGATION     Family History  Problem Relation Age of Onset    Heart attack Mother    Heart attack Father    Hypertension Sister    Hypertension Brother    Renal Disease Brother    Heart attack Brother    Multiple myeloma Brother    Hypertension Sister    Hypertension Sister    Heart attack Sister    Social History   Socioeconomic History   Marital status: Married    Spouse name: Not on file   Number of children: 3   Years of education: Not on file   Highest education level: Not on file  Occupational History   Occupation: Retired Charity fundraiser  Tobacco Use   Smoking status: Former    Current packs/day: 0.00    Types: Cigarettes    Quit date: 10/02/2015    Years since quitting: 8.3   Smokeless tobacco: Never  Vaping Use   Vaping status: Never Used  Substance and Sexual Activity   Alcohol use: No   Drug  use: No   Sexual activity: Not on file  Other Topics Concern   Not on file  Social History Narrative   Not on file   Social Drivers of Health   Financial Resource Strain: Low Risk  (03/21/2023)   Overall Financial Resource Strain (CARDIA)    Difficulty of Paying Living Expenses: Not hard at all  Food Insecurity: No Food Insecurity (02/05/2024)   Hunger Vital Sign    Worried About Running Out of Food in the Last Year: Never true    Ran Out of Food in the Last Year: Never true  Transportation Needs: No Transportation Needs (02/05/2024)   PRAPARE - Administrator, Civil Service (Medical): No    Lack of Transportation (Non-Medical): No  Physical Activity: Insufficiently Active (02/13/2024)   Exercise Vital Sign    Days of Exercise per Week: 4 days    Minutes of Exercise per Session: 30 min  Stress: No Stress Concern Present (02/13/2024)   [employer] of Occupational Health - Occupational Stress Questionnaire    Feeling of Stress : Not at all  Social Connections: Moderately Isolated (02/05/2024)   Social Connection and Isolation Panel [NHANES]    Frequency of Communication with Friends and Family: More than three times a week     Frequency of Social Gatherings with Friends and Family: More than three times a week    Attends Religious Services: Never    Database administrator or Organizations: No    Attends Engineer, structural: Never    Marital Status: Married    Tobacco Counseling Counseling given: Not Answered   Clinical Intake:  Pre-visit preparation completed: Yes  Pain : No/denies pain Pain Score: 0-No pain     BMI - recorded: 23.3 Nutritional Status: BMI of 19-24  Normal Nutritional Risks: None Diabetes: Yes (Most recent A1C 5.6) CBG done?: No  How often do you need to have someone help you when you read instructions, pamphlets, or other written materials from your doctor or pharmacy?: 1 - Never  Interpreter Needed?: No      Activities of Daily Living    02/13/2024   11:07 AM  In your present state of health, do you have any difficulty performing the following activities:  Hearing? 0  Vision? 0  Difficulty concentrating or making decisions? 0  Walking or climbing stairs? 0  Dressing or bathing? 0  Doing errands, shopping? 0  Preparing Food and eating ? N  Using the Toilet? N  In the past six months, have you accidently leaked urine? N  Do you have problems with loss of bowel control? N  Managing your Medications? N  Managing your Finances? N  Housekeeping or managing your Housekeeping? N    Patient Care Team: Blane Ohara, MD as PCP - General (Family Medicine) Tyler Pita, MD as Attending Physician (Internal Medicine) Estill Bamberg, MD as Consulting Physician (Orthopedic Surgery) Claria Dice, MD as Attending Physician (Physical Medicine and Rehabilitation) Park Liter, DPM (Inactive) as Consulting Physician (Podiatry)  Indicate any recent Medical Services you may have received from other than [facility] providers in the past year (date may be approximate).     Assessment:   This is a routine wellness examination for Pamela Schwartz.  Hearing/Vision screen No results  found.   Goals Addressed   None    Depression Screen    02/13/2024   11:05 AM 02/05/2024    8:54 AM 07/18/2023    8:47 AM 03/21/2023  8:37 AM 03/21/2022    3:34 PM 10/17/2021    7:41 AM 07/13/2021    7:41 AM  PHQ 2/9 Scores  PHQ - 2 Score 0 0 0 0 0 0 0    Fall Risk    02/13/2024   11:06 AM 01/03/2024    9:30 AM 07/18/2023    8:47 AM 03/21/2023    8:37 AM 06/05/2022    1:29 PM  Fall Risk   Falls in the past year? 0 1 0 0 0  Number falls in past yr: 0 0 0 0   Injury with Fall? 0 0  0   Risk for fall due to : No Fall Risks No Fall Risks No Fall Risks No Fall Risks   Follow up Falls evaluation completed Falls evaluation completed Falls evaluation completed;Falls prevention discussed Falls evaluation completed     MEDICARE RISK AT HOME: Medicare Risk at Home Any stairs in or around the home?: No If so, are there any without handrails?: No Home free of loose throw rugs in walkways, pet beds, electrical cords, etc?: Yes Adequate lighting in your home to reduce risk of falls?: Yes Life alert?: No Use of a cane, walker or w/c?: No Grab bars in the bathroom?: Yes Shower chair or bench in shower?: Yes Elevated toilet seat or a handicapped toilet?: Yes  TIMED UP AND GO:  Was the test performed?  No    Cognitive Function:        02/13/2024   11:08 AM 03/21/2022    3:43 PM 02/01/2021   11:39 AM  6CIT Screen  What Year? 0 points 0 points 0 points  What month? 0 points 0 points 0 points  What time? 0 points 0 points 0 points  Count back from 20 0 points 0 points 0 points  Months in reverse 0 points 0 points 0 points  Repeat phrase 0 points 0 points 0 points  Total Score 0 points 0 points 0 points    Immunizations Immunization History  Administered Date(s) Administered   Fluad Quad(high Dose 65+) 08/17/2020, 07/13/2021   Fluad Trivalent(High Dose 65+) 07/18/2023   Influenza-Unspecified 07/13/2018, 07/24/2019   Moderna Covid-19 Fall Seasonal Vaccine 81yrs & older 08/27/2022    Moderna SARS-COV2 Booster Vaccination 03/07/2021   Moderna Sars-Covid-2 Vaccination 12/04/2019, 12/28/2019, 09/06/2020   Pfizer(Comirnaty)Fall Seasonal Vaccine 12 years and older 10/23/2023   Pneumococcal Conjugate-13 10/12/2014   Pneumococcal Polysaccharide-23 09/22/2013   Respiratory Syncytial Virus Vaccine,Recomb Aduvanted(Arexvy) 10/10/2022   Tdap 07/14/2015, 07/15/2021   Zoster Recombinant(Shingrix) 01/15/2018, 05/26/2018   Zoster, Live 11/13/2011    TDAP status: Up to date  Flu Vaccine status: Up to date  Pneumococcal vaccine status: Up to date  Covid-19 vaccine status: Completed vaccines  Qualifies for Shingles Vaccine? Yes   Zostavax completed No   Shingrix Completed?: No.    Education has been provided regarding the importance of this vaccine. Patient has been advised to call insurance company to determine out of pocket expense if they have not yet received this vaccine. Advised may also receive vaccine at local pharmacy or Health Dept. Verbalized acceptance and understanding.  Screening Tests Health Maintenance  Topic Date Due   Medicare Annual Wellness (AWV)  03/22/2023   COVID-19 Vaccine (6 - Moderna risk 2024-25 season) 04/22/2024   INFLUENZA VACCINE  06/12/2024   FOOT EXAM  07/17/2024   HEMOGLOBIN A1C  08/07/2024   OPHTHALMOLOGY EXAM  10/13/2024   Diabetic kidney evaluation - eGFR measurement  02/04/2025   Diabetic kidney  evaluation - Urine ACR  02/04/2025   DTaP/Tdap/Td (3 - Td or Tdap) 07/16/2031   Pneumonia Vaccine 102+ Years old  Completed   DEXA SCAN  Completed   Zoster Vaccines- Shingrix  Completed   HPV VACCINES  Aged Out    Health Maintenance  Health Maintenance Due  Topic Date Due   Medicare Annual Wellness (AWV)  03/22/2023    Colorectal cancer screening: No longer required.    Bone Density status: Completed, declined follow-up at this time  Lung Cancer Screening: (Low Dose CT Chest recommended if Age 40-80 years, 20 pack-year currently  smoking OR have quit w/in 15years.) does not qualify.   Lung Cancer Screening Referral: N/A  Additional Screening:  Vision Screening: Recommended annual ophthalmology exams for early detection of glaucoma and other disorders of the eye. Is the patient up to date with their annual eye exam?  Yes   Dental Screening: Recommended annual dental exams for proper oral hygiene   Community Resource Referral / Chronic Care Management: CRR required this visit?  No   CCM required this visit?  No     Plan:     I have personally reviewed and noted the following in the patient's chart:   Medical and social history Use of alcohol, tobacco or illicit drugs  Current medications and supplements including opioid prescriptions.  Functional ability and status Nutritional status Physical activity Advanced directives List of other physicians Hospitalizations, surgeries, and ER visits in previous 12 months Vitals Screenings to include cognitive, depression, and falls Referrals and appointments  In addition, I have reviewed and discussed with patient certain preventive protocols, quality metrics, and best practice recommendations. A written personalized care plan for preventive services as well as general preventive health recommendations were provided to patient.     Jacklynn Bue, LPN   07/17/6212   After Visit Summary: (Mail) Due to this being a telephonic visit, the after visit summary with patients personalized plan was offered to patient via mail

## 2024-02-13 NOTE — Patient Instructions (Signed)
 Pamela Schwartz , Thank you for taking time to come for your Medicare Wellness Visit. I appreciate your ongoing commitment to your health goals. Please review the following plan we discussed and let me know if I can assist you in the future.    This is a list of the screening recommended for you and due dates:  Health Maintenance  Topic Date Due   COVID-19 Vaccine (6 - Moderna risk 2024-25 season) 04/22/2024   Flu Shot  06/12/2024   Complete foot exam   07/17/2024   Hemoglobin A1C  08/07/2024   Eye exam for diabetics  10/13/2024   Yearly kidney function blood test for diabetes  02/04/2025   Yearly kidney health urinalysis for diabetes  02/04/2025   Medicare Annual Wellness Visit  02/12/2025   DTaP/Tdap/Td vaccine (3 - Td or Tdap) 07/16/2031   Pneumonia Vaccine  Completed   DEXA scan (bone density measurement)  Completed   Zoster (Shingles) Vaccine  Completed   HPV Vaccine  Aged Out     Preventive Care 60 Years and Older, Female Preventive care refers to lifestyle choices and visits with your health care provider that can promote health and wellness. What does preventive care include? A yearly physical exam. This is also called an annual well check. Dental exams once or twice a year. Routine eye exams. Ask your health care provider how often you should have your eyes checked. Personal lifestyle choices, including: Daily care of your teeth and gums. Regular physical activity. Eating a healthy diet. Avoiding tobacco and drug use. Limiting alcohol use. Practicing safe sex. Taking low-dose aspirin every day. Taking vitamin and mineral supplements as recommended by your health care provider. What happens during an annual well check? The services and screenings done by your health care provider during your annual well check will depend on your age, overall health, lifestyle risk factors, and family history of disease. Counseling  Your health care provider may ask you questions about  your: Alcohol use. Tobacco use. Drug use. Emotional well-being. Home and relationship well-being. Sexual activity. Eating habits. History of falls. Memory and ability to understand (cognition). Work and work Astronomer. Reproductive health. Screening  You may have the following tests or measurements: Height, weight, and BMI. Blood pressure. Lipid and cholesterol levels. These may be checked every 5 years, or more frequently if you are over 26 years old. Skin check. Lung cancer screening. You may have this screening every year starting at age 30 if you have a 30-pack-year history of smoking and currently smoke or have quit within the past 15 years. Fecal occult blood test (FOBT) of the stool. You may have this test every year starting at age 71. Flexible sigmoidoscopy or colonoscopy. You may have a sigmoidoscopy every 5 years or a colonoscopy every 10 years starting at age 57. Hepatitis C blood test. Hepatitis B blood test. Sexually transmitted disease (STD) testing. Diabetes screening. This is done by checking your blood sugar (glucose) after you have not eaten for a while (fasting). You may have this done every 1-3 years. Bone density scan. This is done to screen for osteoporosis. You may have this done starting at age 18. Mammogram. This may be done every 1-2 years. Talk to your health care provider about how often you should have regular mammograms. Talk with your health care provider about your test results, treatment options, and if necessary, the need for more tests. Vaccines  Your health care provider may recommend certain vaccines, such as: Influenza vaccine. This  is recommended every year. Tetanus, diphtheria, and acellular pertussis (Tdap, Td) vaccine. You may need a Td booster every 10 years. Zoster vaccine. You may need this after age 28. Pneumococcal 13-valent conjugate (PCV13) vaccine. One dose is recommended after age 59. Pneumococcal polysaccharide (PPSV23) vaccine.  One dose is recommended after age 75. Talk to your health care provider about which screenings and vaccines you need and how often you need them. This information is not intended to replace advice given to you by your health care provider. Make sure you discuss any questions you have with your health care provider. Document Released: 11/25/2015 Document Revised: 07/18/2016 Document Reviewed: 08/30/2015 Elsevier Interactive Patient Education  2017 [employer].  Fall Prevention in the Home Falls can cause injuries. They can happen to people of all ages. There are many things you can do to make your home safe and to help prevent falls. What can I do on the outside of my home? Regularly fix the edges of walkways and driveways and fix any cracks. Remove anything that might make you trip as you walk through a door, such as a raised step or threshold. Trim any bushes or trees on the path to your home. Use bright outdoor lighting. Clear any walking paths of anything that might make someone trip, such as rocks or tools. Regularly check to see if handrails are loose or broken. Make sure that both sides of any steps have handrails. Any raised decks and porches should have guardrails on the edges. Have any leaves, snow, or ice cleared regularly. Use sand or salt on walking paths during winter. Clean up any spills in your garage right away. This includes oil or grease spills. What can I do in the bathroom? Use night lights. Install grab bars by the toilet and in the tub and shower. Do not use towel bars as grab bars. Use non-skid mats or decals in the tub or shower. If you need to sit down in the shower, use a plastic, non-slip stool. Keep the floor dry. Clean up any water that spills on the floor as soon as it happens. Remove soap buildup in the tub or shower regularly. Attach bath mats securely with double-sided non-slip rug tape. Do not have throw rugs and other things on the floor that can make  you trip. What can I do in the bedroom? Use night lights. Make sure that you have a light by your bed that is easy to reach. Do not use any sheets or blankets that are too big for your bed. They should not hang down onto the floor. Have a firm chair that has side arms. You can use this for support while you get dressed. Do not have throw rugs and other things on the floor that can make you trip. What can I do in the kitchen? Clean up any spills right away. Avoid walking on wet floors. Keep items that you use a lot in easy-to-reach places. If you need to reach something above you, use a strong step stool that has a grab bar. Keep electrical cords out of the way. Do not use floor polish or wax that makes floors slippery. If you must use wax, use non-skid floor wax. Do not have throw rugs and other things on the floor that can make you trip. What can I do with my stairs? Do not leave any items on the stairs. Make sure that there are handrails on both sides of the stairs and use them. Fix handrails that  are broken or loose. Make sure that handrails are as long as the stairways. Check any carpeting to make sure that it is firmly attached to the stairs. Fix any carpet that is loose or worn. Avoid having throw rugs at the top or bottom of the stairs. If you do have throw rugs, attach them to the floor with carpet tape. Make sure that you have a light switch at the top of the stairs and the bottom of the stairs. If you do not have them, ask someone to add them for you. What else can I do to help prevent falls? Wear shoes that: Do not have high heels. Have rubber bottoms. Are comfortable and fit you well. Are closed at the toe. Do not wear sandals. If you use a stepladder: Make sure that it is fully opened. Do not climb a closed stepladder. Make sure that both sides of the stepladder are locked into place. Ask someone to hold it for you, if possible. Clearly mark and make sure that you can  see: Any grab bars or handrails. First and last steps. Where the edge of each step is. Use tools that help you move around (mobility aids) if they are needed. These include: Canes. Walkers. Scooters. Crutches. Turn on the lights when you go into a dark area. Replace any light bulbs as soon as they burn out. Set up your furniture so you have a clear path. Avoid moving your furniture around. If any of your floors are uneven, fix them. If there are any pets around you, be aware of where they are. Review your medicines with your doctor. Some medicines can make you feel dizzy. This can increase your chance of falling. Ask your doctor what other things that you can do to help prevent falls. This information is not intended to replace advice given to you by your health care provider. Make sure you discuss any questions you have with your health care provider. Document Released: 08/25/2009 Document Revised: 04/05/2016 Document Reviewed: 12/03/2014 Elsevier Interactive Patient Education  2017 [employer].

## 2024-02-23 ENCOUNTER — Other Ambulatory Visit: Payer: Self-pay | Admitting: Family Medicine

## 2024-02-25 ENCOUNTER — Other Ambulatory Visit: Payer: Self-pay | Admitting: Family Medicine

## 2024-02-25 DIAGNOSIS — K219 Gastro-esophageal reflux disease without esophagitis: Secondary | ICD-10-CM

## 2024-03-16 ENCOUNTER — Other Ambulatory Visit: Payer: Self-pay

## 2024-03-16 MED ORDER — PROMETHAZINE HCL 25 MG PO TABS: ORAL_TABLET | ORAL | 3 refills | Status: DC

## 2024-03-18 ENCOUNTER — Telehealth: Payer: Self-pay

## 2024-03-18 NOTE — Telephone Encounter (Signed)
 Please call this patient to schedule the next AWV appointment with Burdette Carolin, LPN Last date of AWV: 02/13/2024

## 2024-03-18 NOTE — Telephone Encounter (Signed)
 Contacted Pamela Schwartz to schedule their annual wellness visit. Appointment made for 02/17/25.

## 2024-03-24 ENCOUNTER — Other Ambulatory Visit: Payer: Self-pay | Admitting: Family Medicine

## 2024-04-06 ENCOUNTER — Other Ambulatory Visit: Payer: Self-pay | Admitting: Family Medicine

## 2024-04-15 DIAGNOSIS — H16143 Punctate keratitis, bilateral: Secondary | ICD-10-CM | POA: Diagnosis not present

## 2024-04-15 DIAGNOSIS — H353132 Nonexudative age-related macular degeneration, bilateral, intermediate dry stage: Secondary | ICD-10-CM | POA: Diagnosis not present

## 2024-05-06 ENCOUNTER — Other Ambulatory Visit: Payer: Self-pay | Admitting: Family Medicine

## 2024-05-06 DIAGNOSIS — M1A0710 Idiopathic chronic gout, right ankle and foot, without tophus (tophi): Secondary | ICD-10-CM

## 2024-05-12 ENCOUNTER — Ambulatory Visit: Admitting: Family Medicine

## 2024-05-12 ENCOUNTER — Encounter: Payer: Self-pay | Admitting: Family Medicine

## 2024-05-12 VITALS — BP 122/68 | HR 78 | Temp 97.8°F | Ht 65.0 in | Wt 144.0 lb

## 2024-05-12 DIAGNOSIS — E1121 Type 2 diabetes mellitus with diabetic nephropathy: Secondary | ICD-10-CM | POA: Diagnosis not present

## 2024-05-12 DIAGNOSIS — N184 Chronic kidney disease, stage 4 (severe): Secondary | ICD-10-CM | POA: Diagnosis not present

## 2024-05-12 DIAGNOSIS — M810 Age-related osteoporosis without current pathological fracture: Secondary | ICD-10-CM | POA: Diagnosis not present

## 2024-05-12 DIAGNOSIS — G894 Chronic pain syndrome: Secondary | ICD-10-CM

## 2024-05-12 DIAGNOSIS — M542 Cervicalgia: Secondary | ICD-10-CM | POA: Diagnosis not present

## 2024-05-12 DIAGNOSIS — I129 Hypertensive chronic kidney disease with stage 1 through stage 4 chronic kidney disease, or unspecified chronic kidney disease: Secondary | ICD-10-CM | POA: Diagnosis not present

## 2024-05-12 DIAGNOSIS — K2270 Barrett's esophagus without dysplasia: Secondary | ICD-10-CM | POA: Diagnosis not present

## 2024-05-12 DIAGNOSIS — E782 Mixed hyperlipidemia: Secondary | ICD-10-CM

## 2024-05-12 NOTE — Progress Notes (Unsigned)
 Subjective:  Patient ID: Pamela Schwartz, female    DOB: 04-12-1941  Age: 83 y.o. MRN: [id number]  Chief Complaint  Patient presents with   Medical Management of Chronic Issues    HPI:  History of Present Illness   Pamela Schwartz is an 83 year old female with atrial fibrillation and hypertension who presents with neck pain.  Neck and back pain - Neck pain localized to the right side and posterior aspect for the past 3-4 months - No radiation of pain down the arm - No associated numbness or burning sensations - No recollection of specific injury; onset possibly after sleeping in an awkward position - Tramadol  at a reduced dose provides relief for both neck and back pain - Tylenol  used as needed for pain management  Sleep disturbance - Ambien  taken for sleep - No recent unusual episodes associated with Ambien  use - Ambien  taken at night with a glass of water before going to bed  Neuropsychiatric symptoms - No memory impairment - No depression, sadness, or anxiety  Atrial fibrillation and anticoagulation - Atrial fibrillation managed with Toprol  - Not currently on a blood thinner  Renal function - Kerendia  10 mg taken for kidney function  Diabetes:  Complications: CKD and glomerulopathy.  Glucose checking: not checking Most recent A1C: 5.6 Current medications: none.  Last Eye Exam: June 2024 Foot checks:daily   Hyperlipidemia: Current medications: On rosuvastatin  40 mg daily and on vascepa  1 gm 2 capsules twice daily.    Hypertensive CKD: Complications: Last GFR 37 Current medications: lisinopril  20 mg daily, metoprolol  succinate 200 mg once daily, chlorthalidone 25 mg daily, amlodipine  10 mg daily, kerendia  10 mg daily, aspirin  81 mg daily.  Patient has seen nephrology.   Hypothyroidism: on synthroid  112 mcg once daily in am.    Atrial fibrillation related to acute illness: on toprol  xl 100 mg twice daily. No blood thinners recommended.    Diet:  healthy Exercise: active   Back Pain: on tramadol  50 mg twice daily. Decreased by nephrology. Recommended to take tylenol  more if needed. Doing well.       02/13/2024   11:05 AM 02/05/2024    8:54 AM 07/18/2023    8:47 AM 03/21/2023    8:37 AM 03/21/2022    3:34 PM  Depression screen PHQ 2/9  Decreased Interest 0 0 0 0 0  Down, Depressed, Hopeless 0 0 0 0 0  PHQ - 2 Score 0 0 0 0 0        02/13/2024   11:06 AM  Fall Risk   Falls in the past year? 0  Number falls in past yr: 0  Injury with Fall? 0  Risk for fall due to : No Fall Risks  Follow up Falls evaluation completed    Patient Care Team: Sherre Clapper, MD as PCP - General (Family Medicine) Norine Manuelita LABOR, MD as Attending Physician (Internal Medicine) Beuford Anes, MD as Consulting Physician (Orthopedic Surgery) Cesario Boer, MD as Attending Physician (Physical Medicine and Rehabilitation) Gretel Ozell PARAS, DPM (Inactive) as Consulting Physician (Podiatry)   Review of Systems  Constitutional:  Negative for chills, fatigue and fever.  HENT:  Negative for congestion, ear pain, rhinorrhea and sore throat.   Respiratory:  Negative for cough and shortness of breath.   Cardiovascular:  Negative for chest pain.  Gastrointestinal:  Negative for abdominal pain, constipation, diarrhea, nausea and vomiting.  Genitourinary:  Negative for dysuria and urgency.  Musculoskeletal:  Negative for back pain and myalgias.  Neurological:  Negative for dizziness, weakness, light-headedness and headaches.  Psychiatric/Behavioral:  Negative for dysphoric mood. The patient is not nervous/anxious.     Current Outpatient Medications on File Prior to Visit  Medication Sig Dispense Refill   allopurinol  (ZYLOPRIM ) 100 MG tablet TAKE ONE TABLET BY MOUTH EVERY EVENING 90 tablet 1   amLODipine  (NORVASC ) 10 MG tablet TAKE ONE TABLET BY MOUTH EVERY EVENING 90 tablet 1   chlorthalidone (HYGROTON) 25 MG tablet TAKE ONE TABLET BY MOUTH EVERY DAY 90 tablet 1    KERENDIA  10 MG TABS TAKE ONE TABLET BY MOUTH EVERY DAY 30 tablet 3   levothyroxine  (SYNTHROID ) 112 MCG tablet TAKE ONE TABLET BY MOUTH EVERY MORNING 90 tablet 1   meclizine  (ANTIVERT ) 25 MG tablet Take 1 tablet (25 mg total) by mouth 3 (three) times daily as needed for dizziness. 30 tablet 2   ondansetron  (ZOFRAN -ODT) 4 MG disintegrating tablet Take 1 tablet (4 mg total) by mouth every 8 (eight) hours as needed for nausea or vomiting. 20 tablet 0   pantoprazole  (PROTONIX ) 40 MG tablet TAKE ONE TABLET BY MOUTH EVERY EVENING 90 tablet 3   promethazine  (PHENERGAN ) 25 MG tablet TAKE ONE TABLET BY MOUTH 4 TIMES DAILY AS NEEDED FOR NAUSEA AND VOMITING 60 tablet 3   traMADol  (ULTRAM ) 50 MG tablet Take 1 tablet (50 mg total) by mouth every 8 (eight) hours as needed for severe pain (pain score 7-10) (back pain). 90 tablet 3   zolpidem  (AMBIEN  CR) 12.5 MG CR tablet TAKE ONE TABLET BY MOUTH AT BEDTIME 30 tablet 5   No current facility-administered medications on file prior to visit.   Past Medical History:  Diagnosis Date   Abdominal aortic atherosclerosis (HCC) 03/02/2020   Age-related osteoporosis without current pathological fracture    Arthritis    Barrett's esophagus without dysplasia    Benign positional vertigo, bilateral 03/21/2023   Bilateral carpal tunnel syndrome 07/03/2018   Bilateral hand pain 07/03/2018   Cancer (HCC)    denies   Cervical spinal stenosis 03/06/2016   Chronic idiopathic gout involving toe of right foot without tophus 09/29/2020   Chronic kidney disease    Chronic pain syndrome 03/11/2022   Diabetes mellitus with stage 3 chronic kidney disease (HCC) 07/17/2021   Diabetic glomerulopathy (HCC) 12/16/2022   Exostosis 07/16/2017   Flu vaccine need 07/17/2021   Foot pain, bilateral 03/02/2020   GERD (gastroesophageal reflux disease)    Hearing loss    History of COVID-19    Hypercholesterolemia    Hypertension    Hypertensive chronic kidney disease with stage 1  through stage 4 chronic kidney disease, or unspecified chronic kidney disease    Hypertensive kidney disease with stage 4 chronic kidney disease (HCC) 12/16/2022   IBS (irritable bowel syndrome)    Low back pain    Lumbar pain 03/02/2020   Mixed hyperlipidemia    Paresthesia of skin    Paroxysmal atrial fibrillation (HCC) 04/28/2022   Post-surgical hypothyroidism 12/16/2022   Primary insomnia    Scars    Secondary DM with CKD stage 4 and hypertension (HCC) 12/16/2022   Telogen effluvium 03/02/2020   Vitamin D  deficiency, unspecified    Past Surgical History:  Procedure Laterality Date   ANTERIOR CERVICAL DECOMP/DISCECTOMY FUSION N/A 03/06/2016   Procedure: ACDF - C5-C6 - C6-C7  ;  Surgeon: Victory Gunnels, MD;  Location: [facility];  Service: Neurosurgery;  Laterality: N/A;  ACDF - C5-C6 - C6-C7     APPENDECTOMY  BUNIONECTOMY     right and left   CARDIAC CATHETERIZATION     had in [place]  around 2012   CATARACT EXTRACTION W/ INTRAOCULAR LENS  IMPLANT, BILATERAL     CESAREAN SECTION     COLONOSCOPY W/ BIOPSIES AND POLYPECTOMY     MULTIPLE TOOTH EXTRACTIONS     TUBAL LIGATION      Family History  Problem Relation Age of Onset   Heart attack Mother    Heart attack Father    Hypertension Sister    Hypertension Brother    Renal Disease Brother    Heart attack Brother    Multiple myeloma Brother    Hypertension Sister    Hypertension Sister    Heart attack Sister    Social History   Socioeconomic History   Marital status: Married    Spouse name: Not on file   Number of children: 3   Years of education: Not on file   Highest education level: Not on file  Occupational History   Occupation: Retired Charity fundraiser  Tobacco Use   Smoking status: Former    Current packs/day: 0.00    Types: Cigarettes    Quit date: 10/02/2015    Years since quitting: 8.6   Smokeless tobacco: Never  Vaping Use   Vaping status: Never Used  Substance and Sexual Activity   Alcohol use: No   Drug  use: No   Sexual activity: Not on file  Other Topics Concern   Not on file  Social History Narrative   Not on file   Social Drivers of Health   Financial Resource Strain: Low Risk  (03/21/2023)   Overall Financial Resource Strain (CARDIA)    Difficulty of Paying Living Expenses: Not hard at all  Food Insecurity: No Food Insecurity (02/05/2024)   Hunger Vital Sign    Worried About Running Out of Food in the Last Year: Never true    Ran Out of Food in the Last Year: Never true  Transportation Needs: No Transportation Needs (02/05/2024)   PRAPARE - Administrator, Civil Service (Medical): No    Lack of Transportation (Non-Medical): No  Physical Activity: Insufficiently Active (02/13/2024)   Exercise Vital Sign    Days of Exercise per Week: 4 days    Minutes of Exercise per Session: 30 min  Stress: No Stress Concern Present (02/13/2024)   [employer] of Occupational Health - Occupational Stress Questionnaire    Feeling of Stress : Not at all  Social Connections: Moderately Isolated (02/05/2024)   Social Connection and Isolation Panel    Frequency of Communication with Friends and Family: More than three times a week    Frequency of Social Gatherings with Friends and Family: More than three times a week    Attends Religious Services: Never    Database administrator or Organizations: No    Attends Engineer, structural: Not on file    Marital Status: Married    Objective:  BP 122/68   Pulse 78   Temp 97.8 F (36.6 C)   Ht 5' 5 (1.651 m)   Wt 144 lb (65.3 kg)   SpO2 98%   BMI 23.96 kg/m      05/12/2024    8:22 AM 02/05/2024    8:49 AM 01/03/2024    9:22 AM  BP/Weight  Systolic BP 122 122 120  Diastolic BP 68 68 76  Wt. (Lbs) 144 140 131  BMI 23.96 kg/m2 23.3 kg/m2 21.8 kg/m2  Physical Exam Vitals reviewed.  Constitutional:      Appearance: Normal appearance. She is normal weight.  Neck:     Vascular: No carotid bruit.  Cardiovascular:      Rate and Rhythm: Normal rate and regular rhythm.     Heart sounds: Normal heart sounds.  Pulmonary:     Effort: Pulmonary effort is normal. No respiratory distress.     Breath sounds: Normal breath sounds.  Abdominal:     General: Abdomen is flat. Bowel sounds are normal.     Palpations: Abdomen is soft.     Tenderness: There is no abdominal tenderness.  Musculoskeletal:        General: Tenderness (rt side of neck. decreased rom to the right) present.  Neurological:     Mental Status: She is alert and oriented to person, place, and time.  Psychiatric:        Mood and Affect: Mood normal.        Behavior: Behavior normal.      Diabetic foot exam was performed with the following findings:   No deformities, ulcerations, or other skin breakdown Normal sensation of 10g monofilament Intact posterior tibialis and dorsalis pedis pulses      Lab Results  Component Value Date   WBC 10.0 05/12/2024   HGB 14.8 05/12/2024   HCT 45.3 05/12/2024   PLT 195 05/12/2024   GLUCOSE 96 05/12/2024   CHOL 219 (H) 05/12/2024   TRIG 178 (H) 05/12/2024   HDL 55 05/12/2024   LDLCALC 132 (H) 05/12/2024   ALT 19 05/12/2024   AST 22 05/12/2024   NA 143 05/12/2024   K 4.2 05/12/2024   CL 103 05/12/2024   CREATININE 1.51 (H) 05/12/2024   BUN 23 05/12/2024   CO2 22 05/12/2024   TSH 0.97 12/10/2023   HGBA1C 5.7 (H) 05/12/2024   MICROALBUR 150 10/17/2021      Assessment & Plan:     Barrett's esophagus without dysplasia Assessment & Plan: The current medical regimen is effective;  continue present plan and medications.  Pantoprazole  40 mg daily.   Diabetic glomerulopathy [facility]) Assessment & Plan: Control: good No medicines Recommend check feet daily. Recommend annual eye exams. Continue to work on eating a healthy diet and exercise.  Labs drawn today    Orders: -     Hemoglobin A1c  Age-related osteoporosis without current pathological fracture  Hypertensive kidney disease  with stage 4 chronic kidney disease (HCC) Assessment & Plan: Well controlled.  The current medical regimen is effective;  continue present plan and medications. On  Lisinopril  20 mg daily, metoprolol  succinate 200 mg once daily, chlorthalidone 25 mg daily, amlodipine  10 mg daily, kerendia  10 mg daily, aspirin  81 mg daily.    Orders: -     CBC with Differential/Platelet -     Comprehensive metabolic panel with GFR  Mixed hyperlipidemia Assessment & Plan: Not at goal.  Recommend start on zetia  10 mg before bed, Continue rosuvastatin  40 mg daily and on vascepa  1 gm 2 capsules twice daily.  Continue to work on eating a healthy diet and exercise.  Labs drawn today  Orders: -     Lipid panel  Chronic pain syndrome Assessment & Plan: Secondary to chronic back pain and neck pain. Continue tramadol .   Neck pain Assessment & Plan: Chronic right-sided posterior neck pain radiating to upper arm. Partial relief with tramadol  and acetaminophen . Considering muscle relaxants cautiously due to zolpidem  use. X-ray indicated due to duration. - Order cervical  spine X-ray. - Recommend home neck exercises. - Advise ice and heat therapy. - Consider muscle relaxants if appropriate.  Orders: -     DG Cervical Spine Complete; Future         No orders of the defined types were placed in this encounter.   Orders Placed This Encounter  Procedures   DG Cervical Spine Complete   CBC with Differential/Platelet   Comprehensive metabolic panel with GFR   Hemoglobin A1c   Lipid panel     Follow-up: No follow-ups on file.   I,Marla I Leal-Borjas,acting as a scribe for Abigail Free, MD.,have documented all relevant documentation on the behalf of Abigail Free, MD,as directed by  Abigail Free, MD while in the presence of Abigail Free, MD.   An After Visit Summary was printed and given to the patient.  I attest that I have reviewed this visit and agree with the plan scribed by my staff.   Abigail Free, MD [facility] [phone]

## 2024-05-13 ENCOUNTER — Other Ambulatory Visit: Payer: Self-pay | Admitting: Family Medicine

## 2024-05-13 ENCOUNTER — Ambulatory Visit: Payer: Self-pay | Admitting: Family Medicine

## 2024-05-13 ENCOUNTER — Ambulatory Visit (INDEPENDENT_AMBULATORY_CARE_PROVIDER_SITE_OTHER)
Admission: RE | Admit: 2024-05-13 | Discharge: 2024-05-13 | Disposition: A | Discharge status: Home / Self Care | Source: Ambulatory Visit | Attending: Family Medicine | Admitting: Family Medicine

## 2024-05-13 DIAGNOSIS — M542 Cervicalgia: Secondary | ICD-10-CM

## 2024-05-13 DIAGNOSIS — M4312 Spondylolisthesis, cervical region: Secondary | ICD-10-CM | POA: Diagnosis not present

## 2024-05-13 DIAGNOSIS — Z981 Arthrodesis status: Secondary | ICD-10-CM | POA: Diagnosis not present

## 2024-05-13 DIAGNOSIS — M47812 Spondylosis without myelopathy or radiculopathy, cervical region: Secondary | ICD-10-CM | POA: Diagnosis not present

## 2024-05-13 LAB — COMPREHENSIVE METABOLIC PANEL WITH GFR
ALT: 19 IU/L (ref 0–32)
AST: 22 IU/L (ref 0–40)
Albumin: 4.2 g/dL (ref 3.8–4.8)
Alkaline Phosphatase: 129 IU/L — ABNORMAL HIGH (ref 44–121)
BUN/Creatinine Ratio: 15 (ref 12–28)
BUN: 23 mg/dL (ref 8–27)
Bilirubin Total: 0.3 mg/dL (ref 0.0–1.2)
CO2: 22 mmol/L (ref 20–29)
Calcium: 9.5 mg/dL (ref 8.7–10.3)
Chloride: 103 mmol/L (ref 96–106)
Creatinine, Ser: 1.51 mg/dL — ABNORMAL HIGH (ref 0.57–1.00)
Globulin, Total: 2.6 g/dL (ref 1.5–4.5)
Glucose: 96 mg/dL (ref 70–99)
Potassium: 4.2 mmol/L (ref 3.5–5.2)
Sodium: 143 mmol/L (ref 134–144)
Total Protein: 6.8 g/dL (ref 6.0–8.5)
eGFR: 35 mL/min/{1.73_m2} — ABNORMAL LOW (ref >59)

## 2024-05-13 LAB — CBC WITH DIFFERENTIAL/PLATELET
Basophils Absolute: 0.1 10*3/uL (ref 0.0–0.2)
Basos: 1 %
EOS (ABSOLUTE): 0.5 10*3/uL — ABNORMAL HIGH (ref 0.0–0.4)
Eos: 5 %
Hematocrit: 45.3 % (ref 34.0–46.6)
Hemoglobin: 14.8 g/dL (ref 11.1–15.9)
Immature Grans (Abs): 0 10*3/uL (ref 0.0–0.1)
Immature Granulocytes: 0 %
Lymphocytes Absolute: 2.7 10*3/uL (ref 0.7–3.1)
Lymphs: 27 %
MCH: 30.6 pg (ref 26.6–33.0)
MCHC: 32.7 g/dL (ref 31.5–35.7)
MCV: 94 fL (ref 79–97)
Monocytes Absolute: 0.7 10*3/uL (ref 0.1–0.9)
Monocytes: 7 %
Neutrophils Absolute: 6.1 10*3/uL (ref 1.4–7.0)
Neutrophils: 60 %
Platelets: 195 10*3/uL (ref 150–450)
RBC: 4.83 x10E6/uL (ref 3.77–5.28)
RDW: 12.7 % (ref 11.7–15.4)
WBC: 10 10*3/uL (ref 3.4–10.8)

## 2024-05-13 LAB — HEMOGLOBIN A1C
Est. average glucose Bld gHb Est-mCnc: 117 mg/dL
Hgb A1c MFr Bld: 5.7 % — ABNORMAL HIGH (ref 4.8–5.6)

## 2024-05-13 LAB — LIPID PANEL
Chol/HDL Ratio: 4 ratio (ref 0.0–4.4)
Cholesterol, Total: 219 mg/dL — ABNORMAL HIGH (ref 100–199)
HDL: 55 mg/dL (ref >39)
LDL Chol Calc (NIH): 132 mg/dL — ABNORMAL HIGH (ref 0–99)
Triglycerides: 178 mg/dL — ABNORMAL HIGH (ref 0–149)
VLDL Cholesterol Cal: 32 mg/dL (ref 5–40)

## 2024-05-13 MED ORDER — METOPROLOL SUCCINATE ER 100 MG PO TB24: 200.0000 mg | ORAL_TABLET | Freq: Every day | ORAL | 1 refills | Status: DC

## 2024-05-13 MED ORDER — ICOSAPENT ETHYL 1 G PO CAPS: 2.0000 g | ORAL_CAPSULE | Freq: Two times a day (BID) | ORAL | 1 refills | Status: DC

## 2024-05-13 MED ORDER — EZETIMIBE 10 MG PO TABS: 10.0000 mg | ORAL_TABLET | Freq: Every day | ORAL | 1 refills | Status: DC

## 2024-05-13 MED ORDER — POTASSIUM CHLORIDE ER 10 MEQ PO TBCR: 10.0000 meq | EXTENDED_RELEASE_TABLET | Freq: Every day | ORAL | 1 refills | Status: DC

## 2024-05-13 MED ORDER — ELIQUIS 2.5 MG PO TABS: 2.5000 mg | ORAL_TABLET | Freq: Two times a day (BID) | ORAL | 1 refills | Status: DC

## 2024-05-13 MED ORDER — LISINOPRIL 20 MG PO TABS: 20.0000 mg | ORAL_TABLET | Freq: Every day | ORAL | 1 refills | Status: DC

## 2024-05-13 MED ORDER — ROSUVASTATIN CALCIUM 40 MG PO TABS: 40.0000 mg | ORAL_TABLET | Freq: Every evening | ORAL | 1 refills | Status: DC

## 2024-05-14 DIAGNOSIS — M542 Cervicalgia: Secondary | ICD-10-CM | POA: Insufficient documentation

## 2024-05-14 NOTE — Assessment & Plan Note (Signed)
 Chronic right-sided posterior neck pain radiating to upper arm. Partial relief with tramadol  and acetaminophen . Considering muscle relaxants cautiously due to zolpidem  use. X-ray indicated due to duration. - Order cervical spine X-ray. - Recommend home neck exercises. - Advise ice and heat therapy. - Consider muscle relaxants if appropriate.

## 2024-05-14 NOTE — Assessment & Plan Note (Signed)
 Well controlled.  The current medical regimen is effective;  continue present plan and medications. On  Lisinopril 20 mg daily, metoprolol succinate 200 mg once daily, chlorthalidone 25 mg daily, amlodipine 10 mg daily, kerendia 10 mg daily, aspirin 81 mg daily.

## 2024-05-14 NOTE — Assessment & Plan Note (Signed)
 Secondary to chronic back pain and neck pain. Continue tramadol .

## 2024-05-14 NOTE — Assessment & Plan Note (Signed)
 Not at goal.  Recommend start on zetia 10 mg before bed, Continue rosuvastatin 40 mg daily and on vascepa 1 gm 2 capsules twice daily.  Continue to work on eating a healthy diet and exercise.  Labs drawn today

## 2024-05-14 NOTE — Assessment & Plan Note (Signed)
The current medical regimen is effective;  continue present plan and medications. Pantoprazole 40 mg daily. 

## 2024-05-14 NOTE — Assessment & Plan Note (Signed)
 Control: good No medicines Recommend check feet daily. Recommend annual eye exams. Continue to work on eating a healthy diet and exercise.  Labs drawn today

## 2024-06-18 ENCOUNTER — Other Ambulatory Visit: Payer: Self-pay | Admitting: Family Medicine

## 2024-06-29 ENCOUNTER — Other Ambulatory Visit: Payer: Self-pay | Admitting: Family Medicine

## 2024-06-30 ENCOUNTER — Ambulatory Visit (INDEPENDENT_AMBULATORY_CARE_PROVIDER_SITE_OTHER): Admitting: Physician Assistant

## 2024-06-30 ENCOUNTER — Encounter: Payer: Self-pay | Admitting: Physician Assistant

## 2024-06-30 VITALS — BP 122/80 | HR 79 | Temp 97.4°F | Ht 65.0 in | Wt 144.0 lb

## 2024-06-30 DIAGNOSIS — I10 Essential (primary) hypertension: Secondary | ICD-10-CM | POA: Diagnosis not present

## 2024-06-30 DIAGNOSIS — M1990 Unspecified osteoarthritis, unspecified site: Secondary | ICD-10-CM | POA: Diagnosis not present

## 2024-06-30 DIAGNOSIS — E860 Dehydration: Secondary | ICD-10-CM | POA: Diagnosis not present

## 2024-06-30 DIAGNOSIS — R110 Nausea: Secondary | ICD-10-CM | POA: Diagnosis not present

## 2024-06-30 DIAGNOSIS — I129 Hypertensive chronic kidney disease with stage 1 through stage 4 chronic kidney disease, or unspecified chronic kidney disease: Secondary | ICD-10-CM | POA: Diagnosis not present

## 2024-06-30 DIAGNOSIS — R9431 Abnormal electrocardiogram [ECG] [EKG]: Secondary | ICD-10-CM | POA: Diagnosis not present

## 2024-06-30 DIAGNOSIS — N189 Chronic kidney disease, unspecified: Secondary | ICD-10-CM | POA: Diagnosis not present

## 2024-06-30 DIAGNOSIS — R112 Nausea with vomiting, unspecified: Secondary | ICD-10-CM | POA: Diagnosis not present

## 2024-06-30 DIAGNOSIS — E7800 Pure hypercholesterolemia, unspecified: Secondary | ICD-10-CM | POA: Diagnosis not present

## 2024-06-30 DIAGNOSIS — R109 Unspecified abdominal pain: Secondary | ICD-10-CM | POA: Diagnosis not present

## 2024-06-30 MED ORDER — PROMETHAZINE HCL 25 MG/ML IJ SOLN
25.0000 mg | Freq: Once | INTRAMUSCULAR | Status: AC
  Administered 2024-06-30: 25 mg via INTRAMUSCULAR

## 2024-06-30 NOTE — Progress Notes (Signed)
 Acute Office Visit  Subjective:    Patient ID: Pamela Schwartz, female    DOB: 01/27/1941, 83 y.o.   MRN: [id number]  Chief Complaint  Patient presents with   Nausea and vomiting    HPI: Patient is in today for severe nausea  Discussed the use of AI scribe software for clinical note transcription with the patient, who gave verbal consent to proceed.  History of Present Illness   Pamela Schwartz is an 83 year old female who presents with severe nausea and dehydration.  She has experienced severe nausea for the past three to five days, unrelieved by home remedies or medications. She has not attempted treatments such as Pepto-Bismol, ginger ale, or heat packs. The nausea is persistent, and she is unable to retain any food or water, leading to dehydration.  No fever, abdominal pain, or pain radiating to other areas. She denies recent changes in her medication regimen and has not started any new medications. She has a history of a cough persisting for four weeks, which she does not believe is related to her current symptoms.  Her last bowel movement was yesterday, with no blood or mucus in her stool. She has not urinated today, with her last urination occurring yesterday, and denies any abnormal color or pain associated with urination.  She experiences lightheadedness when moving from sitting to standing, and her family member notes her inability to eat or drink for several days, contributing to severe dehydration.       Past Medical History:  Diagnosis Date   Abdominal aortic atherosclerosis (HCC) 03/02/2020   Age-related osteoporosis without current pathological fracture    Arthritis    Barrett's esophagus without dysplasia    Benign positional vertigo, bilateral 03/21/2023   Bilateral carpal tunnel syndrome 07/03/2018   Bilateral hand pain 07/03/2018   Cancer (HCC)    denies   Cervical spinal stenosis 03/06/2016   Chronic idiopathic gout involving toe of right foot without  tophus 09/29/2020   Chronic kidney disease    Chronic pain syndrome 03/11/2022   Diabetes mellitus with stage 3 chronic kidney disease (HCC) 07/17/2021   Diabetic glomerulopathy (HCC) 12/16/2022   Exostosis 07/16/2017   Flu vaccine need 07/17/2021   Foot pain, bilateral 03/02/2020   GERD (gastroesophageal reflux disease)    Hearing loss    History of COVID-19    Hypercholesterolemia    Hypertension    Hypertensive chronic kidney disease with stage 1 through stage 4 chronic kidney disease, or unspecified chronic kidney disease    Hypertensive kidney disease with stage 4 chronic kidney disease (HCC) 12/16/2022   IBS (irritable bowel syndrome)    Low back pain    Lumbar pain 03/02/2020   Mixed hyperlipidemia    Paresthesia of skin    Paroxysmal atrial fibrillation (HCC) 04/28/2022   Post-surgical hypothyroidism 12/16/2022   Primary insomnia    Scars    Secondary DM with CKD stage 4 and hypertension (HCC) 12/16/2022   Telogen effluvium 03/02/2020   Vitamin D  deficiency, unspecified     Past Surgical History:  Procedure Laterality Date   ANTERIOR CERVICAL DECOMP/DISCECTOMY FUSION N/A 03/06/2016   Procedure: ACDF - C5-C6 - C6-C7  ;  Surgeon: Victory Gunnels, MD;  Location: [facility];  Service: Neurosurgery;  Laterality: N/A;  ACDF - C5-C6 - C6-C7     APPENDECTOMY     BUNIONECTOMY     right and left   CARDIAC CATHETERIZATION     had in [place]  around 2012  CATARACT EXTRACTION W/ INTRAOCULAR LENS  IMPLANT, BILATERAL     CESAREAN SECTION     COLONOSCOPY W/ BIOPSIES AND POLYPECTOMY     MULTIPLE TOOTH EXTRACTIONS     TUBAL LIGATION      Family History  Problem Relation Age of Onset   Heart attack Mother    Heart attack Father    Hypertension Sister    Hypertension Brother    Renal Disease Brother    Heart attack Brother    Multiple myeloma Brother    Hypertension Sister    Hypertension Sister    Heart attack Sister     Social History   Socioeconomic History    Marital status: Married    Spouse name: Not on file   Number of children: 3   Years of education: Not on file   Highest education level: Not on file  Occupational History   Occupation: Retired Charity fundraiser  Tobacco Use   Smoking status: Former    Current packs/day: 0.00    Types: Cigarettes    Quit date: 10/02/2015    Years since quitting: 8.7   Smokeless tobacco: Never  Vaping Use   Vaping status: Never Used  Substance and Sexual Activity   Alcohol use: No   Drug use: No   Sexual activity: Not on file  Other Topics Concern   Not on file  Social History Narrative   Not on file   Social Drivers of Health   Financial Resource Strain: Low Risk  (03/21/2023)   Overall Financial Resource Strain (CARDIA)    Difficulty of Paying Living Expenses: Not hard at all  Food Insecurity: No Food Insecurity (02/05/2024)   Hunger Vital Sign    Worried About Running Out of Food in the Last Year: Never true    Ran Out of Food in the Last Year: Never true  Transportation Needs: No Transportation Needs (02/05/2024)   PRAPARE - Administrator, Civil Service (Medical): No    Lack of Transportation (Non-Medical): No  Physical Activity: Insufficiently Active (02/13/2024)   Exercise Vital Sign    Days of Exercise per Week: 4 days    Minutes of Exercise per Session: 30 min  Stress: No Stress Concern Present (02/13/2024)   [employer] of Occupational Health - Occupational Stress Questionnaire    Feeling of Stress : Not at all  Social Connections: Moderately Isolated (02/05/2024)   Social Connection and Isolation Panel    Frequency of Communication with Friends and Family: More than three times a week    Frequency of Social Gatherings with Friends and Family: More than three times a week    Attends Religious Services: Never    Database administrator or Organizations: No    Attends Engineer, structural: Not on file    Marital Status: Married  Catering manager Violence: Not At Risk  (02/05/2024)   Humiliation, Afraid, Rape, and Kick questionnaire    Fear of Current or Ex-Partner: No    Emotionally Abused: No    Physically Abused: No    Sexually Abused: No    Outpatient Medications Prior to Visit  Medication Sig Dispense Refill   allopurinol  (ZYLOPRIM ) 100 MG tablet TAKE ONE TABLET BY MOUTH EVERY EVENING 90 tablet 1   amLODipine  (NORVASC ) 10 MG tablet TAKE ONE TABLET BY MOUTH EVERY EVENING 90 tablet 1   chlorthalidone (HYGROTON) 25 MG tablet TAKE ONE TABLET BY MOUTH EVERY DAY 90 tablet 1   ELIQUIS  2.5 MG TABS tablet  Take 1 tablet (2.5 mg total) by mouth 2 (two) times daily. 180 tablet 1   ezetimibe  (ZETIA ) 10 MG tablet Take 1 tablet (10 mg total) by mouth daily. 90 tablet 1   icosapent  Ethyl (VASCEPA ) 1 g capsule Take 2 capsules (2 g total) by mouth 2 (two) times daily. 360 capsule 1   KERENDIA  10 MG TABS TAKE ONE TABLET BY MOUTH EVERY DAY 30 tablet 3   levothyroxine  (SYNTHROID ) 112 MCG tablet TAKE ONE TABLET BY MOUTH EVERY MORNING 90 tablet 1   lisinopril  (ZESTRIL ) 20 MG tablet Take 1 tablet (20 mg total) by mouth daily. 90 tablet 1   meclizine  (ANTIVERT ) 25 MG tablet Take 1 tablet (25 mg total) by mouth 3 (three) times daily as needed for dizziness. 30 tablet 2   metoprolol  succinate (TOPROL -XL) 100 MG 24 hr tablet Take 2 tablets (200 mg total) by mouth daily. 180 tablet 1   ondansetron  (ZOFRAN -ODT) 4 MG disintegrating tablet Take 1 tablet (4 mg total) by mouth every 8 (eight) hours as needed for nausea or vomiting. 20 tablet 0   pantoprazole  (PROTONIX ) 40 MG tablet TAKE ONE TABLET BY MOUTH EVERY EVENING 90 tablet 3   potassium chloride  (KLOR-CON ) 10 MEQ tablet Take 1 tablet (10 mEq total) by mouth daily. 90 tablet 1   promethazine  (PHENERGAN ) 25 MG tablet TAKE ONE TABLET BY MOUTH 4 TIMES DAILY AS NEEDED FOR NAUSEA AND VOMITING 60 tablet 3   rosuvastatin  (CRESTOR ) 40 MG tablet Take 1 tablet (40 mg total) by mouth every evening. 90 tablet 1   traMADol  (ULTRAM ) 50 MG  tablet Take 1 tablet (50 mg total) by mouth every 8 (eight) hours as needed for severe pain (pain score 7-10) (back pain). 90 tablet 3   zolpidem  (AMBIEN  CR) 12.5 MG CR tablet TAKE ONE TABLET BY MOUTH AT BEDTIME 30 tablet 5   No facility-administered medications prior to visit.    Allergies  Allergen Reactions   Clonidine Derivatives Other (See Comments)   Dexilant [Dexlansoprazole] Diarrhea   Hydralazine    Nickel    Tape Other (See Comments)    adhesive    Tizanidine Hcl Other (See Comments)    Somnolence   Zantac [Ranitidine Hcl]    Chantix [Varenicline] Rash    Review of Systems  Constitutional:  Positive for appetite change. Negative for fatigue and fever.  HENT:  Negative for congestion, ear pain, sinus pressure and sore throat.   Respiratory:  Negative for cough, chest tightness, shortness of breath and wheezing.   Cardiovascular:  Negative for chest pain and palpitations.  Gastrointestinal:  Positive for nausea and vomiting. Negative for abdominal pain, constipation and diarrhea.  Genitourinary:  Negative for dysuria and hematuria.  Musculoskeletal:  Negative for arthralgias, back pain, joint swelling and myalgias.  Skin:  Negative for rash.  Neurological:  Negative for dizziness, weakness and headaches.  Psychiatric/Behavioral:  Negative for dysphoric mood. The patient is not nervous/anxious.        Objective:        06/30/2024   11:23 AM 05/12/2024    8:22 AM 02/05/2024    8:49 AM  Vitals with BMI  Height 5' 5 5' 5 5' 5  Weight 144 lbs 144 lbs 140 lbs  BMI 23.96 23.96 23.3  Systolic 122 122 877  Diastolic 80 68 68  Pulse 79 78 78    Orthostatic VS for the past 72 hrs (Last 3 readings):  Patient Position BP Location  06/30/24 1123 Sitting Left Arm  Physical Exam Constitutional:      General: She is in acute distress.     Appearance: She is ill-appearing.  Cardiovascular:     Rate and Rhythm: Normal rate and regular rhythm.  Pulmonary:      Effort: Pulmonary effort is normal.     Breath sounds: Normal breath sounds.  Abdominal:     General: Bowel sounds are normal.     Palpations: Abdomen is soft.     Tenderness: There is abdominal tenderness. There is no right CVA tenderness, left CVA tenderness, guarding or rebound.     Health Maintenance Due  Topic Date Due   COVID-19 Vaccine (6 - Moderna risk 2024-25 season) 04/22/2024   INFLUENZA VACCINE  06/12/2024    There are no preventive care reminders to display for this patient.   Lab Results  Component Value Date   TSH 0.97 12/10/2023   Lab Results  Component Value Date   WBC 10.0 05/12/2024   HGB 14.8 05/12/2024   HCT 45.3 05/12/2024   MCV 94 05/12/2024   PLT 195 05/12/2024   Lab Results  Component Value Date   NA 143 05/12/2024   K 4.2 05/12/2024   CO2 22 05/12/2024   GLUCOSE 96 05/12/2024   BUN 23 05/12/2024   CREATININE 1.51 (H) 05/12/2024   BILITOT 0.3 05/12/2024   ALKPHOS 129 (H) 05/12/2024   AST 22 05/12/2024   ALT 19 05/12/2024   PROT 6.8 05/12/2024   ALBUMIN 4.2 05/12/2024   CALCIUM  9.5 05/12/2024   ANIONGAP 12 03/06/2016   EGFR 35 (L) 05/12/2024   Lab Results  Component Value Date   CHOL 219 (H) 05/12/2024   Lab Results  Component Value Date   HDL 55 05/12/2024   Lab Results  Component Value Date   LDLCALC 132 (H) 05/12/2024   Lab Results  Component Value Date   TRIG 178 (H) 05/12/2024   Lab Results  Component Value Date   CHOLHDL 4.0 05/12/2024   Lab Results  Component Value Date   HGBA1C 5.7 (H) 05/12/2024       Assessment & Plan:  Nausea Assessment & Plan: Persistent nausea for 3-5 days without abdominal pain, fever, or dietary changes. Dehydration likely exacerbating symptoms. Differential includes diverticulitis and gallstones. - Administered Phenergan  injection in office. - Referred to [facility] ER for further evaluation and management. - Instructed to receive IV fluids at ER. - Coordinated with ER for  diagnostic workup.  Orders: -     Promethazine  HCl  Dehydration Assessment & Plan: Severe dehydration due to persistent nausea and inability to retain oral intake. No urination since yesterday, potential kidney function concerns. - Referred to [facility] ER for IV fluids.       Meds ordered this encounter  Medications   promethazine  (PHENERGAN ) injection 25 mg    No orders of the defined types were placed in this encounter.    Follow-up: No follow-ups on file.  An After Visit Summary was printed and given to the patient.    I,Lauren M Auman,acting as a Neurosurgeon for [employer], [employer].,have documented all relevant documentation on the behalf of Pamela Angles, PA,as directed by  Pamela Angles, PA while in the presence of Pamela Schwartz, [place].    Pamela Schwartz, [place] [facility] [phone]

## 2024-06-30 NOTE — Assessment & Plan Note (Signed)
 Severe dehydration due to persistent nausea and inability to retain oral intake. No urination since yesterday, potential kidney function concerns. - Referred to [facility] ER for IV fluids.

## 2024-06-30 NOTE — Assessment & Plan Note (Signed)
 Persistent nausea for 3-5 days without abdominal pain, fever, or dietary changes. Dehydration likely exacerbating symptoms. Differential includes diverticulitis and gallstones. - Administered Phenergan  injection in office. - Referred to [facility] ER for further evaluation and management. - Instructed to receive IV fluids at ER. - Coordinated with ER for diagnostic workup.

## 2024-07-01 ENCOUNTER — Other Ambulatory Visit: Payer: Self-pay | Admitting: Family Medicine

## 2024-07-17 ENCOUNTER — Other Ambulatory Visit: Payer: Self-pay | Admitting: Family Medicine

## 2024-08-12 ENCOUNTER — Other Ambulatory Visit: Payer: Self-pay | Admitting: Family Medicine

## 2024-08-12 ENCOUNTER — Telehealth: Payer: Self-pay

## 2024-08-12 MED ORDER — MAGNESIUM OXIDE 400 MG PO TABS: 400.0000 mg | ORAL_TABLET | Freq: Every day | ORAL | 1 refills | Status: AC

## 2024-08-12 NOTE — Telephone Encounter (Signed)
 Copied from CRM #[id number]. Topic: Clinical - Medication Question >> Aug 12, 2024 11:52 AM Pamela Schwartz wrote: Reason for CRM: The patient was prescribed Magnesium by the ED doctor and she would like to know if she should continue to take that medication//

## 2024-08-13 NOTE — Telephone Encounter (Signed)
 Patient informed and verbalized understanding

## 2024-08-17 ENCOUNTER — Other Ambulatory Visit: Payer: Self-pay | Admitting: Family Medicine

## 2024-08-31 NOTE — Assessment & Plan Note (Addendum)
 LDL cholesterol improved to 122 mg/dL. Triglycerides increased to 199 mg/dL. Managed with Vascepa  and rosuvastatin . Discussed Repatha addition; she agreed to trial. - Provide a sample of Repatha for trial. - Send prescription for Repatha to [facility]. - Check for prior authorization for Repatha. Orders:   POCT Lipid Panel

## 2024-08-31 NOTE — Assessment & Plan Note (Addendum)
 Control: good No medicines Recommend check feet daily. Recommend annual eye exams. Continue to work on eating a healthy diet and exercise.  Labs drawn today.  Managed with Kerendia  10 mg daily. Orders:   POCT glycosylated hemoglobin (Hb A1C)

## 2024-08-31 NOTE — Assessment & Plan Note (Addendum)
 Well-controlled with lisinopril , Toprol  XL, amlodipine , chlorthalidone, and potassium. Blood pressure within target range. CKD treated with Kerendia  10 mg daily.  Orders:   Comprehensive metabolic panel with GFR   CBC with Differential/Platelet

## 2024-08-31 NOTE — Progress Notes (Signed)
 Subjective:  Patient ID: Pamela Schwartz, female    DOB: [birth year]  Age: 83 y.o. MRN: [id number]  Chief Complaint  Patient presents with   Medical Management of Chronic Issues    HPI: Discussed the use of AI scribe software for clinical note transcription with the patient, who gave verbal consent to proceed.  History of Present Illness Pamela Schwartz is an 83 year old female who presents for routine follow-up.  Glycemic control - Hemoglobin A1c is 5.3% on recent point of care testing - Not currently monitoring blood glucose at home - No symptoms of hyperglycemia or hypoglycemia  Dyslipidemia - LDL cholesterol is 122 mg/dL, slightly decreased from previous level of 132 mg/dL - Triglycerides increased from 178 mg/dL to 800 mg/dL - HDL and total cholesterol remain stable - Current medications for cholesterol management include Vascepa , rosuvastatin , and Zetia  - Difficulty swallowing Vascepa  capsules  Lower extremity edema - Leg swelling occurs only in the late evening - No associated symptoms of chest pain, shortness of breath, or bowel problems  General symptoms - No fevers, chills, sweats, earaches, sore throat, stuffy nose, chest pain, breathing problems, or bowel problems       02/13/2024   11:05 AM 02/05/2024    8:54 AM 07/18/2023    8:47 AM 03/21/2023    8:37 AM 03/21/2022    3:34 PM  Depression screen PHQ 2/9  Decreased Interest 0 0 0 0 0  Down, Depressed, Hopeless 0 0 0 0 0  PHQ - 2 Score 0 0 0 0 0        02/13/2024   11:06 AM  Fall Risk   Falls in the past year? 0  Number falls in past yr: 0  Injury with Fall? 0  Risk for fall due to : No Fall Risks  Follow up Falls evaluation completed    Patient Care Team: Sherre Clapper, MD as PCP - General (Family Medicine) Norine Manuelita LABOR, MD as Attending Physician (Internal Medicine) Beuford Anes, MD as Consulting Physician (Orthopedic Surgery) Cesario Boer, MD as Attending Physician (Physical Medicine and  Rehabilitation) Gretel Ozell PARAS, DPM (Inactive) as Consulting Physician (Podiatry)   Review of Systems  Current Outpatient Medications on File Prior to Visit  Medication Sig Dispense Refill   allopurinol  (ZYLOPRIM ) 100 MG tablet TAKE ONE TABLET BY MOUTH EVERY EVENING 90 tablet 1   amLODipine  (NORVASC ) 10 MG tablet TAKE ONE TABLET BY MOUTH EVERY EVENING 90 tablet 1   chlorthalidone (HYGROTON) 25 MG tablet TAKE ONE TABLET BY MOUTH EVERY DAY 90 tablet 1   ELIQUIS  2.5 MG TABS tablet Take 1 tablet (2.5 mg total) by mouth 2 (two) times daily. 180 tablet 1   ezetimibe  (ZETIA ) 10 MG tablet Take 1 tablet (10 mg total) by mouth daily. 90 tablet 1   icosapent  Ethyl (VASCEPA ) 1 g capsule Take 2 capsules (2 g total) by mouth 2 (two) times daily. 360 capsule 1   KERENDIA  10 MG TABS TAKE ONE TABLET BY MOUTH EVERY DAY 30 tablet 3   levothyroxine  (SYNTHROID ) 112 MCG tablet TAKE ONE TABLET BY MOUTH EVERY MORNING 90 tablet 1   lisinopril  (ZESTRIL ) 20 MG tablet TAKE ONE TABLET BY MOUTH EVERY DAY 90 tablet 1   magnesium oxide (MAG-OX) 400 MG tablet Take 1 tablet (400 mg total) by mouth daily. 90 tablet 1   meclizine  (ANTIVERT ) 25 MG tablet Take 1 tablet (25 mg total) by mouth 3 (three) times daily as needed for dizziness. 30 tablet 2  metoCLOPramide (REGLAN) 5 MG tablet Take 5 mg by mouth 3 (three) times daily as needed.     metoprolol  succinate (TOPROL -XL) 100 MG 24 hr tablet Take 2 tablets (200 mg total) by mouth daily. 180 tablet 1   ondansetron  (ZOFRAN -ODT) 4 MG disintegrating tablet Take 1 tablet (4 mg total) by mouth every 8 (eight) hours as needed for nausea or vomiting. 20 tablet 0   pantoprazole  (PROTONIX ) 40 MG tablet TAKE ONE TABLET BY MOUTH EVERY EVENING 90 tablet 3   potassium chloride  (KLOR-CON ) 10 MEQ tablet TAKE ONE TABLET BY MOUTH EVERY DAY 90 tablet 1   promethazine  (PHENERGAN ) 25 MG tablet TAKE ONE TABLET BY MOUTH 4 TIMES DAILY AS NEEDED FOR NAUSEA AND VOMITING 60 tablet 3   rosuvastatin   (CRESTOR ) 40 MG tablet Take 1 tablet (40 mg total) by mouth every evening. 90 tablet 1   traMADol  (ULTRAM ) 50 MG tablet Take 1 tablet (50 mg total) by mouth every 8 (eight) hours as needed for severe pain (pain score 7-10) (back pain). 90 tablet 3   zolpidem  (AMBIEN  CR) 12.5 MG CR tablet TAKE ONE TABLET BY MOUTH AT BEDTIME 30 tablet 5   No current facility-administered medications on file prior to visit.   Past Medical History:  Diagnosis Date   Abdominal aortic atherosclerosis 03/02/2020   Age-related osteoporosis without current pathological fracture    Arthritis    Barrett's esophagus without dysplasia    Benign positional vertigo, bilateral 03/21/2023   Bilateral carpal tunnel syndrome 07/03/2018   Bilateral hand pain 07/03/2018   Cancer (HCC)    denies   Cervical spinal stenosis 03/06/2016   Chronic idiopathic gout involving toe of right foot without tophus 09/29/2020   Chronic kidney disease    Chronic pain syndrome 03/11/2022   Diabetes mellitus with stage 3 chronic kidney disease (HCC) 07/17/2021   Diabetic glomerulopathy (HCC) 12/16/2022   Exostosis 07/16/2017   Flu vaccine need 07/17/2021   Foot pain, bilateral 03/02/2020   GERD (gastroesophageal reflux disease)    Hearing loss    History of COVID-19    Hypercholesterolemia    Hypertension    Hypertensive chronic kidney disease with stage 1 through stage 4 chronic kidney disease, or unspecified chronic kidney disease    Hypertensive kidney disease with stage 4 chronic kidney disease (HCC) 12/16/2022   IBS (irritable bowel syndrome)    Low back pain    Lumbar pain 03/02/2020   Mixed hyperlipidemia    Paresthesia of skin    Paroxysmal atrial fibrillation (HCC) 04/28/2022   Post-surgical hypothyroidism 12/16/2022   Primary insomnia    Scars    Secondary DM with CKD stage 4 and hypertension (HCC) 12/16/2022   Telogen effluvium 03/02/2020   Vitamin D  deficiency, unspecified    Past Surgical History:  Procedure  Laterality Date   ANTERIOR CERVICAL DECOMP/DISCECTOMY FUSION N/A 03/06/2016   Procedure: ACDF - C5-C6 - C6-C7  ;  Surgeon: Victory Gunnels, MD;  Location: [facility];  Service: Neurosurgery;  Laterality: N/A;  ACDF - C5-C6 - C6-C7     APPENDECTOMY     BUNIONECTOMY     right and left   CARDIAC CATHETERIZATION     had in [place]  around 2012   CATARACT EXTRACTION W/ INTRAOCULAR LENS  IMPLANT, BILATERAL     CESAREAN SECTION     COLONOSCOPY W/ BIOPSIES AND POLYPECTOMY     MULTIPLE TOOTH EXTRACTIONS     TUBAL LIGATION      Family History  Problem Relation  Age of Onset   Heart attack Mother    Heart attack Father    Hypertension Sister    Hypertension Brother    Renal Disease Brother    Heart attack Brother    Multiple myeloma Brother    Hypertension Sister    Hypertension Sister    Heart attack Sister    Social History   Socioeconomic History   Marital status: Married    Spouse name: Not on file   Number of children: 3   Years of education: Not on file   Highest education level: Not on file  Occupational History   Occupation: Retired [employer]  Tobacco Use   Smoking status: Former    Current packs/day: 0.00    Types: Cigarettes    Quit date: 10/02/2015    Years since quitting: 8.9   Smokeless tobacco: Never  Vaping Use   Vaping status: Never Used  Substance and Sexual Activity   Alcohol use: No   Drug use: No   Sexual activity: Not on file  Other Topics Concern   Not on file  Social History Narrative   Not on file   Social Drivers of Health   Financial Resource Strain: Low Risk  (03/21/2023)   Overall Financial Resource Strain (CARDIA)    Difficulty of Paying Living Expenses: Not hard at all  Food Insecurity: No Food Insecurity (02/05/2024)   Hunger Vital Sign    Worried About Running Out of Food in the Last Year: Never true    Ran Out of Food in the Last Year: Never true  Transportation Needs: No Transportation Needs (02/05/2024)   PRAPARE - Scientist, Research (physical Sciences) (Medical): No    Lack of Transportation (Non-Medical): No  Physical Activity: Insufficiently Active (02/13/2024)   Exercise Vital Sign    Days of Exercise per Week: 4 days    Minutes of Exercise per Session: 30 min  Stress: No Stress Concern Present (02/13/2024)   [employer] of Occupational Health - Occupational Stress Questionnaire    Feeling of Stress : Not at all  Social Connections: Moderately Isolated (02/05/2024)   Social Connection and Isolation Panel    Frequency of Communication with Friends and Family: More than three times a week    Frequency of Social Gatherings with Friends and Family: More than three times a week    Attends Religious Services: Never    Database Administrator or Organizations: No    Attends Engineer, Structural: Not on file    Marital Status: Married    Objective:  BP 118/68   Pulse (!) 58   Temp 97.9 F (36.6 C)   Ht 5' 5 (1.651 m)   Wt 136 lb (61.7 kg)   SpO2 98%   BMI 22.63 kg/m      09/01/2024    8:09 AM 06/30/2024   11:23 AM 05/12/2024    8:22 AM  BP/Weight  Systolic BP 118 122 122  Diastolic BP 68 80 68  Wt. (Lbs) 136 144 144  BMI 22.63 kg/m2 23.96 kg/m2 23.96 kg/m2    Physical Exam  {Perform Simple Foot Exam  Perform Detailed exam:1} {Insert foot Exam (Optional):30965}   Lab Results  Component Value Date   WBC 7.8 09/01/2024   HGB 13.5 09/01/2024   HCT 41.4 09/01/2024   PLT 191 09/01/2024   GLUCOSE 83 09/01/2024   CHOL 219 (H) 05/12/2024   TRIG 178 (H) 05/12/2024   HDL 55 05/12/2024  LDLCALC 132 (H) 05/12/2024   ALT 18 09/01/2024   AST 20 09/01/2024   NA 145 (H) 09/01/2024   K 4.1 09/01/2024   CL 104 09/01/2024   CREATININE 1.51 (H) 09/01/2024   BUN 23 09/01/2024   CO2 24 09/01/2024   TSH 0.97 12/10/2023   HGBA1C 5.3 09/01/2024    Results for orders placed or performed in visit on 09/01/24  POCT Lipid Panel   Collection Time: 09/01/24  8:35 AM  Result Value Ref Range   TC 217     HDL 56    TRG 199    LDL 122    Non-HDL 161    TC/HDL    POCT glycosylated hemoglobin (Hb A1C)   Collection Time: 09/01/24  8:36 AM  Result Value Ref Range   Hemoglobin A1C     HbA1c POC (<> result, manual entry) 5.3 4.0 - 5.6 %   HbA1c, POC (prediabetic range)     HbA1c, POC (controlled diabetic range)    Comprehensive metabolic panel with GFR   Collection Time: 09/01/24  9:01 AM  Result Value Ref Range   Glucose 83 70 - 99 mg/dL   BUN 23 8 - 27 mg/dL   Creatinine, Ser 8.48 (H) 0.57 - 1.00 mg/dL   eGFR 35 (L) >40 fO/fpw/8.26   BUN/Creatinine Ratio 15 12 - 28   Sodium 145 (H) 134 - 144 mmol/L   Potassium 4.1 3.5 - 5.2 mmol/L   Chloride 104 96 - 106 mmol/L   CO2 24 20 - 29 mmol/L   Calcium  9.3 8.7 - 10.3 mg/dL   Total Protein 6.4 6.0 - 8.5 g/dL   Albumin 4.1 3.7 - 4.7 g/dL   Globulin, Total 2.3 1.5 - 4.5 g/dL   Bilirubin Total 0.2 0.0 - 1.2 mg/dL   Alkaline Phosphatase 153 (H) 48 - 129 IU/L   AST 20 0 - 40 IU/L   ALT 18 0 - 32 IU/L  CBC with Differential/Platelet   Collection Time: 09/01/24  9:01 AM  Result Value Ref Range   WBC 7.8 3.4 - 10.8 x10E3/uL   RBC 4.19 3.77 - 5.28 x10E6/uL   Hemoglobin 13.5 11.1 - 15.9 g/dL   Hematocrit 58.5 65.9 - 46.6 %   MCV 99 (H) 79 - 97 fL   MCH 32.2 26.6 - 33.0 pg   MCHC 32.6 31.5 - 35.7 g/dL   RDW 86.5 88.2 - 84.5 %   Platelets 191 150 - 450 x10E3/uL   Neutrophils 56 Not Estab. %   Lymphs 27 Not Estab. %   Monocytes 9 Not Estab. %   Eos 7 Not Estab. %   Basos 1 Not Estab. %   Neutrophils Absolute 4.4 1.4 - 7.0 x10E3/uL   Lymphocytes Absolute 2.1 0.7 - 3.1 x10E3/uL   Monocytes Absolute 0.7 0.1 - 0.9 x10E3/uL   EOS (ABSOLUTE) 0.6 (H) 0.0 - 0.4 x10E3/uL   Basophils Absolute 0.0 0.0 - 0.2 x10E3/uL   Immature Granulocytes 0 Not Estab. %   Immature Grans (Abs) 0.0 0.0 - 0.1 x10E3/uL  .  Assessment & Plan:   Assessment & Plan Mixed hyperlipidemia LDL cholesterol improved to 122 mg/dL. Triglycerides increased to 199 mg/dL.  Managed with Vascepa  and rosuvastatin . Discussed Repatha addition; she agreed to trial. - Provide a sample of Repatha for trial. - Send prescription for Repatha to [facility]. - Check for prior authorization for Repatha. Orders:   POCT Lipid Panel  Paroxysmal atrial fibrillation (HCC) On metoprolol  xl 100 mg 2 daily.  Hypertensive kidney disease with stage 4 chronic kidney disease (HCC) Well-controlled with lisinopril , Toprol  XL, amlodipine , chlorthalidone, and potassium. Blood pressure within target range.  Managed with Kerendia  10 mg daily. Orders:   Comprehensive metabolic panel with GFR   CBC with Differential/Platelet  Diabetic glomerulopathy (HCC) Control: good No medicines Recommend check feet daily. Recommend annual eye exams. Continue to work on eating a healthy diet and exercise.  Labs drawn today.  Managed with Kerendia  10 mg daily. Orders:   POCT glycosylated hemoglobin (Hb A1C)   Encounter for immunization  Orders:   Flu vaccine HIGH DOSE PF(Fluzone  Trivalent)  Encounter for immunization  Orders:   Pfizer Comirnaty Covid-19 Vaccine 72yrs & older   Body mass index is 22.63 kg/m.   Meds ordered this encounter  Medications   Evolocumab (REPATHA SURECLICK) 140 MG/ML SOAJ    Sig: Inject 140 mg into the skin every 14 (fourteen) days.    Dispense:  6 mL    Refill:  1    Orders Placed This Encounter  Procedures   Flu vaccine HIGH DOSE PF(Fluzone  Trivalent)   Pfizer Comirnaty Covid-19 Vaccine 35yrs & older   Comprehensive metabolic panel with GFR   CBC with Differential/Platelet   POCT glycosylated hemoglobin (Hb A1C)   POCT Lipid Panel     I,Marla I Leal-Borjas,acting as a scribe for Abigail Free, MD.,have documented all relevant documentation on the behalf of Abigail Free, MD,as directed by  Abigail Free, MD while in the presence of Abigail Free, MD.    Follow-up: No follow-ups on file.  An After Visit Summary was printed and given to the  patient.  Abigail Free, MD [facility] [phone]

## 2024-08-31 NOTE — Assessment & Plan Note (Addendum)
 On metoprolol  xl 100 mg 2 daily.

## 2024-09-01 ENCOUNTER — Ambulatory Visit: Admitting: Family Medicine

## 2024-09-01 ENCOUNTER — Encounter: Payer: Self-pay | Admitting: Family Medicine

## 2024-09-01 VITALS — BP 118/68 | HR 58 | Temp 97.9°F | Ht 65.0 in | Wt 136.0 lb

## 2024-09-01 DIAGNOSIS — E782 Mixed hyperlipidemia: Secondary | ICD-10-CM | POA: Diagnosis not present

## 2024-09-01 DIAGNOSIS — N184 Chronic kidney disease, stage 4 (severe): Secondary | ICD-10-CM | POA: Diagnosis not present

## 2024-09-01 DIAGNOSIS — Z23 Encounter for immunization: Secondary | ICD-10-CM | POA: Diagnosis not present

## 2024-09-01 DIAGNOSIS — E1121 Type 2 diabetes mellitus with diabetic nephropathy: Secondary | ICD-10-CM | POA: Diagnosis not present

## 2024-09-01 DIAGNOSIS — I129 Hypertensive chronic kidney disease with stage 1 through stage 4 chronic kidney disease, or unspecified chronic kidney disease: Secondary | ICD-10-CM

## 2024-09-01 DIAGNOSIS — I480 Paroxysmal atrial fibrillation: Secondary | ICD-10-CM

## 2024-09-01 LAB — POCT GLYCOSYLATED HEMOGLOBIN (HGB A1C): HbA1c POC (<> result, manual entry): 5.3 % (ref 4.0–5.6)

## 2024-09-01 LAB — POCT LIPID PANEL
HDL: 56
LDL: 122
Non-HDL: 161
TC: 217
TRG: 199

## 2024-09-01 MED ORDER — REPATHA SURECLICK 140 MG/ML ~~LOC~~ SOAJ: 140.0000 mg | SUBCUTANEOUS | 1 refills | Status: AC

## 2024-09-02 ENCOUNTER — Ambulatory Visit: Payer: Self-pay | Admitting: Family Medicine

## 2024-09-02 LAB — CBC WITH DIFFERENTIAL/PLATELET
Basophils Absolute: 0 x10E3/uL (ref 0.0–0.2)
Basos: 1 %
EOS (ABSOLUTE): 0.6 x10E3/uL — ABNORMAL HIGH (ref 0.0–0.4)
Eos: 7 %
Hematocrit: 41.4 % (ref 34.0–46.6)
Hemoglobin: 13.5 g/dL (ref 11.1–15.9)
Immature Grans (Abs): 0 x10E3/uL (ref 0.0–0.1)
Immature Granulocytes: 0 %
Lymphocytes Absolute: 2.1 x10E3/uL (ref 0.7–3.1)
Lymphs: 27 %
MCH: 32.2 pg (ref 26.6–33.0)
MCHC: 32.6 g/dL (ref 31.5–35.7)
MCV: 99 fL — ABNORMAL HIGH (ref 79–97)
Monocytes Absolute: 0.7 x10E3/uL (ref 0.1–0.9)
Monocytes: 9 %
Neutrophils Absolute: 4.4 x10E3/uL (ref 1.4–7.0)
Neutrophils: 56 %
Platelets: 191 x10E3/uL (ref 150–450)
RBC: 4.19 x10E6/uL (ref 3.77–5.28)
RDW: 13.4 % (ref 11.7–15.4)
WBC: 7.8 x10E3/uL (ref 3.4–10.8)

## 2024-09-02 LAB — COMPREHENSIVE METABOLIC PANEL WITH GFR
ALT: 18 IU/L (ref 0–32)
AST: 20 IU/L (ref 0–40)
Albumin: 4.1 g/dL (ref 3.7–4.7)
Alkaline Phosphatase: 153 IU/L — ABNORMAL HIGH (ref 48–129)
BUN/Creatinine Ratio: 15 (ref 12–28)
BUN: 23 mg/dL (ref 8–27)
Bilirubin Total: 0.2 mg/dL (ref 0.0–1.2)
CO2: 24 mmol/L (ref 20–29)
Calcium: 9.3 mg/dL (ref 8.7–10.3)
Chloride: 104 mmol/L (ref 96–106)
Creatinine, Ser: 1.51 mg/dL — ABNORMAL HIGH (ref 0.57–1.00)
Globulin, Total: 2.3 g/dL (ref 1.5–4.5)
Glucose: 83 mg/dL (ref 70–99)
Potassium: 4.1 mmol/L (ref 3.5–5.2)
Sodium: 145 mmol/L — ABNORMAL HIGH (ref 134–144)
Total Protein: 6.4 g/dL (ref 6.0–8.5)
eGFR: 35 mL/min/1.73 — ABNORMAL LOW (ref >59)

## 2024-10-13 ENCOUNTER — Other Ambulatory Visit: Payer: Self-pay | Admitting: Family Medicine

## 2024-10-27 ENCOUNTER — Other Ambulatory Visit: Payer: Self-pay | Admitting: Family Medicine

## 2024-10-27 DIAGNOSIS — M1A0710 Idiopathic chronic gout, right ankle and foot, without tophus (tophi): Secondary | ICD-10-CM

## 2024-11-11 ENCOUNTER — Other Ambulatory Visit: Payer: Self-pay | Admitting: Family Medicine

## 2024-11-25 ENCOUNTER — Other Ambulatory Visit: Payer: Self-pay | Admitting: Family Medicine

## 2024-12-10 ENCOUNTER — Other Ambulatory Visit: Payer: Self-pay | Admitting: Family Medicine

## 2024-12-12 ENCOUNTER — Other Ambulatory Visit: Payer: Self-pay | Admitting: Family Medicine

## 2024-12-14 ENCOUNTER — Telehealth: Payer: Self-pay

## 2024-12-14 ENCOUNTER — Other Ambulatory Visit (HOSPITAL_COMMUNITY): Payer: Self-pay

## 2024-12-14 NOTE — Telephone Encounter (Signed)
 Pharmacy Patient Advocate Encounter   Received notification from [facility] that prior authorization for Kerendia  is required/requested.   Insurance verification completed.   The patient is insured through [facility].   Per test claim: PA required; PA submitted to above mentioned insurance via Latent Key/confirmation #/EOC [id number] Status is pending

## 2024-12-15 ENCOUNTER — Ambulatory Visit: Admitting: Family Medicine

## 2024-12-15 ENCOUNTER — Other Ambulatory Visit (HOSPITAL_COMMUNITY): Payer: Self-pay

## 2024-12-15 NOTE — Telephone Encounter (Signed)
 Pharmacy Patient Advocate Encounter  Received notification from [facility] that Prior Authorization for Kerendia  has been APPROVED from 12/14/2024 to 12/14/2025. Ran test claim, Copay is $Refill too Soon. This test claim was processed through [facility]- copay amounts may vary at other pharmacies due to pharmacy/plan contracts, or as the patient moves through the different stages of their insurance plan.   PA #/Case ID/Reference #: [id number]

## 2024-12-22 ENCOUNTER — Ambulatory Visit: Admitting: Family Medicine

## 2025-02-17 ENCOUNTER — Ambulatory Visit
